# Patient Record
Sex: Female | Born: 1955 | Race: Black or African American | Hispanic: No | Smoking: Current every day smoker
Health system: Southern US, Community
[De-identification: ages and names within clinical notes are randomized; demographics above are authoritative.]

## PROBLEM LIST (undated history)

## (undated) DIAGNOSIS — D649 Anemia, unspecified: Secondary | ICD-10-CM

## (undated) DIAGNOSIS — R0600 Dyspnea, unspecified: Secondary | ICD-10-CM

## (undated) DIAGNOSIS — M1990 Unspecified osteoarthritis, unspecified site: Secondary | ICD-10-CM

## (undated) DIAGNOSIS — E119 Type 2 diabetes mellitus without complications: Secondary | ICD-10-CM

## (undated) DIAGNOSIS — I10 Essential (primary) hypertension: Secondary | ICD-10-CM

## (undated) DIAGNOSIS — E7800 Pure hypercholesterolemia, unspecified: Secondary | ICD-10-CM

## (undated) DIAGNOSIS — K759 Inflammatory liver disease, unspecified: Secondary | ICD-10-CM

## (undated) DIAGNOSIS — K219 Gastro-esophageal reflux disease without esophagitis: Secondary | ICD-10-CM

## (undated) DIAGNOSIS — T7840XA Allergy, unspecified, initial encounter: Secondary | ICD-10-CM

## (undated) DIAGNOSIS — Z0282 Encounter for adoption services: Secondary | ICD-10-CM

## (undated) DIAGNOSIS — J45909 Unspecified asthma, uncomplicated: Secondary | ICD-10-CM

## (undated) DIAGNOSIS — Z789 Other specified health status: Secondary | ICD-10-CM

## (undated) DIAGNOSIS — E213 Hyperparathyroidism, unspecified: Secondary | ICD-10-CM

## (undated) HISTORY — DX: Allergy, unspecified, initial encounter: T78.40XA

## (undated) HISTORY — PX: TUBAL LIGATION: SHX77

---

## 2017-05-27 DIAGNOSIS — K219 Gastro-esophageal reflux disease without esophagitis: Secondary | ICD-10-CM | POA: Insufficient documentation

## 2017-05-27 DIAGNOSIS — E66811 Obesity, class 1: Secondary | ICD-10-CM | POA: Insufficient documentation

## 2017-05-27 DIAGNOSIS — E1169 Type 2 diabetes mellitus with other specified complication: Secondary | ICD-10-CM | POA: Insufficient documentation

## 2017-05-27 DIAGNOSIS — Z6833 Body mass index (BMI) 33.0-33.9, adult: Secondary | ICD-10-CM | POA: Insufficient documentation

## 2017-05-27 DIAGNOSIS — E669 Obesity, unspecified: Secondary | ICD-10-CM | POA: Insufficient documentation

## 2017-05-27 DIAGNOSIS — I10 Essential (primary) hypertension: Secondary | ICD-10-CM | POA: Insufficient documentation

## 2017-05-27 DIAGNOSIS — J411 Mucopurulent chronic bronchitis: Secondary | ICD-10-CM | POA: Insufficient documentation

## 2017-05-27 DIAGNOSIS — E119 Type 2 diabetes mellitus without complications: Secondary | ICD-10-CM | POA: Insufficient documentation

## 2017-07-12 ENCOUNTER — Other Ambulatory Visit: Payer: Self-pay

## 2017-07-12 ENCOUNTER — Encounter (HOSPITAL_COMMUNITY): Payer: Self-pay | Admitting: *Deleted

## 2017-07-12 ENCOUNTER — Ambulatory Visit (INDEPENDENT_AMBULATORY_CARE_PROVIDER_SITE_OTHER): Payer: Self-pay

## 2017-07-12 ENCOUNTER — Ambulatory Visit (HOSPITAL_COMMUNITY)
Admission: EM | Admit: 2017-07-12 | Discharge: 2017-07-12 | Disposition: A | Discharge status: Home / Self Care | Payer: Self-pay | Attending: Internal Medicine | Admitting: Internal Medicine

## 2017-07-12 DIAGNOSIS — R05 Cough: Secondary | ICD-10-CM

## 2017-07-12 DIAGNOSIS — R0602 Shortness of breath: Secondary | ICD-10-CM

## 2017-07-12 DIAGNOSIS — R0789 Other chest pain: Secondary | ICD-10-CM

## 2017-07-12 HISTORY — DX: Essential (primary) hypertension: I10

## 2017-07-12 HISTORY — DX: Unspecified asthma, uncomplicated: J45.909

## 2017-07-12 HISTORY — DX: Type 2 diabetes mellitus without complications: E11.9

## 2017-07-12 HISTORY — DX: Pure hypercholesterolemia, unspecified: E78.00

## 2017-07-12 MED ORDER — PREDNISONE 50 MG PO TABS: 50.0000 mg | ORAL_TABLET | Freq: Every day | ORAL | 0 refills | Status: DC

## 2017-07-12 MED ORDER — PREDNISONE 50 MG PO TABS: 50.0000 mg | ORAL_TABLET | Freq: Every day | ORAL | 0 refills | Status: AC

## 2017-07-12 MED ORDER — IPRATROPIUM-ALBUTEROL 0.5-2.5 (3) MG/3ML IN SOLN
RESPIRATORY_TRACT | Status: AC
  Filled 2017-07-12: qty 3

## 2017-07-12 MED ORDER — IPRATROPIUM-ALBUTEROL 0.5-2.5 (3) MG/3ML IN SOLN
3.0000 mL | Freq: Once | RESPIRATORY_TRACT | Status: AC
  Administered 2017-07-12: 3 mL via RESPIRATORY_TRACT

## 2017-07-12 MED ORDER — LEVOFLOXACIN 750 MG PO TABS: 750.0000 mg | ORAL_TABLET | Freq: Every day | ORAL | 0 refills | Status: DC

## 2017-07-12 MED ORDER — DOXYCYCLINE HYCLATE 100 MG PO CAPS: 100.0000 mg | ORAL_CAPSULE | Freq: Two times a day (BID) | ORAL | 0 refills | Status: DC

## 2017-07-12 NOTE — Discharge Instructions (Signed)
X-ray shows changes in your lungs that could be starting pneumonia.  Start doxycycline and prednisone as directed.  Continue albuterol treatment as directed.  If experiencing worsening symptoms, chest pain, more short of breath, leaning forward to breathe, weakness, dizziness, feeling like passing out, go to the emergency department for further evaluation.

## 2017-07-12 NOTE — ED Provider Notes (Signed)
[place]    CSN: [id number] Arrival date & time: 07/12/17  1740     History   Chief Complaint Chief Complaint  Patient presents with  . Shortness of Breath  . Cough    HPI Megan [name] is a 62 y.o. female.   62 year old female with history of asthma, DM, HLD, HTN comes in for 3-day history of cough, chest pain with cough, shortness of breath, poor appetite.  Has had body aches.  Rhinorrhea, nasal congestion.  Denies fever, chills, night sweats.  She has been trying to use her albuterol inhaler, but states she has not been able to take a deep enough breath to sufficiently get the medicine.  Denies chest pain at rest.  Denies swelling of the leg, orthopnea.  Denies current chest pain.  She had grunting breathing at triage, DuoNeb was started.  States symptoms has significantly improved.  She is a current everyday smoker for the past 30 years, 0.3-0.5 pack/day.     Past Medical History:  Diagnosis Date  . Asthma   . Diabetes mellitus without complication ([place])   . Hypercholesteremia   . Hypertension     There are no active problems to display for this patient.   History reviewed. No pertinent surgical history.  OB History   None      Home Medications    Prior to Admission medications   Medication Sig Start Date End Date Taking? Authorizing Provider  albuterol (PROVENTIL HFA;VENTOLIN HFA) 108 (90 Base) MCG/ACT inhaler Inhale into the lungs every 6 (six) hours as needed for wheezing or shortness of breath.   Yes [provider]  aspirin 81 MG chewable tablet Chew by mouth daily.   Yes [provider]  GLIMEPIRIDE PO Take by mouth.   Yes [provider]  UNKNOWN TO PATIENT HTN med and a couple I cannot think of   Yes [provider]  doxycycline (VIBRAMYCIN) 100 MG capsule Take 1 capsule (100 mg total) by mouth 2 (two) times daily. 07/12/17   Tasia Catchings, Brynnleigh Mcelwee V, PA-C  predniSONE (DELTASONE) 50 MG tablet Take 1 tablet (50 mg  total) by mouth daily for 5 days. 07/12/17 07/17/17  Ok Edwards, PA-C    Family History Family History  Problem Relation Age of Onset  . Cancer Mother   . Cancer Father   . Heart attack Father     Social History Social History   Tobacco Use  . Smoking status: Current Every Day Smoker    Types: Cigarettes  . Smokeless tobacco: Never Used  Substance Use Topics  . Alcohol use: Yes    Alcohol/week: 8.4 oz    Types: 14 Shots of liquor per week  . Drug use: Never     Allergies   Peanut butter flavor   Review of Systems Review of Systems  Reason unable to perform ROS: See HPI as above.     Physical Exam Triage Vital Signs ED Triage Vitals [07/12/17 1746]  Enc Vitals Group     BP (!) 170/101     Pulse Rate (!) 119     Resp (!) 28     Temp 98.1 F (36.7 C)     Temp Source Oral     SpO2 95 %     Weight      Height      Head Circumference      Peak Flow      Pain Score 5     Pain Loc  Pain Edu?      Excl. in [place]?    No data found.  Updated Vital Signs BP 110/68   Pulse (!) 120   Temp 98.1 F (36.7 C) (Oral)   Resp (!) 24   SpO2 94%   Physical Exam  Constitutional: She is oriented to person, place, and time. She appears well-developed and well-nourished.  Non-toxic appearance. She does not appear ill. No distress.  HENT:  Head: Normocephalic and atraumatic.  Right Ear: Tympanic membrane, external ear and ear canal normal. Tympanic membrane is not erythematous and not bulging.  Left Ear: Tympanic membrane, external ear and ear canal normal. Tympanic membrane is not erythematous and not bulging.  Nose: Nose normal. Right sinus exhibits no maxillary sinus tenderness and no frontal sinus tenderness. Left sinus exhibits no maxillary sinus tenderness and no frontal sinus tenderness.  Mouth/Throat: Uvula is midline, oropharynx is clear and moist and mucous membranes are normal.  Eyes: Pupils are equal, round, and reactive to light. Conjunctivae are normal.    Neck: Normal range of motion. Neck supple.  Cardiovascular: Normal rate, regular rhythm and normal heart sounds. Exam reveals no gallop and no friction rub.  No murmur heard. Pulmonary/Chest: Effort normal. No accessory muscle usage. No respiratory distress.  Short shallow breaths without obvious wheezing, rales, rhonchi.  Musculoskeletal:       Right lower leg: She exhibits no edema.       Left lower leg: She exhibits no edema.  Lymphadenopathy:    She has no cervical adenopathy.  Neurological: She is alert and oriented to person, place, and time.  Skin: Skin is warm and dry.  Psychiatric: She has a normal mood and affect. Her behavior is normal. Judgment normal.     UC Treatments / Results  Labs (all labs ordered are listed, but only abnormal results are displayed) Labs Reviewed - No data to display  EKG None Radiology Dg Chest 2 View  Result Date: 07/12/2017 CLINICAL DATA:  Patient with productive crawl. Green sputum. Shortness of breath. EXAM: CHEST - 2 VIEW COMPARISON:  None. FINDINGS: Cardiac contours upper limits of normal. Tortuosity of the thoracic aorta. Bibasilar heterogeneous pulmonary opacities. No pleural effusion or pneumothorax. Thoracic spine degenerative changes. IMPRESSION: Basilar heterogeneous opacities favored to represent atelectasis. Infection not excluded. Electronically Signed   By: Lovey Newcomer M.D.   On: 07/12/2017 18:51    Procedures Procedures (including critical care time)  Medications Ordered in UC Medications  ipratropium-albuterol (DUONEB) 0.5-2.5 (3) MG/3ML nebulizer solution 3 mL (3 mLs Nebulization Given 07/12/17 1759)  ipratropium-albuterol (DUONEB) 0.5-2.5 (3) MG/3ML nebulizer solution 3 mL (3 mLs Nebulization Given 07/12/17 1846)     Initial Impression / Assessment and Plan / UC Course  I have reviewed the triage vital signs and the nursing notes.  Pertinent labs & imaging results that were available during my care of the patient were  reviewed by me and considered in my medical decision making (see chart for details).    Chest x-ray results discussed with patient.  Patient with much improved symptoms after 2 rounds of DuoNeb.  Deeper breathing, lungs remain clear to auscultation bilaterally without obvious adventitious lung sounds.  Patient speaking in full sentences without any accessory muscle use.  Patient continues to be tachycardic and slightly tachypneic, although with improvement of tachypnea after DuoNeb.  Discussed possible continued tachycardia with DuoNeb.  EKG shows sinus tachycardia without obvious ST changes.  No prior EKGs to compare to.  Patient currently without chest pain.  Given smoking history, will treat for COPD exacerbation with doxycycline and prednisone.  Continue albuterol treatments at home.  Strict return precautions given.  Patient expresses understanding and agrees to plan.  Final Clinical Impressions(s) / UC Diagnoses   Final diagnoses:  Shortness of breath    ED Discharge Orders        Ordered    levofloxacin (LEVAQUIN) 750 MG tablet  Daily,   Status:  Discontinued     07/12/17 1908    doxycycline (VIBRAMYCIN) 100 MG capsule  2 times daily,   Status:  Discontinued     07/12/17 1911    predniSONE (DELTASONE) 50 MG tablet  Daily,   Status:  Discontinued     07/12/17 1911    doxycycline (VIBRAMYCIN) 100 MG capsule  2 times daily     07/12/17 1916    predniSONE (DELTASONE) 50 MG tablet  Daily     07/12/17 1916        Ok Edwards, Hershal Coria 07/12/17 2038

## 2017-07-12 NOTE — ED Notes (Signed)
Pt continues with grunting breathing, but states her breathing feels much better.  Pt spitting up large amounts phlegm.

## 2017-07-12 NOTE — ED Triage Notes (Signed)
C/O cough, chest pain, SOB, poor appetite x 2.5 days ago.  C/o body aches.  Pt with grunting breathing.

## 2018-01-19 DIAGNOSIS — F1010 Alcohol abuse, uncomplicated: Secondary | ICD-10-CM | POA: Insufficient documentation

## 2018-01-19 DIAGNOSIS — E782 Mixed hyperlipidemia: Secondary | ICD-10-CM | POA: Insufficient documentation

## 2018-01-19 DIAGNOSIS — E213 Hyperparathyroidism, unspecified: Secondary | ICD-10-CM | POA: Insufficient documentation

## 2018-03-11 ENCOUNTER — Encounter (HOSPITAL_COMMUNITY): Payer: Self-pay

## 2018-03-11 ENCOUNTER — Ambulatory Visit (HOSPITAL_COMMUNITY)
Admission: EM | Admit: 2018-03-11 | Discharge: 2018-03-11 | Disposition: A | Discharge status: Home / Self Care | Payer: Self-pay | Attending: Family Medicine | Admitting: Family Medicine

## 2018-03-11 ENCOUNTER — Ambulatory Visit (INDEPENDENT_AMBULATORY_CARE_PROVIDER_SITE_OTHER): Payer: Self-pay

## 2018-03-11 DIAGNOSIS — R05 Cough: Secondary | ICD-10-CM

## 2018-03-11 DIAGNOSIS — F17210 Nicotine dependence, cigarettes, uncomplicated: Secondary | ICD-10-CM

## 2018-03-11 DIAGNOSIS — J441 Chronic obstructive pulmonary disease with (acute) exacerbation: Secondary | ICD-10-CM | POA: Insufficient documentation

## 2018-03-11 DIAGNOSIS — R062 Wheezing: Secondary | ICD-10-CM

## 2018-03-11 MED ORDER — HYDROCODONE-HOMATROPINE 5-1.5 MG/5ML PO SYRP: 5.0000 mL | ORAL_SOLUTION | Freq: Four times a day (QID) | ORAL | 0 refills | Status: DC | PRN

## 2018-03-11 MED ORDER — PREDNISONE 50 MG PO TABS: ORAL_TABLET | ORAL | 1 refills | Status: DC

## 2018-03-11 MED ORDER — IPRATROPIUM-ALBUTEROL 0.5-2.5 (3) MG/3ML IN SOLN
RESPIRATORY_TRACT | Status: AC
  Filled 2018-03-11: qty 3

## 2018-03-11 MED ORDER — IPRATROPIUM-ALBUTEROL 0.5-2.5 (3) MG/3ML IN SOLN
3.0000 mL | Freq: Once | RESPIRATORY_TRACT | Status: AC
  Administered 2018-03-11: 3 mL via RESPIRATORY_TRACT

## 2018-03-11 MED ORDER — LIDOCAINE HCL (PF) 1 % IJ SOLN
INTRAMUSCULAR | Status: AC
Start: 2018-03-11 — End: 2018-03-11
  Filled 2018-03-11: qty 2

## 2018-03-11 MED ORDER — IPRATROPIUM-ALBUTEROL 0.5-2.5 (3) MG/3ML IN SOLN: 3.0000 mL | RESPIRATORY_TRACT | 1 refills | Status: DC | PRN

## 2018-03-11 MED ORDER — CEFTRIAXONE SODIUM 1 G IJ SOLR
1.0000 g | Freq: Once | INTRAMUSCULAR | Status: AC
  Administered 2018-03-11: 1 g via INTRAMUSCULAR

## 2018-03-11 MED ORDER — CEFTRIAXONE SODIUM 1 G IJ SOLR
INTRAMUSCULAR | Status: AC
  Filled 2018-03-11: qty 10

## 2018-03-11 MED ORDER — AMOXICILLIN 875 MG PO TABS: 875.0000 mg | ORAL_TABLET | Freq: Two times a day (BID) | ORAL | 0 refills | Status: DC

## 2018-03-11 NOTE — ED Triage Notes (Signed)
Pt presents with persistent productive cough, with some back pain and rib pain.

## 2018-03-11 NOTE — ED Notes (Signed)
Patient able to ambulate independently at baseline  

## 2018-03-11 NOTE — Discharge Instructions (Signed)
You've smoked your last cigarette.  Give your lungs a chance to work.

## 2018-03-11 NOTE — ED Notes (Signed)
Returned from xray

## 2018-03-11 NOTE — ED Provider Notes (Signed)
[place]    CSN: [id number] Arrival date & time: 03/11/18  1553     History   Chief Complaint Chief Complaint  Patient presents with  . Cough  . Back Pain  . Fatigue    HPI Megan [name] is a 62 y.o. female.   This is a 62 year old woman who comes to the [facility] urgent care has an established patient with complaints of persistent productive cough, with some back pain and rib pain.  She is short of breath.  She is a smoker.  She is visiting her daughter from [place].  Patient has had neither flu shot no pneumonia shot.     Past Medical History:  Diagnosis Date  . Asthma   . Diabetes mellitus without complication ([place])   . Hypercholesteremia   . Hypertension     There are no active problems to display for this patient.   History reviewed. No pertinent surgical history.  OB History   No obstetric history on file.      Home Medications    Prior to Admission medications   Medication Sig Start Date End Date Taking? Authorizing Provider  albuterol (PROVENTIL HFA;VENTOLIN HFA) 108 (90 Base) MCG/ACT inhaler Inhale into the lungs every 6 (six) hours as needed for wheezing or shortness of breath.    [provider]  amoxicillin (AMOXIL) 875 MG tablet Take 1 tablet (875 mg total) by mouth 2 (two) times daily. 03/11/18   Robyn Haber, MD  aspirin 81 MG chewable tablet Chew by mouth daily.    [provider]  GLIMEPIRIDE PO Take by mouth.    [provider]  HYDROcodone-homatropine (HYDROMET) 5-1.5 MG/5ML syrup Take 5 mLs by mouth every 6 (six) hours as needed for cough. 03/11/18   Robyn Haber, MD  ipratropium-albuterol (DUONEB) 0.5-2.5 (3) MG/3ML SOLN Take 3 mLs by nebulization every 4 (four) hours as needed. 03/11/18   Robyn Haber, MD  predniSONE (DELTASONE) 50 MG tablet One daily with food 03/11/18   Robyn Haber, MD  UNKNOWN TO PATIENT HTN med and a couple I cannot think of    [provider]      Family History Family History  Problem Relation Age of Onset  . Cancer Mother   . Cancer Father   . Heart attack Father     Social History Social History   Tobacco Use  . Smoking status: Current Every Day Smoker    Types: Cigarettes  . Smokeless tobacco: Never Used  Substance Use Topics  . Alcohol use: Yes    Alcohol/week: 14.0 standard drinks    Types: 14 Shots of liquor per week  . Drug use: Never     Allergies   Peanut butter flavor   Review of Systems Review of Systems   Physical Exam Triage Vital Signs ED Triage Vitals  Enc Vitals Group     BP 03/11/18 1716 119/79     Pulse Rate 03/11/18 1716 (!) 105     Resp 03/11/18 1716 18     Temp 03/11/18 1716 98.4 F (36.9 C)     Temp Source 03/11/18 1716 Oral     SpO2 03/11/18 1716 92 %     Weight --      Height --      Head Circumference --      Peak Flow --      Pain Score 03/11/18 1715 8     Pain Loc --      Pain Edu? --  Excl. in [place]? --    No data found.  Updated Vital Signs BP 119/79 (BP Location: Right Arm)   Pulse (!) 105   Temp 98.4 F (36.9 C) (Oral)   Resp 18   SpO2 92%   Physical Exam Vitals signs and nursing note reviewed.  Constitutional:      General: She is in acute distress.     Appearance: Normal appearance. She is obese. She is ill-appearing.  HENT:     Head: Normocephalic.     Right Ear: Tympanic membrane and external ear normal.     Left Ear: Tympanic membrane and external ear normal.     Nose: Nose normal.     Mouth/Throat:     Mouth: Mucous membranes are moist.  Eyes:     Conjunctiva/sclera: Conjunctivae normal.     Pupils: Pupils are equal, round, and reactive to light.  Neck:     Musculoskeletal: Normal range of motion and neck supple.  Cardiovascular:     Rate and Rhythm: Tachycardia present.     Heart sounds: Normal heart sounds.  Pulmonary:     Effort: Respiratory distress present.     Breath sounds: Wheezing, rhonchi and rales present.   Musculoskeletal: Normal range of motion.  Skin:    General: Skin is warm and dry.  Neurological:     General: No focal deficit present.     Mental Status: She is alert and oriented to person, place, and time.  Psychiatric:        Mood and Affect: Mood normal.      UC Treatments / Results  Labs (all labs ordered are listed, but only abnormal results are displayed) Labs Reviewed - No data to display  EKG None  Radiology No results found.  Procedures Procedures (including critical care time)  Medications Ordered in UC Medications  ipratropium-albuterol (DUONEB) 0.5-2.5 (3) MG/3ML nebulizer solution 3 mL (has no administration in time range)  cefTRIAXone (ROCEPHIN) injection 1 g (has no administration in time range)    Initial Impression / Assessment and Plan / UC Course  I have reviewed the triage vital signs and the nursing notes.  Pertinent labs & imaging results that were available during my care of the patient were reviewed by me and considered in my medical decision making (see chart for details).    Final Clinical Impressions(s) / UC Diagnoses   Final diagnoses:  COPD exacerbation [facility])   Discharge Instructions   None    ED Prescriptions    Medication Sig Dispense Auth. Provider   predniSONE (DELTASONE) 50 MG tablet One daily with food 5 tablet Robyn Haber, MD   HYDROcodone-homatropine (HYDROMET) 5-1.5 MG/5ML syrup Take 5 mLs by mouth every 6 (six) hours as needed for cough. 60 mL Robyn Haber, MD   ipratropium-albuterol (DUONEB) 0.5-2.5 (3) MG/3ML SOLN Take 3 mLs by nebulization every 4 (four) hours as needed. 360 mL Robyn Haber, MD   amoxicillin (AMOXIL) 875 MG tablet Take 1 tablet (875 mg total) by mouth 2 (two) times daily. 20 tablet Robyn Haber, MD     Controlled Substance Prescriptions [place] Controlled Substance Registry consulted? Not Applicable   Robyn Haber, MD 03/11/18 1750

## 2019-03-18 DIAGNOSIS — M545 Low back pain, unspecified: Secondary | ICD-10-CM | POA: Insufficient documentation

## 2019-11-14 ENCOUNTER — Telehealth: Payer: Self-pay

## 2019-11-14 NOTE — Telephone Encounter (Signed)
Pt left v/m that she has appt on 12/26/19 to establish care with Glenda Chroman FNP. Pt request cb with address for [facility]. Gave pt [place] [phone]. Nothing further needed at this time.

## 2019-12-12 LAB — HEMOGLOBIN A1C: Hemoglobin A1C: 5.8

## 2019-12-12 LAB — MICROALBUMIN, URINE: Microalb, Ur: NEGATIVE

## 2019-12-26 ENCOUNTER — Ambulatory Visit: Payer: Self-pay | Admitting: Family Medicine

## 2020-02-15 ENCOUNTER — Ambulatory Visit (INDEPENDENT_AMBULATORY_CARE_PROVIDER_SITE_OTHER): Payer: Medicare Other | Admitting: Family Medicine

## 2020-02-15 ENCOUNTER — Other Ambulatory Visit: Payer: Self-pay

## 2020-02-15 ENCOUNTER — Encounter: Payer: Self-pay | Admitting: Family Medicine

## 2020-02-15 VITALS — BP 138/74 | HR 102 | Temp 98.5°F | Ht 66.0 in | Wt 184.4 lb

## 2020-02-15 DIAGNOSIS — R7989 Other specified abnormal findings of blood chemistry: Secondary | ICD-10-CM

## 2020-02-15 DIAGNOSIS — F419 Anxiety disorder, unspecified: Secondary | ICD-10-CM

## 2020-02-15 DIAGNOSIS — Z114 Encounter for screening for human immunodeficiency virus [HIV]: Secondary | ICD-10-CM

## 2020-02-15 DIAGNOSIS — Z1211 Encounter for screening for malignant neoplasm of colon: Secondary | ICD-10-CM

## 2020-02-15 DIAGNOSIS — Z7689 Persons encountering health services in other specified circumstances: Secondary | ICD-10-CM

## 2020-02-15 DIAGNOSIS — F1010 Alcohol abuse, uncomplicated: Secondary | ICD-10-CM

## 2020-02-15 DIAGNOSIS — E7800 Pure hypercholesterolemia, unspecified: Secondary | ICD-10-CM

## 2020-02-15 DIAGNOSIS — F32A Depression, unspecified: Secondary | ICD-10-CM

## 2020-02-15 DIAGNOSIS — E2839 Other primary ovarian failure: Secondary | ICD-10-CM

## 2020-02-15 DIAGNOSIS — E119 Type 2 diabetes mellitus without complications: Secondary | ICD-10-CM

## 2020-02-15 LAB — COMPREHENSIVE METABOLIC PANEL
ALT: 40 U/L — ABNORMAL HIGH (ref 0–35)
AST: 104 U/L — ABNORMAL HIGH (ref 0–37)
Albumin: 4.2 g/dL (ref 3.5–5.2)
Alkaline Phosphatase: 367 U/L — ABNORMAL HIGH (ref 39–117)
BUN: 10 mg/dL (ref 6–23)
CO2: 29 mEq/L (ref 19–32)
Calcium: 12 mg/dL — ABNORMAL HIGH (ref 8.4–10.5)
Chloride: 96 mEq/L (ref 96–112)
Creatinine, Ser: 0.75 mg/dL (ref 0.40–1.20)
GFR: 83.55 mL/min (ref >60.00)
Glucose, Bld: 218 mg/dL — ABNORMAL HIGH (ref 70–99)
Potassium: 4 mEq/L (ref 3.5–5.1)
Sodium: 134 mEq/L — ABNORMAL LOW (ref 135–145)
Total Bilirubin: 0.7 mg/dL (ref 0.2–1.2)
Total Protein: 8.6 g/dL — ABNORMAL HIGH (ref 6.0–8.3)

## 2020-02-15 LAB — CBC WITH DIFFERENTIAL/PLATELET
Basophils Absolute: 0.1 10*3/uL (ref 0.0–0.1)
Basophils Relative: 0.7 % (ref 0.0–3.0)
Eosinophils Absolute: 0.1 10*3/uL (ref 0.0–0.7)
Eosinophils Relative: 0.5 % (ref 0.0–5.0)
HCT: 38.4 % (ref 36.0–46.0)
Hemoglobin: 12.8 g/dL (ref 12.0–15.0)
Lymphocytes Relative: 17.4 % (ref 12.0–46.0)
Lymphs Abs: 2 10*3/uL (ref 0.7–4.0)
MCHC: 33.4 g/dL (ref 30.0–36.0)
MCV: 108.8 fl — ABNORMAL HIGH (ref 78.0–100.0)
Monocytes Absolute: 0.7 10*3/uL (ref 0.1–1.0)
Monocytes Relative: 6.2 % (ref 3.0–12.0)
Neutro Abs: 8.8 10*3/uL — ABNORMAL HIGH (ref 1.4–7.7)
Neutrophils Relative %: 75.2 % (ref 43.0–77.0)
Platelets: 272 10*3/uL (ref 150.0–400.0)
RBC: 3.53 Mil/uL — ABNORMAL LOW (ref 3.87–5.11)
RDW: 15.5 % (ref 11.5–15.5)
WBC: 11.7 10*3/uL — ABNORMAL HIGH (ref 4.0–10.5)

## 2020-02-15 LAB — LIPID PANEL
Cholesterol: 202 mg/dL — ABNORMAL HIGH (ref 0–200)
HDL: 72.9 mg/dL (ref >39.00)
LDL Cholesterol: 91 mg/dL (ref 0–99)
NonHDL: 129.5
Total CHOL/HDL Ratio: 3
Triglycerides: 191 mg/dL — ABNORMAL HIGH (ref 0.0–149.0)
VLDL: 38.2 mg/dL (ref 0.0–40.0)

## 2020-02-15 MED ORDER — DAPAGLIFLOZIN PROPANEDIOL 5 MG PO TABS: 5.0000 mg | ORAL_TABLET | Freq: Every day | ORAL | 1 refills | Status: DC

## 2020-02-15 NOTE — Progress Notes (Signed)
Subjective:    Patient ID: Megan [name], female    DOB: [birth year], 64 y.o.   MRN: [id number]  HPI Chief Complaint  Patient presents with  . New Patient (Initial Visit)    \   This is a 64 yo female who presents today to establish care. She is not working, lives with her husband, daughter, SIL, grandson. Enjoys scratch offs, watching comedies, reading.   Last CPE- more than 1 year ago Mammo- 01/25/2019 Pap- 07/28/2017 Colonoscopy- never, has done stool studies Tdap- unknown, declines in office due to non coverage Flu- Covid 19 vaccine- vacinated Eye- overdue Dental- overdue Exercise- none ETOH- drinking daily, over a pint of vodka daily. History of substance abuse, stopped 12 years ago. Husband is an alcoholic. Unhappy in her marriage. Went to rehab for crack cocaine. Lost her jobs. Depressed more than anxious. Denies physical or emotional abuse.  Sleep- poor sleep, goes to bed early, wakes up early, mind racing. Naps throughout the day.  Tobacco- 1/3 ppd    Review of Systems Denies chest pain, SOB, abdominal pain, blood in BM or urine, dysuria, frequency, leg swelling    Objective:   Physical Exam Physical Exam  Constitutional: Oriented to person, place, and time. Appears well-developed and well-nourished.  HENT:  Head: Normocephalic and atraumatic.  Eyes: Conjunctivae are normal.  Neck: Normal range of motion. Neck supple.  Cardiovascular: Normal rate, regular rhythm and normal heart sounds.   Pulmonary/Chest: Effort normal and breath sounds normal.  Musculoskeletal: No lower extremity edema.   Neurological: Alert and oriented to person, place, and time.  Skin: Skin is warm and dry.  Psychiatric: Normal mood and affect. Behavior is normal. Judgment and thought content normal.  Vitals reviewed.   BP 138/74 (BP Location: Right Arm, Patient Position: Sitting, Cuff Size: Normal)   Pulse (!) 102   Temp 98.5 F (36.9 C) (Oral)   Ht 5\' 6"  (1.676 m)   Wt 184 lb 6.4  oz (83.6 kg)   SpO2 96%   BMI 29.76 kg/m  Depression screen [facility] 2/9 02/15/2020 02/15/2020  Decreased Interest 3 3  Down, Depressed, Hopeless 3 -  PHQ - 2 Score 6 3  Altered sleeping 3 -  Tired, decreased energy 2 2  Change in appetite 2 2  Feeling bad or failure about yourself  3 -  Trouble concentrating 0 -  Moving slowly or fidgety/restless 0 -  Suicidal thoughts 0 -  PHQ-9 Score 16 -       Assessment & Plan:  1. Encounter to establish care -Reviewed available records in EMR -Discussed overdue health maintenance.  She declines Tdap, pneumonia, flu vaccines today. -Provided her information to schedule bone density and mammogram  2. Anxiety and depression -Discussed patient's current situation with her and offered resources, referral to therapy, medication.  She was not interested in any of these at this time.  3. Alcohol abuse -Discussed her current alcohol usage and excessive nature.  Offered referral and resources.  She is interested in stopping alcohol abuse on her own.  Advised her to do this with medical supervision as stopping abruptly could be dangerous.  She is still in contemplative stage and has not come up with a plan to reduce usage or stop at this point.  Provided her with address of [facility] mental health urgent care in [place]  4. Type 2 diabetes mellitus without complication, without long-term current use of insulin (HCC) -Unfortunately, I did not get hemoglobin A1c with her labs today.  I will ask her to return for this.  In the meantime, we will have her continue her current medication. - dapagliflozin propanediol (FARXIGA) 5 MG TABS tablet; Take 1 tablet (5 mg total) by mouth daily before breakfast.  Dispense: 90 tablet; Refill: 1 - Comprehensive metabolic panel - CBC with Differential  5. Estrogen deficiency - DG Bone Density; Future  6. Screening for colon cancer -Discussed screening options and patient would like to pursue Cologuard screening -  Cologuard  7. Elevated LFTs -Noted on prior lab reports, discussed with patient, likely related to her excessive alcohol use.  Discussed effects on body in particular the liver. -Will start laboratory work-up - Comprehensive metabolic panel - Hepatitis C antibody - Hepatitis B surface antigen - Hepatitis B surface antibody,quantitative - Hepatitis B core antibody, IgM - Iron and TIBC - Lipid Panel  8. Screening for HIV without presence of risk factors - HIV Antibody (routine testing w rflx)  -Follow-up in 3 months  This visit occurred during the SARS-CoV-2 public health emergency.  Safety protocols were in place, including screening questions prior to the visit, additional usage of staff PPE, and extensive cleaning of exam room while observing appropriate contact time as indicated for disinfecting solutions.    Clarene Reamer, FNP-BC  [facility]  02/18/2020 11:38 AM

## 2020-02-15 NOTE — Patient Instructions (Addendum)
Good to see you today, please follow up in 3 months  You will get a colon cancer screening kit in the mail  Please call and schedule an appointment for screening mammogram and bone density study. A referral is not needed.  [facility]- [address] [employer] your Covid booster, wait at least 4 weeks, then can get Shingles vaccine at your pharmacy, I also advise pneumococcal vaccine- Prevnar 13   Alcohol Abuse and Dependence Information, Adult Alcohol is a widely available drug. People drink alcohol in different amounts. People who drink alcohol very often and in large amounts often have problems during and after drinking. They may develop what is called an alcohol use disorder. There are two main types of alcohol use disorders:  Alcohol abuse. This is when you use alcohol too much or too often. You may use alcohol to make yourself feel happy or to reduce stress. You may have a hard time setting a limit on the amount you drink.  Alcohol dependence. This is when you use alcohol consistently for a period of time, and your body changes as a result. This can make it hard to stop drinking because you may start to feel sick or feel different when you do not use alcohol. These symptoms are known as withdrawal. How can alcohol abuse and dependence affect me? Alcohol abuse and dependence can have a negative effect on your life. Drinking too much can lead to addiction. You may feel like you need alcohol to function normally. You may drink alcohol before work in the morning, during the day, or as soon as you get home from work in the evening. These actions can result in:  Poor work performance.  Job loss.  Financial problems.  Car crashes or criminal charges from driving after drinking alcohol.  Problems in your relationships with friends and family.  Losing the trust and respect of coworkers,  friends, and family. Drinking heavily over a long period of time can permanently damage your body and brain, and can cause lifelong health issues, such as:  Damage to your liver or pancreas.  Heart problems, high blood pressure, or stroke.  Certain cancers.  Decreased ability to fight infections.  Brain or nerve damage.  Depression.  Early (premature) death. If you are careless or you crave alcohol, it is easy to drink more than your body can handle (overdose). Alcohol overdose is a serious situation that requires hospitalization. It may lead to permanent injuries or death. What can increase my risk?  Having a family history of alcohol abuse.  Having depression or other mental health conditions.  Beginning to drink at an early age.  Binge drinking often.  Experiencing trauma, stress, and an unstable home life during childhood.  Spending time with people who drink often. What actions can I take to prevent or manage alcohol abuse and dependence?  Do not drink alcohol if: ? Your health care provider tells you not to drink. ? You are pregnant, may be pregnant, or are planning to become pregnant.  If you drink alcohol: ? Limit how much you use to:  0-1 drink a day for women.  0-2 drinks a day for men. ? Be aware of how much alcohol is in your drink. In the [place] one drink equals one 12 oz bottle of beer (355 mL), one 5 oz glass of wine (148 mL), or one 1 oz glass of hard liquor (  44 mL).  Stop drinking if you have been drinking too much. This can be very hard to do if you are used to abusing alcohol. If you begin to have withdrawal symptoms, talk with your health care provider or a person that you trust. These symptoms may include anxiety, shaky hands, headache, nausea, sweating, or not being able to sleep.  Choose to drink nonalcoholic beverages in social gatherings and places where there may be alcohol. Activity  Spend more time on activities that you enjoy that do not  involve alcohol, like hobbies or exercise.  Find healthy ways to cope with stress, such as exercise, meditation, or spending time with people you care about. General information  Talk to your family, coworkers, and friends about supporting you in your efforts to stop drinking. If they drink, ask them not to drink around you. Spend more time with people who do not drink alcohol.  If you think that you have an alcohol dependency problem: ? Tell friends or family about your concerns. ? Talk with your health care provider or another health professional about where to get help. ? Work with a Transport planner and a Regulatory affairs officer. ? Consider joining a support group for people who struggle with alcohol abuse and dependence. Where to find support   Your health care provider.  SMART Recovery: www.smartrecovery.org Therapy and support groups  Local treatment centers or chemical dependency counselors.  Local AA groups in your community: NicTax.com.pt Where to find more information  Centers for Disease Control and Prevention: http://www.wolf.info/  National Institute on Alcohol Abuse and Alcoholism: http://www.bradshaw.com/  Alcoholics Anonymous (AA): NicTax.com.pt Contact a health care provider if:  You drank more or for longer than you intended on more than one occasion.  You tried to stop drinking or to cut back on how much you drink, but you were not able to.  You often drink to the point of vomiting or passing out.  You want to drink so badly that you cannot think about anything else.  You have problems in your life due to drinking, but you continue to drink.  You keep drinking even though you feel anxious, depressed, or have experienced memory loss.  You have stopped doing the things you used to enjoy in order to drink.  You have to drink more than you used to in order to get the effect you want.  You experience anxiety, sweating, nausea, shakiness, and trouble sleeping when you try to  stop drinking. Get help right away if:  You have thoughts about hurting yourself or others.  You have serious withdrawal symptoms, including: ? Confusion. ? Racing heart. ? High blood pressure. ? Fever. If you ever feel like you may hurt yourself or others, or have thoughts about taking your own life, get help right away. You can go to your nearest emergency department or call:  Your local emergency services (911 in the [place]  A suicide crisis helpline, such as the [place] at 450 157 4984. This is open 24 hours a day. Summary  Alcohol abuse and dependence can have a negative effect on your life. Drinking too much or too often can lead to addiction.  If you drink alcohol, limit how much you use.  If you are having trouble keeping your drinking under control, find ways to change your behavior. Hobbies, calming activities, exercise, or support groups can help.  If you feel you need help with changing your drinking habits, talk with your health care provider, a good  friend, or a therapist, or go to an [facility] group. This information is not intended to replace advice given to you by your health care provider. Make sure you discuss any questions you have with your health care provider. Document Revised: 06/22/2018 Document Reviewed: 05/11/2018 Elsevier Patient Education  [place].

## 2020-02-16 LAB — IRON, TOTAL/TOTAL IRON BINDING CAP
%SAT: 12 % (calc) — ABNORMAL LOW (ref 16–45)
Iron: 61 ug/dL (ref 45–160)
TIBC: 494 mcg/dL (calc) — ABNORMAL HIGH (ref 250–450)

## 2020-02-16 LAB — HEPATITIS B CORE ANTIBODY, IGM: Hep B C IgM: NONREACTIVE

## 2020-02-16 LAB — HEPATITIS C ANTIBODY
Hepatitis C Ab: NONREACTIVE
SIGNAL TO CUT-OFF: 0.56 (ref <1.00)

## 2020-02-16 LAB — HIV ANTIBODY (ROUTINE TESTING W REFLEX): HIV 1&2 Ab, 4th Generation: NONREACTIVE

## 2020-02-16 LAB — HEPATITIS B SURFACE ANTIGEN: Hepatitis B Surface Ag: NONREACTIVE

## 2020-02-16 LAB — HEPATITIS B SURFACE ANTIBODY, QUANTITATIVE: Hepatitis B-Post: <5 m[IU]/mL — ABNORMAL LOW (ref >=10)

## 2020-02-18 ENCOUNTER — Telehealth: Payer: Self-pay | Admitting: Family Medicine

## 2020-02-18 NOTE — Telephone Encounter (Signed)
Please call patient and tell her that I forgot to order her diabetes test, hemoglobin A1c with her blood work last week.  I have put in the order, please ask her to schedule a lab only visit to check this.  There is also a result note that can be completed at the same time as this call.

## 2020-02-20 NOTE — Telephone Encounter (Signed)
Lab appointment scheduled for 02/24/2020 at 0915.

## 2020-02-21 ENCOUNTER — Telehealth: Payer: Self-pay | Admitting: Family Medicine

## 2020-02-21 NOTE — Telephone Encounter (Signed)
Patient called stating she missed a call no record of who called EM

## 2020-02-24 ENCOUNTER — Other Ambulatory Visit: Payer: Self-pay

## 2020-02-24 ENCOUNTER — Other Ambulatory Visit (INDEPENDENT_AMBULATORY_CARE_PROVIDER_SITE_OTHER): Payer: Medicare Other

## 2020-02-24 DIAGNOSIS — E119 Type 2 diabetes mellitus without complications: Secondary | ICD-10-CM

## 2020-02-24 LAB — POCT GLYCOSYLATED HEMOGLOBIN (HGB A1C): Hemoglobin A1C: 5.4 % (ref 4.0–5.6)

## 2020-02-27 MED ORDER — ATORVASTATIN CALCIUM 40 MG PO TABS: 40.0000 mg | ORAL_TABLET | Freq: Every day | ORAL | 1 refills | Status: DC

## 2020-02-27 NOTE — Addendum Note (Signed)
Addended by: Clarene Reamer B on: 02/27/2020 06:31 PM   Modules accepted: Orders

## 2020-03-17 DIAGNOSIS — K7460 Unspecified cirrhosis of liver: Secondary | ICD-10-CM

## 2020-03-17 HISTORY — DX: Unspecified cirrhosis of liver: K74.60

## 2020-05-17 ENCOUNTER — Ambulatory Visit (INDEPENDENT_AMBULATORY_CARE_PROVIDER_SITE_OTHER): Payer: Medicare Other | Admitting: Family Medicine

## 2020-05-17 ENCOUNTER — Other Ambulatory Visit: Payer: Self-pay

## 2020-05-17 ENCOUNTER — Telehealth: Payer: Self-pay | Admitting: Family Medicine

## 2020-05-17 ENCOUNTER — Encounter: Payer: Self-pay | Admitting: Family Medicine

## 2020-05-17 VITALS — BP 100/60 | HR 88 | Temp 97.5°F | Ht 66.0 in | Wt 182.8 lb

## 2020-05-17 DIAGNOSIS — I10 Essential (primary) hypertension: Secondary | ICD-10-CM | POA: Diagnosis not present

## 2020-05-17 DIAGNOSIS — K219 Gastro-esophageal reflux disease without esophagitis: Secondary | ICD-10-CM

## 2020-05-17 DIAGNOSIS — E782 Mixed hyperlipidemia: Secondary | ICD-10-CM | POA: Diagnosis not present

## 2020-05-17 DIAGNOSIS — R160 Hepatomegaly, not elsewhere classified: Secondary | ICD-10-CM

## 2020-05-17 DIAGNOSIS — E119 Type 2 diabetes mellitus without complications: Secondary | ICD-10-CM

## 2020-05-17 DIAGNOSIS — F1010 Alcohol abuse, uncomplicated: Secondary | ICD-10-CM

## 2020-05-17 DIAGNOSIS — J411 Mucopurulent chronic bronchitis: Secondary | ICD-10-CM | POA: Diagnosis not present

## 2020-05-17 DIAGNOSIS — Z1211 Encounter for screening for malignant neoplasm of colon: Secondary | ICD-10-CM

## 2020-05-17 MED ORDER — OMEPRAZOLE 20 MG PO CPDR: 20.0000 mg | DELAYED_RELEASE_CAPSULE | Freq: Every day | ORAL | 0 refills | Status: DC

## 2020-05-17 NOTE — Patient Instructions (Signed)
Would recommend reducing alcohol  If you experience withdrawal symptoms -- call immediately  Start Omeprazole 20 mg to help with low appetite and pain with eating  Get liver ultrasound and referral to GI  #Referral I have placed a referral to a specialist for you. You should receive a phone call from the specialty office. Make sure your voicemail is not full and that if you are able to answer your phone to unknown or new numbers.   It may take up to 2 weeks to hear about the referral. If you do not hear anything in 2 weeks, please call our office and ask to speak with the referral coordinator.

## 2020-05-17 NOTE — Assessment & Plan Note (Addendum)
Diet controlled. Recheck at next visit.

## 2020-05-17 NOTE — Assessment & Plan Note (Signed)
Controlled. Cont lisinopril 20 mg.  

## 2020-05-17 NOTE — Assessment & Plan Note (Signed)
Suspect this may be contributing to her decreased appetite. Trial of omeprazole. Encouraged reducing alcohol consumption as likely worsening.

## 2020-05-17 NOTE — Telephone Encounter (Signed)
Pt called in she was in earlier today and Dr. Einar Pheasant stated that she had to wait until 3 month to the date to get her A1C checked.

## 2020-05-17 NOTE — Assessment & Plan Note (Signed)
Hepatomegaly and elevated LFTs in setting of alcohol use. This has gotten worse per patient and drinking 20 oz per day. [place] to evaluated. Encouraged cessation and discussed risk for withdrawal.

## 2020-05-17 NOTE — Progress Notes (Signed)
Subjective:     Megan [name] is a 65 y.o. female presenting for Transitions Of Care and Follow-up (3 month- loss of appetite. Drinks alcohol instead of eating. /Was told to get Hep vaccines)     HPI   #alcohol abuse - 1/2 gal of vodka over 3 days -- 64 oz of vodka total - roughly 20 ounces of alcohol daily - has increased since her appointment  - no need for eye open - no hx of withdrawal symptoms - wants to stop alcohol and knows she needs to but is torn  - has been worse over the last 5-6 years due to husband's influence  Husband is also an alcoholic Significant marital stress  #Low appetite - spaghetti - oatmeal - not healthy at all - 3 freezers - but nothing she wants to eat - anything she eats with small amount she will feel nauseous  - is noticing that she will forget about food she puts in the microwave - keeps snacks by her bed   Review of Systems   Social History   Tobacco Use  Smoking Status Current Every Day Smoker   Packs/day: 0.25   Years: 50.00   Pack years: 12.50   Types: Cigarettes  Smokeless Tobacco Never Used        Objective:    BP Readings from Last 3 Encounters:  05/17/20 100/60  02/15/20 138/74  03/11/18 119/79   Wt Readings from Last 3 Encounters:  05/17/20 182 lb 12 oz (82.9 kg)  02/15/20 184 lb 6.4 oz (83.6 kg)    BP 100/60    Pulse 88    Temp (!) 97.5 F (36.4 C) (Temporal)    Ht 5\' 6"  (1.676 m)    Wt 182 lb 12 oz (82.9 kg)    SpO2 95%    BMI 29.50 kg/m    Physical Exam Constitutional:      General: She is not in acute distress.    Appearance: She is well-developed. She is not diaphoretic.  HENT:     Right Ear: External ear normal.     Left Ear: External ear normal.  Eyes:     Conjunctiva/sclera: Conjunctivae normal.  Cardiovascular:     Rate and Rhythm: Normal rate and regular rhythm.     Heart sounds: No murmur heard.   Pulmonary:     Effort: Pulmonary effort is normal. No respiratory distress.      Breath sounds: Normal breath sounds. No wheezing.  Abdominal:     General: Abdomen is flat. Bowel sounds are normal. There is no distension.     Palpations: Abdomen is soft. There is hepatomegaly.     Tenderness: There is no abdominal tenderness. There is no guarding or rebound.  Musculoskeletal:     Cervical back: Neck supple.  Skin:    General: Skin is warm and dry.     Capillary Refill: Capillary refill takes less than 2 seconds.  Neurological:     Mental Status: She is alert. Mental status is at baseline.  Psychiatric:        Mood and Affect: Mood normal.        Behavior: Behavior normal.           Assessment & Plan:   Problem List Items Addressed This Visit      Cardiovascular and Mediastinum   Essential hypertension - Primary    Controlled. Cont lisinopril 20 mg.         Respiratory   Mucopurulent chronic bronchitis (  [place])    Rare use of albuterol        Digestive   Gastroesophageal reflux disease without esophagitis    Suspect this may be contributing to her decreased appetite. Trial of omeprazole. Encouraged reducing alcohol consumption as likely worsening.       Relevant Medications   omeprazole (PRILOSEC) 20 MG capsule   Hepatomegaly    Will get [place] to evaluated. Likely 2/2 to alcohol abuse      Relevant Orders   US ABDOMEN LIMITED RUQ (LIVER/GB)     Endocrine   Type 2 diabetes mellitus without complication, without long-term current use of insulin (HCC)    Diet controlled. Recheck at next visit.         Other   Alcohol abuse    Hepatomegaly and elevated LFTs in setting of alcohol use. This has gotten worse per patient and drinking 20 oz per day. [place] to evaluated. Encouraged cessation and discussed risk for withdrawal.       Mixed hyperlipidemia    Lab Results  Component Value Date   CHOL 202 (H) 02/15/2020   HDL 72.90 02/15/2020   LDLCALC 91 02/15/2020   TRIG 191.0 (H) 02/15/2020   CHOLHDL 3 02/15/2020   Pt was increase from 20>40 mg,  but reports she mixed the prescriptions so is only intermittently taking 40 mg. Will plan to recheck in 6 months        Other Visit Diagnoses    Screening for colon cancer       Relevant Orders   Ambulatory referral to Gastroenterology       Return in about 4 weeks (around 06/14/2020).  Lesleigh Noe, MD  This visit occurred during the SARS-CoV-2 public health emergency.  Safety protocols were in place, including screening questions prior to the visit, additional usage of staff PPE, and extensive cleaning of exam room while observing appropriate contact time as indicated for disinfecting solutions.

## 2020-05-17 NOTE — Assessment & Plan Note (Signed)
Will get [place] to evaluated. Likely 2/2 to alcohol abuse

## 2020-05-17 NOTE — Assessment & Plan Note (Addendum)
Lab Results  Component Value Date   CHOL 202 (H) 02/15/2020   HDL 72.90 02/15/2020   LDLCALC 91 02/15/2020   TRIG 191.0 (H) 02/15/2020   CHOLHDL 3 02/15/2020   Pt was increase from 20>40 mg, but reports she mixed the prescriptions so is only intermittently taking 40 mg. Will plan to recheck in 6 months

## 2020-05-17 NOTE — Assessment & Plan Note (Signed)
- 

## 2020-05-18 ENCOUNTER — Telehealth: Payer: Self-pay

## 2020-05-18 NOTE — Telephone Encounter (Signed)
Faxed completed and signed medical clearance form to pt's dentist, along with a copy of pt's problem list, meds and allergies. Fax went through. Form sent to scan.

## 2020-05-24 ENCOUNTER — Other Ambulatory Visit: Payer: Medicare Other

## 2020-05-29 ENCOUNTER — Telehealth: Payer: Self-pay

## 2020-05-29 NOTE — Telephone Encounter (Signed)
Returned patients call. Pt had requested to cancel appt but there is no appt scheduled. Pt has a referral to our office. Transferred call to front desk to have patient put on triage appt.

## 2020-06-04 ENCOUNTER — Telehealth (INDEPENDENT_AMBULATORY_CARE_PROVIDER_SITE_OTHER): Payer: Self-pay | Admitting: Gastroenterology

## 2020-06-04 ENCOUNTER — Other Ambulatory Visit: Payer: Self-pay

## 2020-06-04 DIAGNOSIS — Z1211 Encounter for screening for malignant neoplasm of colon: Secondary | ICD-10-CM

## 2020-06-04 MED ORDER — NA SULFATE-K SULFATE-MG SULF 17.5-3.13-1.6 GM/177ML PO SOLN: 1.0000 | Freq: Once | ORAL | 0 refills | Status: AC

## 2020-06-04 NOTE — Progress Notes (Signed)
Gastroenterology Pre-Procedure Review  Request Date: Monday 06/18/20 Requesting Physician: Dr. Bonna Gains  PATIENT REVIEW QUESTIONS: The patient responded to the following health history questions as indicated:    1. Are you having any GI issues? no 2. Do you have a personal history of Polyps? no 3. Do you have a family history of Colon Cancer or Polyps? no 4. Diabetes Mellitus? yes (type 2 controlled diet) 5. Joint replacements in the past 12 months?no 6. Major health problems in the past 3 months?no 7. Any artificial heart valves, MVP, or defibrillator?no    MEDICATIONS & ALLERGIES:    Patient reports the following regarding taking any anticoagulation/antiplatelet therapy:   Plavix, Coumadin, Eliquis, Xarelto, Lovenox, Pradaxa, Brilinta, or Effient? no Aspirin? yes (81 mg daily)  Patient confirms/reports the following medications:  Current Outpatient Medications  Medication Sig Dispense Refill  . ACETAMINOPHEN PO Take by mouth.    Marland Kitchen albuterol (PROVENTIL HFA;VENTOLIN HFA) 108 (90 Base) MCG/ACT inhaler Inhale into the lungs every 6 (six) hours as needed for wheezing or shortness of breath.    Marland Kitchen aspirin 81 MG chewable tablet Chew by mouth daily.    Marland Kitchen atorvastatin (LIPITOR) 40 MG tablet Take 1 tablet (40 mg total) by mouth daily. 90 tablet 1  . B Complex Vitamins (VITAMIN B COMPLEX 100 IJ) Take by mouth.    Marland Kitchen BIOTIN PO Take by mouth.    Marland Kitchen CALCIUM PO Take by mouth.    . COLLAGEN PO Take by mouth.    . cyanocobalamin 1000 MCG tablet Take by mouth.    . cyclobenzaprine (FLEXERIL) 5 MG tablet TAKE ONE TABLET BY MOUTH NIGHTLY AS NEEDED FOR MUSCLE SPASMS    . Docusate Sodium (COLACE PO) Take by mouth.    Marland Kitchen lisinopril (ZESTRIL) 20 MG tablet Take 1 tablet by mouth daily.    . meloxicam (MOBIC) 15 MG tablet Take 15 mg by mouth daily.    . Na Sulfate-K Sulfate-Mg Sulf 17.5-3.13-1.6 GM/177ML SOLN Take 1 kit by mouth once for 1 dose. 354 mL 0  . omeprazole (PRILOSEC) 20 MG capsule Take 1  capsule (20 mg total) by mouth daily. 90 capsule 0  . Cholecalciferol 25 MCG (1000 UT) capsule Take by mouth. (Patient not taking: Reported on 06/04/2020)    . UNKNOWN TO PATIENT HTN med and a couple I cannot think of     No current facility-administered medications for this visit.    Patient confirms/reports the following allergies:  Allergies  Allergen Reactions  . Peanut Butter Flavor     No orders of the defined types were placed in this encounter.   AUTHORIZATION INFORMATION Primary Insurance: 1D#: Group #:  Secondary Insurance: 1D#: Group #:  SCHEDULE INFORMATION: Date: Monday 06/18/20 Time: Location:[facility]

## 2020-06-05 ENCOUNTER — Ambulatory Visit: Payer: Medicare Other

## 2020-06-08 ENCOUNTER — Other Ambulatory Visit: Payer: Medicare Other

## 2020-06-18 ENCOUNTER — Ambulatory Visit (INDEPENDENT_AMBULATORY_CARE_PROVIDER_SITE_OTHER): Payer: Medicare Other | Admitting: Family Medicine

## 2020-06-18 ENCOUNTER — Other Ambulatory Visit: Payer: Self-pay

## 2020-06-18 VITALS — BP 140/72 | HR 108 | Temp 97.3°F | Ht 66.0 in | Wt 179.0 lb

## 2020-06-18 DIAGNOSIS — F1010 Alcohol abuse, uncomplicated: Secondary | ICD-10-CM

## 2020-06-18 DIAGNOSIS — K219 Gastro-esophageal reflux disease without esophagitis: Secondary | ICD-10-CM | POA: Diagnosis not present

## 2020-06-18 DIAGNOSIS — J411 Mucopurulent chronic bronchitis: Secondary | ICD-10-CM | POA: Diagnosis not present

## 2020-06-18 DIAGNOSIS — E119 Type 2 diabetes mellitus without complications: Secondary | ICD-10-CM

## 2020-06-18 LAB — POCT GLYCOSYLATED HEMOGLOBIN (HGB A1C): Hemoglobin A1C: 5.8 % — AB (ref 4.0–5.6)

## 2020-06-18 NOTE — Assessment & Plan Note (Signed)
Lab Results  Component Value Date   HGBA1C 5.8 (A) 06/18/2020   Slightly worse, but still in prediabetes range. Encouraged healthy diet, alcohol cessation, exercise.

## 2020-06-18 NOTE — Patient Instructions (Addendum)
#  Breathing - continue using the inhaler as needed - try a daily allergy pill - like claritin, zyrtec, allegra  Great job cutting back on alcohol. Continue to work on this - it will help with your stomach issues

## 2020-06-18 NOTE — Progress Notes (Signed)
Subjective:     Megan [name] is a 65 y.o. female presenting for Diabetes (A1c and labs. Has not been checking blood sugars at home)     HPI   #Diabetes Currently taking diet controlled   Last HgbA1c:  Lab Results  Component Value Date   HGBA1C 5.8 (A) 06/18/2020    Diabetes Health Maintenance Due:    Diabetes Health Maintenance Due  Topic Date Due  . FOOT EXAM  Never done  . OPHTHALMOLOGY EXAM  Never done  . HEMOGLOBIN A1C  12/18/2020    #Hepatomegaly - was having issues with medical transportation - has her ultrasound tomorrow  #Alcohol use - has cut back - no longer drinking daily - sometimes will go 3-5 days without alcohol - will occasionally drink 2 shots - mood is overall good - has a lot of personal issues going on - needs to deal with with  - stomach issues are worse with heavy alcohol use   Review of Systems   Social History   Tobacco Use  Smoking Status Current Every Day Smoker  . Packs/day: 0.25  . Years: 50.00  . Pack years: 12.50  . Types: Cigarettes  Smokeless Tobacco Never Used        Objective:    BP Readings from Last 3 Encounters:  06/18/20 140/72  05/17/20 100/60  02/15/20 138/74   Wt Readings from Last 3 Encounters:  06/18/20 179 lb (81.2 kg)  05/17/20 182 lb 12 oz (82.9 kg)  02/15/20 184 lb 6.4 oz (83.6 kg)    BP 140/72   Pulse (!) 108   Temp (!) 97.3 F (36.3 C) (Temporal)   Ht 5\' 6"  (1.676 m)   Wt 179 lb (81.2 kg)   SpO2 96%   BMI 28.89 kg/m    Physical Exam Constitutional:      General: She is not in acute distress.    Appearance: She is well-developed. She is not diaphoretic.  HENT:     Right Ear: External ear normal.     Left Ear: External ear normal.     Nose: Nose normal.  Eyes:     Conjunctiva/sclera: Conjunctivae normal.  Cardiovascular:     Rate and Rhythm: Normal rate and regular rhythm.     Heart sounds: No murmur heard.   Pulmonary:     Effort: Pulmonary effort is normal. No  respiratory distress.     Breath sounds: Normal breath sounds. No wheezing.  Musculoskeletal:     Cervical back: Neck supple.  Skin:    General: Skin is warm and dry.     Capillary Refill: Capillary refill takes less than 2 seconds.  Neurological:     Mental Status: She is alert. Mental status is at baseline.  Psychiatric:        Mood and Affect: Mood normal.        Behavior: Behavior normal.           Assessment & Plan:   Problem List Items Addressed This Visit      Respiratory   Mucopurulent chronic bronchitis ([place])    Stable. Cont albuterol. Can try allergy medication - claritin/zrytec to help with allergy symptoms        Digestive   Gastroesophageal reflux disease without esophagitis    Improved with decreased alcohol consumption. Cont omperazole 20 mg        Endocrine   Type 2 diabetes mellitus without complication, without long-term current use of insulin ([place]) - Primary  Lab Results  Component Value Date   HGBA1C 5.8 (A) 06/18/2020   Slightly worse, but still in prediabetes range. Encouraged healthy diet, alcohol cessation, exercise.       Relevant Orders   POCT glycosylated hemoglobin (Hb A1C) (Completed)     Other   Alcohol abuse    Notes she has reduced use to drinking a few shots per day and has gone 3-5 day stretches w/o alcohol or withdrawal symptoms. Previous 20 oz liquor daily. Encouraged continued cessation efforts. Offered support if needed.           Return in about 3 months (around 09/19/2020).  Lesleigh Noe, MD  This visit occurred during the SARS-CoV-2 public health emergency.  Safety protocols were in place, including screening questions prior to the visit, additional usage of staff PPE, and extensive cleaning of exam room while observing appropriate contact time as indicated for disinfecting solutions.

## 2020-06-18 NOTE — Assessment & Plan Note (Signed)
Stable. Cont albuterol. Can try allergy medication - claritin/zrytec to help with allergy symptoms

## 2020-06-18 NOTE — Assessment & Plan Note (Signed)
Notes she has reduced use to drinking a few shots per day and has gone 3-5 day stretches w/o alcohol or withdrawal symptoms. Previous 20 oz liquor daily. Encouraged continued cessation efforts. Offered support if needed.

## 2020-06-18 NOTE — Assessment & Plan Note (Signed)
Improved with decreased alcohol consumption. Cont omperazole 20 mg

## 2020-06-19 ENCOUNTER — Ambulatory Visit
Admission: RE | Admit: 2020-06-19 | Discharge: 2020-06-19 | Disposition: A | Discharge status: Home / Self Care | Payer: Medicare Other | Source: Ambulatory Visit | Attending: Family Medicine | Admitting: Family Medicine

## 2020-06-19 DIAGNOSIS — R160 Hepatomegaly, not elsewhere classified: Secondary | ICD-10-CM

## 2020-06-20 ENCOUNTER — Telehealth: Payer: Self-pay | Admitting: Family Medicine

## 2020-06-20 DIAGNOSIS — D135 Benign neoplasm of extrahepatic bile ducts: Secondary | ICD-10-CM

## 2020-06-20 DIAGNOSIS — K769 Liver disease, unspecified: Secondary | ICD-10-CM

## 2020-06-20 NOTE — Telephone Encounter (Signed)
Discussed the results of the ultrasound and need for 3 month f/u  Pt notes some abdominal pain recently. Discussed watch and wait and will update if worsening.   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   Offered hepatology referral now vs later. Will wait for f/u imaging  Encouraged continued alcohol cessation.

## 2020-06-21 ENCOUNTER — Other Ambulatory Visit: Payer: Self-pay

## 2020-06-21 ENCOUNTER — Other Ambulatory Visit
Admission: RE | Admit: 2020-06-21 | Discharge: 2020-06-21 | Disposition: A | Discharge status: Home / Self Care | Payer: Medicare Other | Source: Ambulatory Visit | Attending: Gastroenterology | Admitting: Gastroenterology

## 2020-06-21 DIAGNOSIS — Z01812 Encounter for preprocedural laboratory examination: Secondary | ICD-10-CM | POA: Diagnosis present

## 2020-06-21 DIAGNOSIS — Z20822 Contact with and (suspected) exposure to covid-19: Secondary | ICD-10-CM | POA: Diagnosis not present

## 2020-06-21 LAB — SARS CORONAVIRUS 2 (TAT 6-24 HRS): SARS Coronavirus 2: NEGATIVE

## 2020-06-22 ENCOUNTER — Telehealth: Payer: Self-pay

## 2020-06-22 NOTE — Telephone Encounter (Signed)
Left message for patient to call me back to discuss her [place] appointment for 3 months follow up.

## 2020-06-25 ENCOUNTER — Encounter
Admission: RE | Disposition: A | Discharge status: Home / Self Care | Payer: Self-pay | Source: Home / Self Care | Attending: Gastroenterology

## 2020-06-25 ENCOUNTER — Encounter: Payer: Self-pay | Admitting: Gastroenterology

## 2020-06-25 ENCOUNTER — Ambulatory Visit
Admission: RE | Admit: 2020-06-25 | Discharge: 2020-06-25 | Disposition: A | Discharge status: Home / Self Care | Payer: Medicare Other | Attending: Gastroenterology | Admitting: Gastroenterology

## 2020-06-25 ENCOUNTER — Ambulatory Visit: Payer: Medicare Other | Admitting: Certified Registered Nurse Anesthetist

## 2020-06-25 ENCOUNTER — Other Ambulatory Visit: Payer: Self-pay

## 2020-06-25 DIAGNOSIS — Z1211 Encounter for screening for malignant neoplasm of colon: Secondary | ICD-10-CM | POA: Diagnosis not present

## 2020-06-25 DIAGNOSIS — K635 Polyp of colon: Secondary | ICD-10-CM | POA: Diagnosis not present

## 2020-06-25 DIAGNOSIS — Z79899 Other long term (current) drug therapy: Secondary | ICD-10-CM | POA: Insufficient documentation

## 2020-06-25 DIAGNOSIS — F17210 Nicotine dependence, cigarettes, uncomplicated: Secondary | ICD-10-CM | POA: Diagnosis not present

## 2020-06-25 DIAGNOSIS — D124 Benign neoplasm of descending colon: Secondary | ICD-10-CM | POA: Diagnosis not present

## 2020-06-25 DIAGNOSIS — Z791 Long term (current) use of non-steroidal anti-inflammatories (NSAID): Secondary | ICD-10-CM | POA: Insufficient documentation

## 2020-06-25 DIAGNOSIS — Z7982 Long term (current) use of aspirin: Secondary | ICD-10-CM | POA: Insufficient documentation

## 2020-06-25 DIAGNOSIS — K5730 Diverticulosis of large intestine without perforation or abscess without bleeding: Secondary | ICD-10-CM | POA: Diagnosis not present

## 2020-06-25 HISTORY — PX: COLONOSCOPY WITH PROPOFOL: SHX5780

## 2020-06-25 SURGERY — COLONOSCOPY WITH PROPOFOL
Anesthesia: General

## 2020-06-25 MED ORDER — PROPOFOL 10 MG/ML IV BOLUS
INTRAVENOUS | Status: DC | PRN
  Administered 2020-06-25 (×2): 20 mg via INTRAVENOUS
  Administered 2020-06-25: 50 mg via INTRAVENOUS

## 2020-06-25 MED ORDER — LIDOCAINE HCL (CARDIAC) PF 100 MG/5ML IV SOSY
PREFILLED_SYRINGE | INTRAVENOUS | Status: DC | PRN
  Administered 2020-06-25: 100 mg via INTRAVENOUS

## 2020-06-25 MED ORDER — SODIUM CHLORIDE 0.9 % IV SOLN: INTRAVENOUS | Status: DC

## 2020-06-25 MED ORDER — DEXMEDETOMIDINE (PRECEDEX) IN NS 20 MCG/5ML (4 MCG/ML) IV SYRINGE
PREFILLED_SYRINGE | INTRAVENOUS | Status: AC
  Filled 2020-06-25: qty 5

## 2020-06-25 MED ORDER — PROPOFOL 500 MG/50ML IV EMUL
INTRAVENOUS | Status: DC | PRN
  Administered 2020-06-25: 140 ug/kg/min via INTRAVENOUS

## 2020-06-25 MED ORDER — DEXMEDETOMIDINE (PRECEDEX) IN NS 20 MCG/5ML (4 MCG/ML) IV SYRINGE
PREFILLED_SYRINGE | INTRAVENOUS | Status: DC | PRN
  Administered 2020-06-25: 4 ug via INTRAVENOUS

## 2020-06-25 NOTE — H&P (Signed)
Jonathon Bellows, MD [address], [place], [place], [place], [address] [address], [place], [place], [place], [address] Phone: [phone]  Fax: [phone]  Primary Care Physician:  Lesleigh Noe, MD   Pre-Procedure History & Physical: HPI:  Megan [name] is a 65 y.o. female is here for an colonoscopy.   Past Medical History:  Diagnosis Date  . Asthma   . Diabetes mellitus without complication (HCC)    diet controlled  . Hypercholesteremia   . Hypertension     Past Surgical History:  Procedure Laterality Date  . TUBAL LIGATION      Prior to Admission medications   Medication Sig Start Date End Date Taking? Authorizing Provider  aspirin 81 MG chewable tablet Chew by mouth daily.   Yes [provider]  atorvastatin (LIPITOR) 40 MG tablet Take 1 tablet (40 mg total) by mouth daily. 02/27/20  Yes Elby Beck, FNP  B Complex Vitamins (VITAMIN B COMPLEX 100 IJ) Take by mouth.   Yes [provider]  BIOTIN PO Take by mouth.   Yes [provider]  CALCIUM PO Take by mouth.   Yes [provider]  Cholecalciferol 25 MCG (1000 UT) capsule Take by mouth.   Yes [provider]  COLLAGEN PO Take by mouth.   Yes [provider]  cyanocobalamin 1000 MCG tablet Take by mouth.   Yes [provider]  Docusate Sodium (COLACE PO) Take by mouth.   Yes [provider]  lisinopril (ZESTRIL) 20 MG tablet Take 1 tablet by mouth daily. 11/02/19  Yes [provider]  omeprazole (PRILOSEC) 20 MG capsule Take 1 capsule (20 mg total) by mouth daily. 05/17/20  Yes Lesleigh Noe, MD  ACETAMINOPHEN PO Take by mouth.    [provider]  albuterol (PROVENTIL HFA;VENTOLIN HFA) 108 (90 Base) MCG/ACT inhaler Inhale into the lungs every 6 (six) hours as needed for wheezing or shortness of breath.    [provider]  cyclobenzaprine (FLEXERIL) 5 MG tablet  08/03/19   [provider]   meloxicam (MOBIC) 15 MG tablet Take 15 mg by mouth daily. 10/11/19   [provider]  UNKNOWN TO PATIENT HTN med and a couple I cannot think of    [provider]    Allergies as of 06/04/2020 - Review Complete 06/04/2020  Allergen Reaction Noted  . Peanut butter flavor  07/12/2017    Family History  Adopted: Yes  Family history unknown: Yes    Social History   Socioeconomic History  . Marital status: Married    Spouse name: Not on file  . Number of children: Not on file  . Years of education: Not on file  . Highest education level: Not on file  Occupational History  . Not on file  Tobacco Use  . Smoking status: Current Every Day Smoker    Packs/day: 0.25    Years: 50.00    Pack years: 12.50    Types: Cigarettes  . Smokeless tobacco: Never Used  Vaping Use  . Vaping Use: Never used  Substance and Sexual Activity  . Alcohol use: Never    Comment: occaional  . Drug use: Never  . Sexual activity: Not Currently  Other Topics Concern  . Not on file  Social History Narrative   05/17/20   From: [place] originally, moved in 2006   Living: with husband (marital stress) and adult daughter   Work: retired - Wellsite geologist  Family: 2 children - Amie [place] (lives with her) and Gwyndolyn Saxon ([place]) - 2 grandchildren - Red Christians (lives with him) and Vladimir Creeks       Enjoys: playing games on phone      Exercise: not currently   Diet: low appetite, alcohol       Safety   Seat belts: Yes    Guns: No   Safe in relationships: does not feel safe with husband - has asked him to leave   Social Determinants of Radio broadcast assistant Strain: Not on file  Food Insecurity: Not on file  Transportation Needs: Not on file  Physical Activity: Not on file  Stress: Not on file  Social Connections: Not on file  Intimate Partner Violence: Not on file    Review of Systems: See HPI, otherwise negative ROS  Physical Exam: BP 129/87   Pulse (!) 102   Temp (!) 96.9  F (36.1 C) (Temporal)   Resp 18   Ht 5\' 6"  (1.676 m)   Wt 81.2 kg   SpO2 99%   BMI 28.89 kg/m  General:   Alert,  pleasant and cooperative in NAD Head:  Normocephalic and atraumatic. Neck:  Supple; no masses or thyromegaly. Lungs:  Clear throughout to auscultation, normal respiratory effort.    Heart:  +S1, +S2, Regular rate and rhythm, No edema. Abdomen:  Soft, nontender and nondistended. Normal bowel sounds, without guarding, and without rebound.   Neurologic:  Alert and  oriented x4;  grossly normal neurologically.  Impression/Plan: Megan [name] is here for an colonoscopy to be performed for Screening colonoscopy average risk   Risks, benefits, limitations, and alternatives regarding  colonoscopy have been reviewed with the patient.  Questions have been answered.  All parties agreeable.   Jonathon Bellows, MD  06/25/2020, 11:05 AM

## 2020-06-25 NOTE — Anesthesia Procedure Notes (Signed)
Date/Time: 06/25/2020 11:17 AM Performed by: Lily Peer, Lorren Rossetti, CRNA Pre-anesthesia Checklist: Patient identified, Emergency Drugs available, Suction available, Patient being monitored and Timeout performed Oxygen Delivery Method: Nasal cannula Induction Type: IV induction

## 2020-06-25 NOTE — Op Note (Signed)
[facility] Gastroenterology Patient Name: Megan [name] Procedure Date: 06/25/2020 11:08 AM MRN: [id number] Account #: [id number] Date of Birth: [birth year] Admit Type: Outpatient Age: 65 Room: [facility] ENDO ROOM 4 Gender: Female Note Status: Finalized Procedure:             Colonoscopy Indications:           Screening for colorectal malignant neoplasm Providers:             Jonathon Bellows MD, MD Medicines:             Monitored Anesthesia Care Complications:         No immediate complications. Procedure:             Pre-Anesthesia Assessment:                        - Prior to the procedure, a History and Physical was                         performed, and patient medications, allergies and                         sensitivities were reviewed. The patient's tolerance                         of previous anesthesia was reviewed.                        - The risks and benefits of the procedure and the                         sedation options and risks were discussed with the                         patient. All questions were answered and informed                         consent was obtained.                        - ASA Grade Assessment: II - A patient with mild                         systemic disease.                        After obtaining informed consent, the colonoscope was                         passed under direct vision. Throughout the procedure,                         the patient's blood pressure, pulse, and oxygen                         saturations were monitored continuously. The                         Colonoscope was introduced through the anus and  advanced to the the cecum, identified by the                         appendiceal orifice. The colonoscopy was performed                         with ease. The patient tolerated the procedure well.                         The quality of the bowel preparation was good. Findings:      The  perianal and digital rectal examinations were normal.      A 5 mm polyp was found in the descending colon. The polyp was sessile.       The polyp was removed with a cold snare. Resection and retrieval were       complete.      Multiple small-mouthed diverticula were found in the sigmoid colon.      The exam was otherwise without abnormality on direct and retroflexion       views. Impression:            - One 5 mm polyp in the descending colon, removed with                         a cold snare. Resected and retrieved.                        - Diverticulosis in the sigmoid colon.                        - The examination was otherwise normal on direct and                         retroflexion views. Recommendation:        - Discharge patient to home (with escort).                        - Resume previous diet.                        - Continue present medications.                        - Await pathology results.                        - Repeat colonoscopy for surveillance based on                         pathology results. Procedure Code(s):     --- Professional ---                        [id number], Colonoscopy, flexible; with removal of                         tumor(s), polyp(s), or other lesion(s) by snare                         technique Diagnosis Code(s):     --- Professional ---  Z12.11, Encounter for screening for malignant neoplasm                         of colon                        K63.5, Polyp of colon                        K57.30, Diverticulosis of large intestine without                         perforation or abscess without bleeding CPT copyright 2019 American Medical Association. All rights reserved. The codes documented in this report are preliminary and upon coder review may  be revised to meet current compliance requirements. Jonathon Bellows, MD Jonathon Bellows MD, MD 06/25/2020 11:28:12 AM This report has been signed electronically. Number of Addenda:  0 Note Initiated On: 06/25/2020 11:08 AM Scope Withdrawal Time: 0 hours 4 minutes 46 seconds  Total Procedure Duration: 0 hours 10 minutes 15 seconds  Estimated Blood Loss:  Estimated blood loss: none.      [facility]

## 2020-06-25 NOTE — Anesthesia Preprocedure Evaluation (Signed)
Anesthesia Evaluation  Patient identified by MRN, date of birth, ID band Patient awake    Reviewed: Allergy & Precautions, NPO status , Patient's Chart, lab work & pertinent test results  History of Anesthesia Complications Negative for: history of anesthetic complications  Airway Mallampati: II  TM Distance: >3 FB Neck ROM: Full    Dental no notable dental hx. (+) Poor Dentition, Missing   Pulmonary asthma , neg sleep apnea, neg COPD, Current SmokerPatient did not abstain from smoking.,  Chronic bronchitis   Pulmonary exam normal breath sounds clear to auscultation       Cardiovascular Exercise Tolerance: Good METShypertension, (-) CAD and (-) Past MI (-) dysrhythmias  Rhythm:Regular Rate:Normal - Systolic murmurs    Neuro/Psych negative neurological ROS  negative psych ROS   GI/Hepatic GERD  ,(+)     substance abuse  alcohol use, Early imaging signs of cirrhosis   Endo/Other  diabetes  Renal/GU negative Renal ROS     Musculoskeletal   Abdominal   Peds  Hematology   Anesthesia Other Findings Past Medical History: No date: Asthma No date: Diabetes mellitus without complication (HCC)     Comment:  diet controlled No date: Hypercholesteremia No date: Hypertension  Reproductive/Obstetrics                            Anesthesia Physical Anesthesia Plan  ASA: III  Anesthesia Plan: General   Post-op Pain Management:    Induction: Intravenous  PONV Risk Score and Plan: 2 and Ondansetron, Propofol infusion and TIVA  Airway Management Planned: Nasal Cannula  Additional Equipment: None  Intra-op Plan:   Post-operative Plan:   Informed Consent: I have reviewed the patients History and Physical, chart, labs and discussed the procedure including the risks, benefits and alternatives for the proposed anesthesia with the patient or authorized representative who has indicated his/her  understanding and acceptance.     Dental advisory given  Plan Discussed with: CRNA and Surgeon  Anesthesia Plan Comments: (Discussed risks of anesthesia with patient, including possibility of difficulty with spontaneous ventilation under anesthesia necessitating airway intervention, PONV, and rare risks such as cardiac or respiratory or neurological events. Patient understands. Patient counseled on benefits of smoking cessation, and increased perioperative risks associated with continued smoking. )        Anesthesia Quick Evaluation

## 2020-06-25 NOTE — Transfer of Care (Signed)
Immediate Anesthesia Transfer of Care Note  Patient: Megan [name]  Procedure(s) Performed: COLONOSCOPY WITH PROPOFOL (N/A )  Patient Location: Endoscopy Unit  Anesthesia Type:General  Level of Consciousness: awake  Airway & Oxygen Therapy: Patient Spontanous Breathing  Post-op Assessment: Report given to RN and Post -op Vital signs reviewed and stable  Post vital signs: Reviewed and stable  Last Vitals:  Vitals Value Taken Time  BP 105/67   Temp    Pulse 90   Resp 21   SpO2 100     Last Pain:  Vitals:   06/25/20 1050  TempSrc: Temporal         Complications: No complications documented.

## 2020-06-25 NOTE — Anesthesia Postprocedure Evaluation (Signed)
Anesthesia Post Note  Patient: Megan [name]  Procedure(s) Performed: COLONOSCOPY WITH PROPOFOL (N/A )  Patient location during evaluation: Endoscopy Anesthesia Type: General Level of consciousness: awake and alert Pain management: pain level controlled Vital Signs Assessment: post-procedure vital signs reviewed and stable Respiratory status: spontaneous breathing, nonlabored ventilation, respiratory function stable and patient connected to nasal cannula oxygen Cardiovascular status: blood pressure returned to baseline and stable Postop Assessment: no apparent nausea or vomiting Anesthetic complications: no   No complications documented.   Last Vitals:  Vitals:   06/25/20 1140 06/25/20 1150  BP: 137/70 123/79  Pulse: 90 78  Resp: (!) 23 (!) 21  Temp:    SpO2: 100% 100%    Last Pain:  Vitals:   06/25/20 1150  TempSrc:   PainSc: 0-No pain                 Arita Miss

## 2020-06-25 NOTE — Progress Notes (Signed)
No risk

## 2020-06-26 ENCOUNTER — Encounter: Payer: Self-pay | Admitting: Gastroenterology

## 2020-06-26 LAB — SURGICAL PATHOLOGY

## 2020-07-05 ENCOUNTER — Telehealth: Payer: Self-pay

## 2020-07-05 NOTE — Telephone Encounter (Signed)
Pt called triage asking about getting seen for hip pain and get a cortidsone injection. I advised her we did not have a provider available that does those right now. I gave her information on [facility] in [place]. She will probably go there.

## 2020-08-07 ENCOUNTER — Other Ambulatory Visit: Payer: Self-pay

## 2020-08-07 DIAGNOSIS — K219 Gastro-esophageal reflux disease without esophagitis: Secondary | ICD-10-CM

## 2020-08-07 MED ORDER — OMEPRAZOLE 20 MG PO CPDR: 20.0000 mg | DELAYED_RELEASE_CAPSULE | Freq: Every day | ORAL | 1 refills | Status: DC

## 2020-08-08 ENCOUNTER — Telehealth: Payer: Self-pay

## 2020-08-08 NOTE — Telephone Encounter (Signed)
Received a PA from [facility] for the following Rx: Omeprazole 20 mg DR Tablets  PA submitted through cover my meds. Waiting on response.

## 2020-09-20 ENCOUNTER — Ambulatory Visit (INDEPENDENT_AMBULATORY_CARE_PROVIDER_SITE_OTHER): Payer: Medicare Other | Admitting: Family Medicine

## 2020-09-20 ENCOUNTER — Other Ambulatory Visit: Payer: Self-pay

## 2020-09-20 ENCOUNTER — Telehealth: Payer: Self-pay

## 2020-09-20 ENCOUNTER — Telehealth: Payer: Self-pay | Admitting: *Deleted

## 2020-09-20 VITALS — BP 118/64 | HR 114 | Temp 97.7°F | Ht 66.0 in | Wt 174.0 lb

## 2020-09-20 DIAGNOSIS — I10 Essential (primary) hypertension: Secondary | ICD-10-CM | POA: Diagnosis not present

## 2020-09-20 DIAGNOSIS — R160 Hepatomegaly, not elsewhere classified: Secondary | ICD-10-CM

## 2020-09-20 DIAGNOSIS — E782 Mixed hyperlipidemia: Secondary | ICD-10-CM | POA: Diagnosis not present

## 2020-09-20 DIAGNOSIS — F1010 Alcohol abuse, uncomplicated: Secondary | ICD-10-CM

## 2020-09-20 DIAGNOSIS — M1612 Unilateral primary osteoarthritis, left hip: Secondary | ICD-10-CM | POA: Diagnosis not present

## 2020-09-20 DIAGNOSIS — Z748 Other problems related to care provider dependency: Secondary | ICD-10-CM | POA: Insufficient documentation

## 2020-09-20 LAB — LIPID PANEL
Cholesterol: 180 mg/dL (ref 0–200)
HDL: 68.2 mg/dL (ref >39.00)
LDL Cholesterol: 84 mg/dL (ref 0–99)
NonHDL: 111.89
Total CHOL/HDL Ratio: 3
Triglycerides: 138 mg/dL (ref 0.0–149.0)
VLDL: 27.6 mg/dL (ref 0.0–40.0)

## 2020-09-20 LAB — CBC
HCT: 36.3 % (ref 36.0–46.0)
Hemoglobin: 11.7 g/dL — ABNORMAL LOW (ref 12.0–15.0)
MCHC: 32.2 g/dL (ref 30.0–36.0)
MCV: 94.4 fl (ref 78.0–100.0)
Platelets: 297 10*3/uL (ref 150.0–400.0)
RBC: 3.85 Mil/uL — ABNORMAL LOW (ref 3.87–5.11)
RDW: 17.1 % — ABNORMAL HIGH (ref 11.5–15.5)
WBC: 7.3 10*3/uL (ref 4.0–10.5)

## 2020-09-20 LAB — COMPREHENSIVE METABOLIC PANEL
ALT: 40 U/L — ABNORMAL HIGH (ref 0–35)
AST: 73 U/L — ABNORMAL HIGH (ref 0–37)
Albumin: 4.1 g/dL (ref 3.5–5.2)
Alkaline Phosphatase: 330 U/L — ABNORMAL HIGH (ref 39–117)
BUN: 15 mg/dL (ref 6–23)
CO2: 28 mEq/L (ref 19–32)
Calcium: 11.9 mg/dL — ABNORMAL HIGH (ref 8.4–10.5)
Chloride: 99 mEq/L (ref 96–112)
Creatinine, Ser: 0.87 mg/dL (ref 0.40–1.20)
GFR: 69.63 mL/min (ref >60.00)
Glucose, Bld: 310 mg/dL — ABNORMAL HIGH (ref 70–99)
Potassium: 3.9 mEq/L (ref 3.5–5.1)
Sodium: 135 mEq/L (ref 135–145)
Total Bilirubin: 1 mg/dL (ref 0.2–1.2)
Total Protein: 8.2 g/dL (ref 6.0–8.3)

## 2020-09-20 NOTE — Assessment & Plan Note (Signed)
BP at goal. Cont lisinopril 20 mg

## 2020-09-20 NOTE — Telephone Encounter (Signed)
Left VM for pt confirming appt date, time, location and fasting instructions.

## 2020-09-20 NOTE — Telephone Encounter (Signed)
Pt left v/m wanting to confirm pt has appt on 09/24/20 at 9:15 for radiology; pt wants to know location of [place] and is there any preparation prior to appt. Pt had appt earlier today with Dr Einar Pheasant. Sending note to [facility] CMA.

## 2020-09-20 NOTE — Progress Notes (Signed)
Subjective:     Megan [name] is a 65 y.o. female presenting for Follow-up (3 mo)     HPI  #Alcohol use - recently separated from her husband - has not had a drink since 09/15/2020 - no withdrawal symptoms - is hoping to remain sober - up until this week had been drinking more heavily  - husband moved to [place]  - she continues to live with her daughter  #Hip pain - s/p cortisone injection and steroids - following with [facility] - waiting on insurance to get the gel as an attempt - has a cruise this month - not doing physical therapy - would be interested in PT but if able to do home health  Review of Systems  06/18/2020: Clinic - alcohol abuse - decreased to "few shots" from 20 oz daily. Prediabetes. GERd improved with less alcohol  Social History   Tobacco Use  Smoking Status Every Day   Packs/day: 0.25   Years: 50.00   Pack years: 12.50   Types: Cigarettes  Smokeless Tobacco Never        Objective:    BP Readings from Last 3 Encounters:  09/20/20 118/64  06/25/20 136/71  06/18/20 140/72   Wt Readings from Last 3 Encounters:  09/20/20 174 lb (78.9 kg)  06/25/20 179 lb (81.2 kg)  06/18/20 179 lb (81.2 kg)    BP 118/64   Pulse (!) 114   Temp 97.7 F (36.5 C) (Temporal)   Ht 5\' 6"  (1.676 m)   Wt 174 lb (78.9 kg)   SpO2 98%   BMI 28.08 kg/m    Physical Exam Constitutional:      General: She is not in acute distress.    Appearance: She is well-developed. She is not diaphoretic.  HENT:     Right Ear: External ear normal.     Left Ear: External ear normal.     Nose: Nose normal.  Eyes:     Conjunctiva/sclera: Conjunctivae normal.  Cardiovascular:     Rate and Rhythm: Normal rate and regular rhythm.     Heart sounds: No murmur heard. Pulmonary:     Effort: Pulmonary effort is normal. No respiratory distress.     Breath sounds: Normal breath sounds. No wheezing.  Musculoskeletal:     Cervical back: Neck supple.     Comments:  Difficulty walking with gait favoring hip  Skin:    General: Skin is warm and dry.     Capillary Refill: Capillary refill takes less than 2 seconds.  Neurological:     Mental Status: She is alert. Mental status is at baseline.  Psychiatric:        Mood and Affect: Mood normal.        Behavior: Behavior normal.          Assessment & Plan:   Problem List Items Addressed This Visit       Cardiovascular and Mediastinum   Essential hypertension    BP at goal. Cont lisinopril 20 mg       Relevant Orders   CBC     Digestive   Hepatomegaly    Repeat US next week to follow-up lesion. Discussed with poor appetite and weight loss if [place] no change may plan for GI referral.        Relevant Orders   Comprehensive metabolic panel     Musculoskeletal and Integument   Arthritis of left hip    Following [facility]. Has not tried PT  but has had injection. Advised PT and pt requested [place] will see if able to cover - due to issues with transportation and mobility 2/2 to hip.        Relevant Orders   Ambulatory referral to [place] [facility]     Other   Alcohol abuse - Primary    Working towards remission - sober 4 days. Congrats. Continue efforts       Mixed hyperlipidemia    Cont atorvastatin 40 mg. Will recheck       Relevant Orders   Lipid panel   Assistance needed with transportation    Pt has attempted to use medical transport several times and it is not reliable. Will place to SW referral to see if they can provide additional assistance.        Relevant Orders   AMB Referral to [facility]     Return in about 7 months (around 04/23/2021) for annual.  Lesleigh Noe, MD  This visit occurred during the SARS-CoV-2 public health emergency.  Safety protocols were in place, including screening questions prior to the visit, additional usage of staff PPE, and extensive cleaning of exam room while observing appropriate contact time as indicated for  disinfecting solutions.

## 2020-09-20 NOTE — Assessment & Plan Note (Signed)
Following [facility]. Has not tried PT but has had injection. Advised PT and pt requested [place] will see if able to cover - due to issues with transportation and mobility 2/2 to hip.

## 2020-09-20 NOTE — Assessment & Plan Note (Signed)
Pt has attempted to use medical transport several times and it is not reliable. Will place to SW referral to see if they can provide additional assistance.

## 2020-09-20 NOTE — Assessment & Plan Note (Signed)
Cont atorvastatin 40 mg. Will recheck

## 2020-09-20 NOTE — Assessment & Plan Note (Signed)
Working towards remission - sober 4 days. Congrats. Continue efforts

## 2020-09-20 NOTE — Telephone Encounter (Signed)
   Telephone encounter was:  Successful.  09/20/2020 Name: Megan [name] MRN: [id number] DOB: 12-19-1955  Megan [name] is a 65 y.o. year old female who is a primary care patient of Einar Pheasant, Jobe Marker, MD . The community resource team was consulted for assistance with transportation   Care guide performed the following interventions: Patient provided with information about care guide support team and interviewed to confirm resource needs.  Follow Up Plan:  Care guide will follow up with patient by phone over the next hour  [place], Care Management  [phone] [address] , [place] [address] Email : [email or web address]

## 2020-09-20 NOTE — Patient Instructions (Signed)
Blood work today  Will place referral to Education officer, museum to help to medical transport

## 2020-09-20 NOTE — Assessment & Plan Note (Signed)
Repeat US next week to follow-up lesion. Discussed with poor appetite and weight loss if [place] no change may plan for GI referral.

## 2020-09-24 ENCOUNTER — Other Ambulatory Visit: Payer: Self-pay

## 2020-09-24 ENCOUNTER — Ambulatory Visit
Admission: RE | Admit: 2020-09-24 | Discharge: 2020-09-24 | Disposition: A | Discharge status: Home / Self Care | Payer: Medicare Other | Source: Ambulatory Visit | Attending: Family Medicine | Admitting: Family Medicine

## 2020-09-24 ENCOUNTER — Telehealth: Payer: Self-pay | Admitting: Family Medicine

## 2020-09-24 DIAGNOSIS — D135 Benign neoplasm of extrahepatic bile ducts: Secondary | ICD-10-CM

## 2020-09-24 DIAGNOSIS — K769 Liver disease, unspecified: Secondary | ICD-10-CM

## 2020-09-24 NOTE — Telephone Encounter (Signed)
   Megan [name] DOB: [birth year] MRN: [id number]   RIDER WAIVER AND RELEASE OF LIABILITY  For purposes of improving physical access to our facilities, [facility] is pleased to partner with third parties to provide [facility] patients or other authorized individuals the option of convenient, on-demand ground transportation services (the [place]") through use of the technology service that enables users to request on-demand ground transportation from independent third-party providers.  By opting to use and accept these [employer], I, the undersigned, hereby agree on behalf of myself, and on behalf of any minor child using the Government social research officer for whom I am the parent or legal guardian, as follows:  Government social research officer provided to me are provided by independent third-party transportation providers who are not [employer] or employees and who are unaffiliated with [employer]. [facility] is neither a transportation carrier nor a common or public carrier. [facility] has no control over the quality or safety of the transportation that occurs as a result of the [employer]. [facility] cannot guarantee that any third-party transportation provider will complete any arranged transportation service. [facility] makes no representation, warranty, or guarantee regarding the reliability, timeliness, quality, safety, suitability, or availability of any of the Transport Services or that they will be error free. I fully understand that traveling by vehicle involves risks and dangers of serious bodily injury, including permanent disability, paralysis, and death. I agree, on behalf of myself and on behalf of any minor child using the Transport Services for whom I am the parent or legal guardian, that the entire risk arising out of my use of the [employer] remains solely with me, to the maximum extent permitted under applicable law. The [employer] are provided "as  is" and "as available." [facility] disclaims all representations and warranties, express, implied or statutory, not expressly set out in these terms, including the implied warranties of merchantability and fitness for a particular purpose. I hereby waive and release [facility], its agents, employees, officers, directors, representatives, insurers, attorneys, assigns, successors, subsidiaries, and affiliates from any and all past, present, or future claims, demands, liabilities, actions, causes of action, or suits of any kind directly or indirectly arising from acceptance and use of the [employer]. I further waive and release [facility] and its affiliates from all present and future liability and responsibility for any injury or death to persons or damages to property caused by or related to the use of the [employer]. I have read this Waiver and Release of Liability, and I understand the terms used in it and their legal significance. This Waiver is freely and voluntarily given with the understanding that my right (as well as the right of any minor child for whom I am the parent or legal guardian using the [employer]) to legal recourse against [place] in connection with the [employer] is knowingly surrendered in return for use of these services.   I attest that I read the consent document to Thedore [name], gave Ms. Santarelli the opportunity to ask questions and answered the questions asked (if any). I affirm that Megan [name] then provided consent for she's participation in this program.     Megan [name]

## 2020-09-25 ENCOUNTER — Other Ambulatory Visit: Payer: Self-pay | Admitting: Family Medicine

## 2020-09-25 DIAGNOSIS — R160 Hepatomegaly, not elsewhere classified: Secondary | ICD-10-CM

## 2020-09-25 DIAGNOSIS — K769 Liver disease, unspecified: Secondary | ICD-10-CM

## 2020-09-25 DIAGNOSIS — K760 Fatty (change of) liver, not elsewhere classified: Secondary | ICD-10-CM

## 2020-10-24 ENCOUNTER — Ambulatory Visit (INDEPENDENT_AMBULATORY_CARE_PROVIDER_SITE_OTHER): Payer: Medicare Other | Admitting: Family Medicine

## 2020-10-24 ENCOUNTER — Other Ambulatory Visit: Payer: Self-pay

## 2020-10-24 VITALS — BP 128/70 | HR 98 | Temp 98.2°F | Wt 171.5 lb

## 2020-10-24 DIAGNOSIS — M1612 Unilateral primary osteoarthritis, left hip: Secondary | ICD-10-CM | POA: Diagnosis not present

## 2020-10-24 DIAGNOSIS — E119 Type 2 diabetes mellitus without complications: Secondary | ICD-10-CM

## 2020-10-24 LAB — POCT GLYCOSYLATED HEMOGLOBIN (HGB A1C): Hemoglobin A1C: 9.6 % — AB (ref 4.0–5.6)

## 2020-10-24 LAB — HM DIABETES EYE EXAM

## 2020-10-24 MED ORDER — SITAGLIPTIN PHOSPHATE 100 MG PO TABS: 100.0000 mg | ORAL_TABLET | Freq: Every day | ORAL | 1 refills | Status: DC

## 2020-10-24 NOTE — Progress Notes (Signed)
Subjective:     Megan [name] is a 65 y.o. female presenting for Diabetes (Would like to get eye exam from [facility] today.) and Vaginal Itching     Diabetes  Vaginal Itching   #Diabetes Currently taking farxiga  Using medications without difficulties: Yes  Feet problems:No  Blood Sugars averaging: does not check Last HgbA1c:  Lab Results  Component Value Date   HGBA1C 9.6 (A) 10/24/2020   Diet: went on a cruise - did not follow, had been doing good outside of the cruise, does drink juice, drinks boost daily  Diabetes Health Maintenance Due:    Diabetes Health Maintenance Due  Topic Date Due   OPHTHALMOLOGY EXAM  Never done   HEMOGLOBIN A1C  04/26/2021   FOOT EXAM  10/24/2021   Taking farxiga - not sure what dose Was on Metformin - but was stopped and pt not sure why  #Hip pain - arthritis - saw [facility] - wants to have surgery - severe pain  #cirrhosis - GI appt next month - no alcohol since july  Review of Systems   Social History   Tobacco Use  Smoking Status Every Day   Packs/day: 0.25   Years: 50.00   Pack years: 12.50   Types: Cigarettes  Smokeless Tobacco Never        Objective:    BP Readings from Last 3 Encounters:  10/24/20 128/70  09/20/20 118/64  06/25/20 136/71   Wt Readings from Last 3 Encounters:  10/24/20 171 lb 8 oz (77.8 kg)  09/20/20 174 lb (78.9 kg)  06/25/20 179 lb (81.2 kg)    BP 128/70   Pulse 98   Temp 98.2 F (36.8 C) (Temporal)   Wt 171 lb 8 oz (77.8 kg)   SpO2 98%   BMI 27.68 kg/m    Physical Exam Constitutional:      General: She is not in acute distress.    Appearance: She is well-developed. She is not diaphoretic.  HENT:     Right Ear: External ear normal.     Left Ear: External ear normal.  Eyes:     Conjunctiva/sclera: Conjunctivae normal.  Cardiovascular:     Rate and Rhythm: Normal rate and regular rhythm.     Heart sounds: No murmur heard. Pulmonary:     Effort: Pulmonary effort  is normal. No respiratory distress.     Breath sounds: Normal breath sounds. No wheezing.  Musculoskeletal:     Cervical back: Neck supple.     Comments: Slow movement with holding of hip/leg  Skin:    General: Skin is warm and dry.     Capillary Refill: Capillary refill takes less than 2 seconds.  Neurological:     Mental Status: She is alert. Mental status is at baseline.  Psychiatric:        Mood and Affect: Mood normal.        Behavior: Behavior normal.    Diabetic Foot Exam - Simple   Simple Foot Form Diabetic Foot exam was performed with the following findings: Yes 10/24/2020 10:00 AM  Visual Inspection No deformities, no ulcerations, no other skin breakdown bilaterally: Yes Sensation Testing Intact to touch and monofilament testing bilaterally: Yes Pulse Check Posterior Tibialis and Dorsalis pulse intact bilaterally: Yes Comments         Assessment & Plan:   Problem List Items Addressed This Visit       Endocrine   Type 2 diabetes mellitus without complication, without long-term current use of  insulin ([place]) - Primary    Lab Results  Component Value Date   HGBA1C 9.6 (A) 10/24/2020  Poorly controlled likely 2/2 to poor diet adherence. Due to transportation does not want to see nutritionist - advised [email or web address] [name] (she will call with dose). And Januvia (declined trulicity/ozempic). Not a candidate for metformin 2/2 to liver disease. Return 3 months       Relevant Medications   Dapagliflozin Propanediol (FARXIGA PO)   sitaGLIPtin (JANUVIA) 100 MG tablet   Other Relevant Orders   POCT glycosylated hemoglobin (Hb A1C) (Completed)     Musculoskeletal and Integument   Arthritis of left hip    Advised f/u with [facility] to look into cost of surgery with insurance.          Return in about 3 months (around 01/24/2021) for Diabetes with me or Megan [name] .  Megan Noe, MD  This visit occurred during the SARS-CoV-2 public health emergency.   Safety protocols were in place, including screening questions prior to the visit, additional usage of staff PPE, and extensive cleaning of exam room while observing appropriate contact time as indicated for disinfecting solutions.

## 2020-10-24 NOTE — Assessment & Plan Note (Signed)
Advised f/u with [facility] to look into cost of surgery with insurance.

## 2020-10-24 NOTE — Assessment & Plan Note (Signed)
Lab Results  Component Value Date   HGBA1C 9.6 (A) 10/24/2020   Poorly controlled likely 2/2 to poor diet adherence. Due to transportation does not want to see nutritionist - advised [email or web address] Dire (she will call with dose). And Januvia (declined trulicity/ozempic). Not a candidate for metformin 2/2 to liver disease. Return 3 months

## 2020-10-24 NOTE — Patient Instructions (Addendum)
#  Hip Pain - call [facility] and schedule a follow-up for your hip - Orthopedic Doctor for hip pain  #Liver - Ultrasound you had done - you will see GI/hepatology Dr. Allen Norris in September  #Diabetes - read on [email or web address] - look at diet suggestions - call back with dose Farxiga - Start Januvia 100 mg daily - work on low carb diet

## 2020-12-04 ENCOUNTER — Ambulatory Visit: Payer: Medicare Other | Admitting: Gastroenterology

## 2020-12-05 ENCOUNTER — Other Ambulatory Visit: Payer: Self-pay

## 2020-12-05 ENCOUNTER — Encounter: Payer: Self-pay | Admitting: Family Medicine

## 2020-12-05 ENCOUNTER — Ambulatory Visit (INDEPENDENT_AMBULATORY_CARE_PROVIDER_SITE_OTHER): Payer: Medicare Other | Admitting: Family Medicine

## 2020-12-05 VITALS — BP 118/76 | HR 97 | Temp 97.6°F | Ht 66.0 in | Wt 168.0 lb

## 2020-12-05 DIAGNOSIS — Z720 Tobacco use: Secondary | ICD-10-CM

## 2020-12-05 DIAGNOSIS — K760 Fatty (change of) liver, not elsewhere classified: Secondary | ICD-10-CM

## 2020-12-05 DIAGNOSIS — N611 Abscess of the breast and nipple: Secondary | ICD-10-CM | POA: Diagnosis not present

## 2020-12-05 DIAGNOSIS — I10 Essential (primary) hypertension: Secondary | ICD-10-CM

## 2020-12-05 DIAGNOSIS — E119 Type 2 diabetes mellitus without complications: Secondary | ICD-10-CM | POA: Diagnosis not present

## 2020-12-05 LAB — PROTIME-INR
INR: 1.2 ratio — ABNORMAL HIGH (ref 0.8–1.0)
Prothrombin Time: 12.6 s (ref 9.6–13.1)

## 2020-12-05 LAB — CBC WITH DIFFERENTIAL/PLATELET
Basophils Absolute: 0.1 10*3/uL (ref 0.0–0.1)
Basophils Relative: 0.7 % (ref 0.0–3.0)
Eosinophils Absolute: 0.1 10*3/uL (ref 0.0–0.7)
Eosinophils Relative: 1.6 % (ref 0.0–5.0)
HCT: 36.8 % (ref 36.0–46.0)
Hemoglobin: 11.8 g/dL — ABNORMAL LOW (ref 12.0–15.0)
Lymphocytes Relative: 19.7 % (ref 12.0–46.0)
Lymphs Abs: 1.7 10*3/uL (ref 0.7–4.0)
MCHC: 32.1 g/dL (ref 30.0–36.0)
MCV: 92 fl (ref 78.0–100.0)
Monocytes Absolute: 0.6 10*3/uL (ref 0.1–1.0)
Monocytes Relative: 6.7 % (ref 3.0–12.0)
Neutro Abs: 6.3 10*3/uL (ref 1.4–7.7)
Neutrophils Relative %: 71.3 % (ref 43.0–77.0)
Platelets: 266 10*3/uL (ref 150.0–400.0)
RBC: 4 Mil/uL (ref 3.87–5.11)
RDW: 16 % — ABNORMAL HIGH (ref 11.5–15.5)
WBC: 8.8 10*3/uL (ref 4.0–10.5)

## 2020-12-05 LAB — COMPREHENSIVE METABOLIC PANEL
ALT: 29 U/L (ref 0–35)
AST: 51 U/L — ABNORMAL HIGH (ref 0–37)
Albumin: 3.7 g/dL (ref 3.5–5.2)
Alkaline Phosphatase: 432 U/L — ABNORMAL HIGH (ref 39–117)
BUN: 13 mg/dL (ref 6–23)
CO2: 28 mEq/L (ref 19–32)
Calcium: 11.9 mg/dL — ABNORMAL HIGH (ref 8.4–10.5)
Chloride: 98 mEq/L (ref 96–112)
Creatinine, Ser: 0.89 mg/dL (ref 0.40–1.20)
GFR: 67.66 mL/min (ref >60.00)
Glucose, Bld: 355 mg/dL — ABNORMAL HIGH (ref 70–99)
Potassium: 4.2 mEq/L (ref 3.5–5.1)
Sodium: 133 mEq/L — ABNORMAL LOW (ref 135–145)
Total Bilirubin: 1 mg/dL (ref 0.2–1.2)
Total Protein: 8.2 g/dL (ref 6.0–8.3)

## 2020-12-05 MED ORDER — DAPAGLIFLOZIN PROPANEDIOL 10 MG PO TABS: 10.0000 mg | ORAL_TABLET | Freq: Every day | ORAL | 3 refills | Status: DC

## 2020-12-05 MED ORDER — SULFAMETHOXAZOLE-TRIMETHOPRIM 800-160 MG PO TABS: 1.0000 | ORAL_TABLET | Freq: Two times a day (BID) | ORAL | 0 refills | Status: AC

## 2020-12-05 MED ORDER — BLOOD GLUCOSE MONITOR KIT: PACK | 0 refills | Status: DC

## 2020-12-05 NOTE — Progress Notes (Signed)
Subjective:    Megan [name] is a 65 y.o. female who presents to the office today for a preoperative consultation at the request of surgeon Marland Kitchen, MD who plans on performing Lf THA on October 18.   This consultation is requested for the specific conditions prompting preoperative evaluation (i.e. because of potential affect on operative risk): HTN, DM, hepatomegaly. Planned anesthesia is local and spinal. The patient has the following known anesthesia issues:  none .    Patient has a bleeding risk of:  none . Patient does not have objections to receiving blood products if needed.  Respiratory Risk Factors:  Denies: negative Social History   Tobacco Use   Smoking status: Every Day    Packs/day: 0.25    Years: 50.00    Pack years: 12.50    Types: Cigarettes   Smokeless tobacco: Never  Substance Use Topics   Alcohol use: Never    Comment: occaional    If symptoms present: Consider pulmonary function testing or peak flow, CXR, ECG (>40 yo), hemoglobin, glucose (>45 yo)  Cardiac Risk Factors Age >40, other risks If presents - should obtain ECG. Further tests indicated if ECG with abnormalities   METs - Is able to complete exercise of 4 or more METs Yes > 4 METs: climbing 1 flight of stairs, mowing the lawn (walking), gardening, golfing w/o a cart, doubles tennis, swimming, riding a bike, square dancing, jogging   Patient is having a Moderate Risk risk surgery.  High Risk surgery: emergency surgery, anticipated increased blood loss, aortic or peripheral vascular surgery Intermediate Risk: Abdominal/thoracic, head and neck, carotid endarterectomy, orthopedic surgery, prostate surgery Low Risk: Breast surgery, cataract surgery, superficial surgery, endoscopy   The following portions of the patient's history were reviewed and updated as appropriate: allergies, current medications, past family history, past medical history, past social history, past surgical history, and  problem list.  Review of Systems Review of Systems  Constitutional:  Negative for chills and fever.  HENT:  Negative for congestion and sore throat.   Eyes:  Negative for blurred vision and double vision.  Respiratory:  Negative for shortness of breath.   Cardiovascular:  Negative for chest pain.  Gastrointestinal:  Negative for heartburn, nausea and vomiting.  Genitourinary: Negative.   Musculoskeletal:  Positive for joint pain. Negative for myalgias.  Skin:  Negative for rash.       Boil concern  Neurological:  Negative for dizziness and headaches.  Endo/Heme/Allergies:  Does not bruise/bleed easily.  Psychiatric/Behavioral:  Negative for depression. The patient is not nervous/anxious.       Objective:      Physical Exam BP Readings from Last 3 Encounters:  12/05/20 118/76  10/24/20 128/70  09/20/20 118/64   Wt Readings from Last 3 Encounters:  12/05/20 168 lb (76.2 kg)  10/24/20 171 lb 8 oz (77.8 kg)  09/20/20 174 lb (78.9 kg)    Physical Exam Constitutional:      General: She is not in acute distress.    Appearance: She is well-developed. She is not diaphoretic.  HENT:     Right Ear: Tympanic membrane and external ear normal.     Left Ear: Tympanic membrane and external ear normal.     Nose: Nose normal.  Eyes:     Conjunctiva/sclera: Conjunctivae normal.  Cardiovascular:     Rate and Rhythm: Normal rate and regular rhythm.     Heart sounds: No murmur heard. Pulmonary:     Effort: Pulmonary effort is  normal. No respiratory distress.     Breath sounds: Normal breath sounds. No wheezing.  Chest:     Comments: Left lower breast with several skin lesions - one open with surrounding erythema and tenderness to palpation. Mostly superficial in nature.  Musculoskeletal:     Cervical back: Neck supple.  Skin:    General: Skin is warm and dry.     Capillary Refill: Capillary refill takes less than 2 seconds.  Neurological:     Mental Status: She is alert. Mental  status is at baseline.  Psychiatric:        Mood and Affect: Mood normal.        Behavior: Behavior normal.     Predictors of intubation difficulty:  Morbid obesity? no  Anatomically abnormal facies? no  Prominent incisors? no  Receding mandible? no  Short, thick neck? no  Neck range of motion: normal   Dentition:  very poor dentition missing several teeth  Cardiographics ECG: NSR, possible LAE which was present on prior ekg. No ST changes  Imaging Chest x-ray:  not indicated    Lab Review  Lab Results  Component Value Date   HGBA1C 9.6 (A) 10/24/2020           Assessment:    65 y.o. female with planned surgery as above.   Known risk factors for perioperative complications: DM, HTN, hx of alcohol use not current, current tobacco use  Difficulty with intubation is not anticipated.  Cardiac Risk Estimation: per the Revised Cardiac Risk Index (Circ. 100:1043, 1999), the patient's risk factors for cardiac complications include  none , putting her in: RCI RISK CLASS I (0 risk factors, risk of major cardiac compl. appr. 0.5%)  Major Clinical Predictors: Present ? No, if Yes ->obtain cardiology consult MI <6 weeks ago, unstable angina, decompensated CHF, significant arrhythmia causing hemodynamic instability, severe valvular disease  Intermediate Clinical Predictors: Present ? No, if Yes -> cardiology if METS <4 or high risk procedure, OK for surgery if METs>4 AND low or intermediate risk Mild angina pectoris, MI >6 weeks ago, compensated CHF, DM Minor clinical Predictors: Present ? No, if Yes -> cardiology if METS <4 AND high risk procedure, OK for surgery if METs>4 OR low or intermediate risk Advanced age, abnormal ECG, cardiac rhythm other than sinus, local functional capacity, hx of stroke, uncontrolled HTN  Current medications which may produce withdrawal symptoms if withheld perioperatively: none.      Plan:     Problem List Items Addressed This Visit        Cardiovascular and Mediastinum   Essential hypertension    BP controlled. Overall low cardiac risk for surgery      Relevant Orders   EKG 12-Lead   Comprehensive metabolic panel   CBC with Differential     Digestive   Fatty liver disease, nonalcoholic    Labs today. Will assess INR given upcoming surgery. Has hepatology appointment next week. Appreciate hepatology support.       Relevant Orders   Comprehensive metabolic panel   Protime-INR     Endocrine   Type 2 diabetes mellitus without complication, without long-term current use of insulin ([place]) - Primary    Lab Results  Component Value Date   HGBA1C 9.6 (A) 10/24/2020  Poorly controled. Goal <7.5 for surgery. Glucometer ordered and instructed to monitoring. Pharmacy consult for f/u in 2-3 weeks. Cont Farxiga 10 mg and Januvia 100 mg. Cont diabetic diet.        Relevant Medications  dapagliflozin propanediol (FARXIGA) 10 MG TABS tablet   blood glucose meter kit and supplies KIT   Other Relevant Orders   AMB Referral to Person     Other   Left breast abscess    Boil vs abscess. Given location on breast and several lesions in clumped formation will refer to imaging to evaluate for abscess. Start abx while awaiting work-up.       Relevant Medications   sulfamethoxazole-trimethoprim (BACTRIM DS) 800-160 MG tablet   Other Relevant Orders   US BREAST LTD UNI LEFT INC AXILLA   US BREAST LTD UNI RIGHT INC AXILLA   MM DIAG BREAST TOMO BILATERAL   Tobacco use    Not ready to quit. Encouraged cessation        1. Preoperative workup as follows ECG, hemoglobin, hematocrit, electrolytes, creatinine, glucose, liver function studies, coagulation studies. 2. Change in medication regimen before surgery:  Ok to hold ASA per Psychologist, sport and exercise. OK to hold medications day of surgery if needed.  . 3. Prophylaxis for cardiac events with perioperative beta-blockers: should be considered, specific regimen per anesthesia. 4.  Invasive hemodynamic monitoring perioperatively: at the discretion of anesthesiologist. 5. Deep vein thrombosis prophylaxis postoperatively:regimen to be chosen by surgical team. 6. Surveillance for postoperative MI with ECG immediately postoperatively and on postoperative days 1 and 2 AND troponin levels 24 hours postoperatively and on day 4 or hospital discharge (whichever comes first): not indicated.  Encourage smoking cessation.    I spent >40 minutes with pt , obtaining history, examining, reviewing chart, documenting encounter and discussing the above plan of care.     Lesleigh Noe, MD

## 2020-12-05 NOTE — Assessment & Plan Note (Signed)
Not ready to quit. Encouraged cessation

## 2020-12-05 NOTE — Assessment & Plan Note (Signed)
Labs today. Will assess INR given upcoming surgery. Has hepatology appointment next week. Appreciate hepatology support.

## 2020-12-05 NOTE — Patient Instructions (Addendum)
It is too early to do a Hemoglobin A1c  I want you to start monitoring your sugars at home - check fasting daily Goal <140 - Check 2 hours after you eat 3 times a week. Goal <180  I am going to refer you to our pharmacist and would like you meet with her within the next 2-4 weeks to update sugars  This may be the main reason you cannot get surgery so controlling your sugar is key   Breast Infection  - call this number to schedule your appointment ASAP - Start antibiotics  You can call for a mammogram at these locations:  [facility].  [place]

## 2020-12-05 NOTE — Assessment & Plan Note (Signed)
Boil vs abscess. Given location on breast and several lesions in clumped formation will refer to imaging to evaluate for abscess. Start abx while awaiting work-up.

## 2020-12-05 NOTE — Assessment & Plan Note (Signed)
Lab Results  Component Value Date   HGBA1C 9.6 (A) 10/24/2020  Poorly controled. Goal <7.5 for surgery. Glucometer ordered and instructed to monitoring. Pharmacy consult for f/u in 2-3 weeks. Cont Farxiga 10 mg and Januvia 100 mg. Cont diabetic diet.

## 2020-12-05 NOTE — Assessment & Plan Note (Signed)
BP controlled. Overall low cardiac risk for surgery

## 2020-12-10 ENCOUNTER — Inpatient Hospital Stay
Admission: RE | Admit: 2020-12-10 | Discharge: 2020-12-10 | Disposition: A | Discharge status: Home / Self Care | Payer: Self-pay | Source: Ambulatory Visit | Attending: *Deleted | Admitting: *Deleted

## 2020-12-10 ENCOUNTER — Other Ambulatory Visit: Payer: Self-pay | Admitting: *Deleted

## 2020-12-10 DIAGNOSIS — Z1231 Encounter for screening mammogram for malignant neoplasm of breast: Secondary | ICD-10-CM

## 2020-12-11 ENCOUNTER — Telehealth: Payer: Self-pay | Admitting: Family Medicine

## 2020-12-11 ENCOUNTER — Other Ambulatory Visit: Payer: Medicare Other

## 2020-12-11 ENCOUNTER — Inpatient Hospital Stay: Admission: RE | Admit: 2020-12-11 | Payer: Medicare Other | Source: Ambulatory Visit

## 2020-12-11 DIAGNOSIS — E119 Type 2 diabetes mellitus without complications: Secondary | ICD-10-CM

## 2020-12-11 NOTE — Telephone Encounter (Signed)
Referral placed.

## 2020-12-11 NOTE — Addendum Note (Signed)
Addended by: Lesleigh Noe on: 12/11/2020 04:57 PM   Modules accepted: Orders

## 2020-12-11 NOTE — Telephone Encounter (Signed)
pt would like to be referred to a nutritionist for her diabetes, pt would like this to be in [place]

## 2020-12-12 ENCOUNTER — Ambulatory Visit: Payer: Medicare Other | Admitting: Gastroenterology

## 2020-12-14 ENCOUNTER — Telehealth: Payer: Self-pay | Admitting: Family Medicine

## 2020-12-14 NOTE — Chronic Care Management (AMB) (Signed)
  Chronic Care Management   Note  12/14/2020 Name: Megan [name] MRN: [id number] DOB: 05-28-1955  Megan [name] is a 65 y.o. year old female who is a primary care patient of Einar Pheasant, Jobe Marker, MD. I reached out to Thedore Mins by phone today in response to a referral sent by [name]'s PCP, Lesleigh Noe, MD.   [name] was given information about Chronic Care Management services today including:  CCM service includes personalized support from designated clinical staff supervised by her physician, including individualized plan of care and coordination with other care providers 24/7 contact phone numbers for assistance for urgent and routine care needs. Service will only be billed when office clinical staff spend 20 minutes or more in a month to coordinate care. Only one practitioner may furnish and bill the service in a calendar month. The patient may stop CCM services at any time (effective at the end of the month) by phone call to the office staff.   Patient agreed to services and verbal consent obtained.   Follow up plan:   Tatjana Secretary/administrator

## 2020-12-19 ENCOUNTER — Other Ambulatory Visit: Payer: Self-pay | Admitting: Orthopedic Surgery

## 2020-12-19 ENCOUNTER — Other Ambulatory Visit: Payer: Self-pay | Admitting: Family Medicine

## 2020-12-19 ENCOUNTER — Telehealth: Payer: Medicare Other | Admitting: Family Medicine

## 2020-12-19 ENCOUNTER — Encounter: Payer: Self-pay | Admitting: Orthopedic Surgery

## 2020-12-19 DIAGNOSIS — E1169 Type 2 diabetes mellitus with other specified complication: Secondary | ICD-10-CM

## 2020-12-19 DIAGNOSIS — M1612 Unilateral primary osteoarthritis, left hip: Secondary | ICD-10-CM

## 2020-12-19 NOTE — H&P (Signed)
  NAME: Megan [name] MRN:   [id number] DOB:   Nov 08, 1955     HISTORY AND PHYSICAL  CHIEF COMPLAINT:  left hip pain  HISTORY:   Megan [name] a 65 y.o. female  with left  Hip Pain Patient complains of left hip pain. Onset of the symptoms was several years ago. Inciting event: none. The patient reports the hip pain is worse with weight bearing. Associated symptoms: none. Aggravating symptoms include: any weight bearing. Patient has had prior hip problems. Previous visits for this problem: none. Evaluation to date: plain films, which were abnormal  osteoarht . Treatment to date: OTC analgesics, which have been somewhat effective, prescription analgesics, which have been somewhat effective, home exercise program, which has been somewhat effective, and physical therapy, which has been somewhat effective.   Plan for left total hip replacement  PAST MEDICAL HISTORY:   Past Medical History:  Diagnosis Date   Asthma    Diabetes mellitus without complication ([place])    diet controlled   Hypercholesteremia    Hypertension     PAST SURGICAL HISTORY:   Past Surgical History:  Procedure Laterality Date   COLONOSCOPY WITH PROPOFOL N/A 06/25/2020   Procedure: COLONOSCOPY WITH PROPOFOL;  Surgeon: Jonathon Bellows, MD;  Location: [facility] ENDOSCOPY;  Service: Endoscopy;  Laterality: N/A;   TUBAL LIGATION      MEDICATIONS:  (Not in a hospital admission)   ALLERGIES:   Allergies  Allergen Reactions   Peanut Butter Flavor Anaphylaxis   Peanut-Containing Drug Products Anaphylaxis    REVIEW OF SYSTEMS:   Negative except HPI  FAMILY HISTORY:   Family History  Adopted: Yes  Family history unknown: Yes    SOCIAL HISTORY:   reports that she has been smoking cigarettes. She has a 12.50 pack-year smoking history. She has never used smokeless tobacco. She reports that she does not drink alcohol and does not use drugs.  PHYSICAL EXAM:  General appearance: alert, cooperative, and no distress Resp: clear  to auscultation bilaterally Cardio: regular rate and rhythm, S1, S2 normal, no murmur, click, rub or gallop Pelvic: deferred Extremities: extremities normal, atraumatic, no cyanosis or edema and Homans sign is negative, no sign of DVT Pulses: 2+ and symmetric Skin: Skin color, texture, turgor normal. No rashes or lesions    LABORATORY STUDIES: No results for input(s): WBC, HGB, HCT, PLT in the last 72 hours.  No results for input(s): NA, K, CL, CO2, GLUCOSE, BUN, CREATININE, CALCIUM in the last 72 hours.  STUDIES/RESULTS:  MM Outside Films Mammo  Result Date: 12/10/2020 This examination belongs to an outside facility and is stored here for comparison purposes only.  Contact the originating outside institution for any associated report or interpretation.   ASSESSMENT:  End stage osteoarthritis left hip        Active Problems:   * No active hospital problems. *    PLAN:  Left Primary Total Hip   Carlynn Spry 12/19/2020. 10:49 AM

## 2020-12-19 NOTE — Progress Notes (Signed)
Placing referral for eye exam

## 2020-12-20 ENCOUNTER — Encounter: Payer: Self-pay | Admitting: Certified Registered"

## 2020-12-20 ENCOUNTER — Encounter: Payer: Self-pay | Admitting: Urgent Care

## 2020-12-20 ENCOUNTER — Other Ambulatory Visit: Payer: Self-pay

## 2020-12-20 ENCOUNTER — Encounter
Admission: RE | Admit: 2020-12-20 | Discharge: 2020-12-20 | Disposition: A | Discharge status: Home / Self Care | Payer: Medicare Other | Source: Ambulatory Visit | Attending: Orthopedic Surgery | Admitting: Orthopedic Surgery

## 2020-12-20 DIAGNOSIS — M1612 Unilateral primary osteoarthritis, left hip: Secondary | ICD-10-CM | POA: Insufficient documentation

## 2020-12-20 DIAGNOSIS — Z01812 Encounter for preprocedural laboratory examination: Secondary | ICD-10-CM | POA: Diagnosis not present

## 2020-12-20 HISTORY — DX: Dyspnea, unspecified: R06.00

## 2020-12-20 HISTORY — DX: Gastro-esophageal reflux disease without esophagitis: K21.9

## 2020-12-20 HISTORY — DX: Inflammatory liver disease, unspecified: K75.9

## 2020-12-20 HISTORY — DX: Unspecified osteoarthritis, unspecified site: M19.90

## 2020-12-20 LAB — URINALYSIS, ROUTINE W REFLEX MICROSCOPIC
Bacteria, UA: NONE SEEN
Bilirubin Urine: NEGATIVE
Glucose, UA: >=500 mg/dL — AB
Hgb urine dipstick: NEGATIVE
Ketones, ur: NEGATIVE mg/dL
Leukocytes,Ua: NEGATIVE
Nitrite: NEGATIVE
Protein, ur: NEGATIVE mg/dL
Specific Gravity, Urine: 1.021 (ref 1.005–1.030)
pH: 5 (ref 5.0–8.0)

## 2020-12-20 LAB — SURGICAL PCR SCREEN
MRSA, PCR: NEGATIVE
Staphylococcus aureus: NEGATIVE

## 2020-12-20 LAB — PROTIME-INR
INR: 1.1 (ref 0.8–1.2)
Prothrombin Time: 14.3 seconds (ref 11.4–15.2)

## 2020-12-20 LAB — APTT: aPTT: 39 seconds — ABNORMAL HIGH (ref 24–36)

## 2020-12-20 NOTE — Patient Instructions (Addendum)
Your procedure is scheduled on: Monday January 07, 2021. Report to Day Surgery inside [facility] 2nd floor. To find out your arrival time please call [phone] between 1PM - 3PM on Friday January 04, 2021.  Remember: Instructions that are not followed completely may result in serious medical risk,  up to and including death, or upon the discretion of your surgeon and anesthesiologist your  surgery may need to be rescheduled.     _X__ 1. Do not eat food or drink fluids after midnight the night before your procedure.                 No chewing gum or hard candies.   __X__2.  On the morning of surgery brush your teeth with toothpaste and water, you                may rinse your mouth with mouthwash if you wish.  Do not swallow any toothpaste of mouthwash.     _X__ 3.  No Alcohol for 24 hours before or after surgery.   _X__ 4.  Do Not Smoke or use e-cigarettes For 24 Hours Prior to Your Surgery.                 Do not use any chewable tobacco products for at least 6 hours prior to                 Surgery.  _X__  5.  Do not use any recreational drugs (marijuana, cocaine, heroin, ecstasy, MDMA or other)                For at least one week prior to your surgery.  Combination of these drugs with anesthesia                May have life threatening results.  ____  6.  Bring all medications with you on the day of surgery if instructed.   ____  7.  Notify your doctor if there is any change in your medical condition      (cold, fever, infections).     Do not wear jewelry, make-up, hairpins, clips or nail polish. Do not wear lotions, powders, or perfumes. You may wear deodorant. Do not shave 48 hours prior to surgery. Men may shave face and neck. Do not bring valuables to the hospital.    [facility] is not responsible for any belongings or valuables.  Contacts, dentures or bridgework may not be worn into surgery. Leave your suitcase in the car. After surgery it  may be brought to your room. For patients admitted to the hospital, discharge time is determined by your treatment team.   Patients discharged the day of surgery will not be allowed to drive home.   Make arrangements for someone to be with you for the first 24 hours of your Same Day Discharge.    Please read over the following fact sheets that you were given:   Total Joint Packet    __X__ Take these medicines the morning of surgery with A SIP OF WATER:    1. omeprazole (PRILOSEC) 20 MG  2. atorvastatin (LIPITOR) 40 MG   3.   4.  5.  6.  ____ Fleet Enema (as directed)   __X__ Use CHG Soap (or wipes) as directed  ____ Use Benzoyl Peroxide Gel as instructed  __X__ Use inhalers on the day of surgery  albuterol (PROVENTIL HFA;VENTOLIN HFA) 108 (90 Base) MCG/ACT inhaler  __X__ Stop  dapagliflozin  propanediol (FARXIGA) 3 days prior to surgery (last dose will be 01/03/21)    ____ Take 1/2 of usual insulin dose the night before surgery. No insulin the morning          of surgery.   __X __ Stop aspirin EC 81 MG 7 days prior to surgery as instructed by surgeon.    __X__ One Week prior to surgery- Stop Anti-inflammatories such as Ibuprofen, Aleve, Advil, Motrin, meloxicam (MOBIC), diclofenac, etodolac, ketorolac, Toradol, Daypro, piroxicam, Goody's or BC powders. OK TO USE TYLENOL IF NEEDED   __X__ Stop ALL supplements until after surgery. Omega-3 Fatty Acids (FISH OIL PO) BLACK CURRANT SEED OIL PO, Biotin w/ Vitamins C & E, Biotin 5000 MCG   ____ Bring C-Pap to the hospital.   Transportation scheduled for 01/07/21 with [facility] [phone], will receive phone call from them Friday 01/04/21 to confirm appointment.   If you have any questions regarding your pre-procedure instructions,  Please call Pre-admit Testing at [phone]

## 2020-12-24 ENCOUNTER — Telehealth: Payer: Self-pay

## 2020-12-24 NOTE — Progress Notes (Addendum)
Chronic Care Management Pharmacy Assistant   Name: Megan [name]  MRN: [id number] DOB: [birth year]  Reason for Encounter: Initial Questions   Recent office visits:  12/05/2020 - Megan Schooner, MD - Patient presented for pre-op clearance and diabetes. Labs: CBC, CMP, Protime-INR, EKG. Start: sulfamethoxazole-trimethoprim (BACTRIM DS) 800-160 MG tablet 10/24/2020 - Megan Schooner, MD - Patient presented for diabetes and vaginal itching. Labs: A1c. Start: sitaGLIPtin (JANUVIA) 100 MG tablet.  09/20/2020 - Megan Schooner, MD - Patient presented for follow up for alcohol abuse and hyperlipidemia. Patient sober 4 days. Labs: CBC, CMP, Lipid. Referral to [facility] Coordination and to Home Health.  No medication changes.   Recent consult visits:  12/19/2020 - Orthopedics - Patient presented for left hip pain. Plan is for left total hip replacement. Procedure is scheduled on: Monday. January 07, 2021.  06/25/2020 - Megan Bellows, MD - Patient presented for Colonoscopy with Propofol.   Hospital visits:  None in previous 6 months  Medications: Outpatient Encounter Medications as of 12/24/2020  Medication Sig   albuterol (PROVENTIL HFA;VENTOLIN HFA) 108 (90 Base) MCG/ACT inhaler Inhale 1-2 puffs into the lungs every 6 (six) hours as needed for wheezing or shortness of breath.   aspirin EC 81 MG tablet Take 81 mg by mouth in the morning. Swallow whole.   atorvastatin (LIPITOR) 40 MG tablet Take 1 tablet (40 mg total) by mouth daily.   Biotin 5000 MCG TABS Take 5,000 mcg by mouth in the morning.   Biotin w/ Vitamins C & E (HAIR/SKIN/NAILS PO) Take 1 capsule by mouth in the morning.   BLACK CURRANT SEED OIL PO Take 1 capsule by mouth in the morning.   blood glucose meter kit and supplies KIT Dispense based on patient and insurance preference. Use up to four times daily as directed.   Calcium Carbonate-Vitamin D (CVS CALCIUM 600 + D PO) Take 1 tablet by mouth in the morning. Takes 3 times per week    dapagliflozin propanediol (FARXIGA) 10 MG TABS tablet Take 1 tablet (10 mg total) by mouth daily.   diphenhydrAMINE (BENADRYL) 25 MG tablet Take 25 mg by mouth daily.   docusate sodium (COLACE) 50 MG capsule Take 50 mg by mouth in the morning.   ibuprofen (ADVIL) 200 MG tablet Take 600 mg by mouth in the morning and at bedtime.   lisinopril (ZESTRIL) 20 MG tablet Take 20 mg by mouth daily.   Omega-3 Fatty Acids (FISH OIL PO) Take 2,400 mg by mouth in the morning.   omeprazole (PRILOSEC) 20 MG capsule Take 1 capsule (20 mg total) by mouth daily.   sitaGLIPtin (JANUVIA) 100 MG tablet Take 1 tablet (100 mg total) by mouth daily.   No facility-administered encounter medications on file as of 12/24/2020.    Lab Results  Component Value Date/Time   HGBA1C 9.6 (A) 10/24/2020 09:20 AM   HGBA1C 5.8 (A) 06/18/2020 10:13 AM   HGBA1C 5.8 12/12/2019 12:00 AM   MICROALBUR Negative 12/12/2019 12:00 AM     BP Readings from Last 3 Encounters:  12/20/20 120/80  12/05/20 118/76  10/24/20 128/70    Patient contacted to review initial questions prior to visit with Megan [name].  Have you seen any other providers since your last visit with PCP? Yes  Any changes in your medications or health? No  Any side effects from any medications? No  Do you have an symptoms or problems not managed by your medications? No  Any concerns about your health right now? Left  arm is painful; hip replacement might help as she is off balance.  Has your provider asked that you check blood pressure, blood sugar, or follow special diet at home? Yes  Patient checks her blood sugar daily; fasting and before medication.  12/24/20 - 151  12/23/20 - 171  12/22/20 - 130  12/21/20 - 158  Do you get any type of exercise on a regular basis? Yes Running in and out with a puppy that is 34 weeks old. The puppy is a lab/boxer.   Can you think of a goal you would like to reach for your health? Yes Patient states she would like to  stop smoking. She recently stopped drinking; last drink was July 2. She does not do meetings; no withdrawals. Patient states her weight is a concern. She states she is losing weight too fast; she can see it. Patient does not have a scale; but doctor office she was 164. She has lost 15 lbs in 3 months.   Do you have any problems getting your medications? No  Is there anything that you would like to discuss during the appointment? No   Megan [name] was reminded to have all medications, supplements and any blood glucose and blood pressure readings available for review with Megan [name], Pharm. D, at her telephone visit on 12/26/2018 at 1:00 pm.   Star Rating Drugs:  Medication:  Last Fill: Day Supply Farxiga 46m   11/27/2020 30 Januvia 1033m 10/24/2020 90  Atorvastatin 4064m9/13/2022 30  Lisinopril 5m47m9/02/2021 30   Care Gaps: Annual wellness visit in last year? No Most Recent BP reading: 120/80 on 12/20/2020  If Diabetic: Most recent A1C reading: 9.6 on 10/24/2020 Last eye exam / retinopathy screening: Up to date Last diabetic foot exam: Up to date  Megan DusP notified  Megan [name] Megan NiemannA [place]re[place]istant [phone] reviewed the care management and care coordination activities outlined in this encounter and I am certifying that I agree with the content of this note. No further action required.  Megan DusarmD Clinical Pharmacist [phone]

## 2020-12-25 ENCOUNTER — Other Ambulatory Visit: Payer: Self-pay

## 2020-12-25 ENCOUNTER — Ambulatory Visit (INDEPENDENT_AMBULATORY_CARE_PROVIDER_SITE_OTHER): Payer: Medicare Other

## 2020-12-25 DIAGNOSIS — I10 Essential (primary) hypertension: Secondary | ICD-10-CM

## 2020-12-25 DIAGNOSIS — E1169 Type 2 diabetes mellitus with other specified complication: Secondary | ICD-10-CM

## 2020-12-25 NOTE — Patient Instructions (Signed)
December 25, 2020  Dear Thedore Mins,  It was a pleasure meeting you during our initial appointment on December 25, 2020. Below is a summary of the goals we discussed and components of chronic care management. Please contact me anytime with questions or concerns.   Visit Information   Patient Care Plan: CCM Pharmacy Care Plan     Problem Identified: Disease Management   Onset Date: 12/25/2020  Note:    Current Barriers:  Suboptimal control - diabetes  Pharmacist Clinical Goal(s):  Patient will achieve improvement in diabetes control as evidenced by A1c through collaboration with PharmD and provider.   Interventions: 1:1 collaboration with Lesleigh Noe, MD regarding development and update of comprehensive plan of care as evidenced by provider attestation and co-signature Inter-disciplinary care team collaboration (see longitudinal plan of care) Comprehensive medication review performed; medication list updated in electronic medical record  Hypertension (BP goal <140/90) -Controlled - per clinic readings -Current treatment: Lisinopril 20 mg - 1 tablet daily  -Medications previously tried: none reported  -Current home readings: none reported -Denies hypotensive/hypertensive symptoms -Reviewed adherence, no gaps in fill history -Recommended to continue current medication  Diabetes (A1c goal <7%) -Uncontrolled - A1c 9.6% (10/24/20). Pt reports sugars have improved with better diet (avoiding starches, eating more vegetables). In July she went on cruise and ate very poorly resulting in high A1c. Prior to this A1c was 5.8% on just [place]. She is also scheduled with nutritionist next week. -Current medications: Januvia 100 mg - 1 tablet daily (started August 2022) Farxiga 10 mg - 1 tablet daily -Medications previously tried: none   -Current home glucose readings fasting glucose:  12/24/20 - 161 12/23/20 - 158 12/22/20 - 171 12/20/20 - 206 12/19/20 - 219  (cheeseburger/mcdonalds the night before) 12/18/20 - 175 12/17/20 - 174 12/16/20 - 177 12/15/20 - 131 12/14/20 - 137 post prandial glucose: n/a -Denies hypoglycemic/hyperglycemic symptoms (prior had vaginal burning but this has improved) -Current meal patterns: she tries to use sugar free substitutes, more veggies and less starches -Current exercise: walking her puppy  -Educated on A1c and blood sugar goals; -Counseled to check feet daily and get yearly eye exams - she is due for annual eye exam  -She would prefer to avoid injections at this time. I would not consider metformin off the table. It can be beneficial in fatty liver. -Recommended to continue current medication; Pt will continue to work on diet/meet with nutritionist. Follow up with CCM in 4 weeks (01/22/21)   Patient Goals/Self-Care Activities Patient will:  - take medications as prescribed check glucose twice daily, document, and provide at future appointments  Follow Up Plan: Telephone follow up appointment with care management team member scheduled for:  4 weeks       Ms. Langston was given information about Chronic Care Management services today including:  CCM service includes personalized support from designated clinical staff supervised by her physician, including individualized plan of care and coordination with other care providers 24/7 contact phone numbers for assistance for urgent and routine care needs. Standard insurance, coinsurance, copays and deductibles apply for chronic care management only during months in which we provide at least 20 minutes of these services. Most insurances cover these services at 100%, however patients may be responsible for any copay, coinsurance and/or deductible if applicable. This service may help you avoid the need for more expensive face-to-face services. Only one practitioner may furnish and bill the service in a calendar month. The patient may stop CCM services at  any time  (effective at the end of the month) by phone call to the office staff.  Patient agreed to services and verbal consent obtained.   Patient verbalizes understanding of instructions provided today and agrees to view in [place].   Debbora Dus, PharmD Clinical Pharmacist [facility] at [facility] [phone]

## 2020-12-25 NOTE — Progress Notes (Signed)
Chronic Care Management Pharmacy Note  12/25/2020 Name:  Isla Sabree MRN:  [id number] DOB:  Jan 15, 1956  Summary: Consulted by PCP for diabetes visit. BG are improving some (fasting BG 130-170) on Januvia and Farxiga with dietary changes. She has a visit scheduled with nutritionist next week. She would like to bring her BG down further with diet before discussing additional medication. We will follow up in 4 weeks to review logs.  Recommendations/Changes made from today's visit: None at this time  Plan: CCM follow up 4 weeks   Subjective: Mischell Branford is an 65 y.o. year old female who is a primary patient of Cody, Jobe Marker, MD.  The CCM team was consulted for assistance with disease management and care coordination needs.    Engaged with patient by telephone for initial visit in response to provider referral for pharmacy case management and/or care coordination services.   Consent to Services:  The patient was given the following information about Chronic Care Management services today, agreed to services, and gave verbal consent: 1. CCM service includes personalized support from designated clinical staff supervised by the primary care provider, including individualized plan of care and coordination with other care providers 2. 24/7 contact phone numbers for assistance for urgent and routine care needs. 3. Service will only be billed when office clinical staff spend 20 minutes or more in a month to coordinate care. 4. Only one practitioner may furnish and bill the service in a calendar month. 5.The patient may stop CCM services at any time (effective at the end of the month) by phone call to the office staff. 6. The patient will be responsible for cost sharing (co-pay) of up to 20% of the service fee (after annual deductible is met). Patient agreed to services and consent obtained.  Patient Care Team: Lesleigh Noe, MD as PCP - General (Family Medicine) Debbora Dus, [facility] as  Pharmacist (Pharmacist)  Recent office visits:  12/05/2020 - Waunita Schooner, MD - Patient presented for pre-op clearance and diabetes. Labs: CBC, CMP, Protime-INR, EKG. Start: sulfamethoxazole-trimethoprim (BACTRIM DS) 800-160 MG tablet 10/24/2020 - Waunita Schooner, MD - Patient presented for diabetes and vaginal itching. Labs: A1c. Start: sitaGLIPtin (JANUVIA) 100 MG tablet.  09/20/2020 - Waunita Schooner, MD - Patient presented for follow up for alcohol abuse and hyperlipidemia. Patient sober 4 days. Labs: CBC, CMP, Lipid. Referral to [facility] Coordination and to Home Health.  No medication changes.    Recent consult visits:  12/19/2020 - Orthopedics - Patient presented for left hip pain. Plan is for left total hip replacement. Procedure is scheduled on: Monday. January 07, 2021.  06/25/2020 - Jonathon Bellows, MD - Patient presented for Colonoscopy with Propofol.    Hospital visits:  None in previous 6 months   Objective:  Lab Results  Component Value Date   CREATININE 0.89 12/05/2020   BUN 13 12/05/2020   GFR 67.66 12/05/2020   NA 133 (L) 12/05/2020   K 4.2 12/05/2020   CALCIUM 11.9 (H) 12/05/2020   CO2 28 12/05/2020   GLUCOSE 355 (H) 12/05/2020    Lab Results  Component Value Date/Time   HGBA1C 9.6 (A) 10/24/2020 09:20 AM   HGBA1C 5.8 (A) 06/18/2020 10:13 AM   HGBA1C 5.8 12/12/2019 12:00 AM   GFR 67.66 12/05/2020 09:11 AM   GFR 69.63 09/20/2020 10:45 AM   MICROALBUR Negative 12/12/2019 12:00 AM    Last diabetic Eye exam: Referral place 12/19/20 by PCP  Last diabetic Foot exam: 10/24/20 - completed by PCP  Lab Results  Component Value Date   CHOL 180 09/20/2020   HDL 68.20 09/20/2020   LDLCALC 84 09/20/2020   TRIG 138.0 09/20/2020   CHOLHDL 3 09/20/2020    Hepatic Function Latest Ref Rng & Units 12/05/2020 09/20/2020 02/15/2020  Total Protein 6.0 - 8.3 g/dL 8.2 8.2 8.6(H)  Albumin 3.5 - 5.2 g/dL 3.7 4.1 4.2  AST 0 - 37 U/L 51(H) 73(H) 104(H)  ALT 0 - 35 U/L 29 40(H) 40(H)   Alk Phosphatase 39 - 117 U/L 432(H) 330(H) 367(H)  Total Bilirubin 0.2 - 1.2 mg/dL 1.0 1.0 0.7    No results found for: TSH, FREET4  CBC Latest Ref Rng & Units 12/05/2020 09/20/2020 02/15/2020  WBC 4.0 - 10.5 K/uL 8.8 7.3 11.7(H)  Hemoglobin 12.0 - 15.0 g/dL 11.8(L) 11.7(L) 12.8  Hematocrit 36.0 - 46.0 % 36.8 36.3 38.4  Platelets 150.0 - 400.0 K/uL 266.0 297.0 272.0    No results found for: VD25OH  Clinical ASCVD: No  The 10-year ASCVD risk score (Arnett DK, et al., 2019) is: 30.6%   Values used to calculate the score:     Age: 65 years     Sex: Female     Is Non-Hispanic African American: Yes     Diabetic: Yes     Tobacco smoker: Yes     Systolic Blood Pressure: 741 mmHg     Is BP treated: Yes     HDL Cholesterol: 68.2 mg/dL     Total Cholesterol: 180 mg/dL    Depression screen [facility] 2/9 02/20/2020 02/15/2020 02/15/2020  Decreased Interest '3 3 3  ' Down, Depressed, Hopeless 3 3 -  PHQ - 2 Score '6 6 3  ' Altered sleeping 3 3 -  Tired, decreased energy '2 2 2  ' Change in appetite '2 2 2  ' Feeling bad or failure about yourself  3 3 -  Trouble concentrating 0 0 -  Moving slowly or fidgety/restless 0 0 -  Suicidal thoughts 0 0 -  PHQ-9 Score 16 16 -    Social History   Tobacco Use  Smoking Status Every Day   Packs/day: 0.25   Years: 50.00   Pack years: 12.50   Types: Cigarettes  Smokeless Tobacco Never   BP Readings from Last 3 Encounters:  12/20/20 120/80  12/05/20 118/76  10/24/20 128/70   Pulse Readings from Last 3 Encounters:  12/20/20 86  12/05/20 97  10/24/20 98   Wt Readings from Last 3 Encounters:  12/20/20 166 lb (75.3 kg)  12/05/20 168 lb (76.2 kg)  10/24/20 171 lb 8 oz (77.8 kg)   BMI Readings from Last 3 Encounters:  12/20/20 26.79 kg/m  12/05/20 27.12 kg/m  10/24/20 27.68 kg/m    Assessment/Interventions: Review of patient past medical history, allergies, medications, health status, including review of consultants reports, laboratory and other test  data, was performed as part of comprehensive evaluation and provision of chronic care management services.   SDOH:  (Social Determinants of Health) assessments and interventions performed: Yes SDOH Interventions    Flowsheet Row Most Recent Value  SDOH Interventions   Financial Strain Interventions Intervention Not Indicated      SDOH Screenings   Alcohol Screen: Not on file  Depression (PHQ2-9): Medium Risk   PHQ-2 Score: 16  Financial Resource Strain: Low Risk    Difficulty of Paying Living Expenses: Not very hard  Food Insecurity: Not on file  Housing: Not on file  Physical Activity: Not on file  Social Connections: Not on file  Stress: Not on file  Tobacco Use: High Risk   Smoking Tobacco Use: Every Day   Smokeless Tobacco Use: Never  Transportation Needs: Not on file    CCM Care Plan  Allergies  Allergen Reactions   Peanut Butter Flavor Anaphylaxis   Peanut-Containing Drug Products Anaphylaxis    Medications Reviewed Today     Reviewed by Debbora Dus, [facility] (Pharmacist) on 12/25/20 at 1329  Med List Status: <None>   Medication Order Taking? Sig Documenting Provider Last Dose Status Informant  albuterol (PROVENTIL HFA;VENTOLIN HFA) 108 (90 Base) MCG/ACT inhaler [id number]  Inhale 1-2 puffs into the lungs every 6 (six) hours as needed for wheezing or shortness of breath. [provider]  Active Self  aspirin EC 81 MG tablet [id number] Yes Take 81 mg by mouth in the morning. Swallow whole. [provider] Taking Active Self  atorvastatin (LIPITOR) 40 MG tablet [id number] Yes Take 1 tablet (40 mg total) by mouth daily. Elby Beck, FNP Taking Active Self  Biotin 5000 MCG TABS [id number]  Take 5,000 mcg by mouth in the morning. [provider]  Active Self  Biotin w/ Vitamins C & E (HAIR/SKIN/NAILS PO) [id number]  Take 1 capsule by mouth in the morning. [provider]  Active Self  BLACK CURRANT SEED OIL PO [id number]  Take 1  capsule by mouth in the morning. [provider]  Active Self  blood glucose meter kit and supplies KIT [id number]  Dispense based on patient and insurance preference. Use up to four times daily as directed. Lesleigh Noe, MD  Active Self  Calcium Carbonate-Vitamin D (CVS CALCIUM 600 + D PO) [id number]  Take 1 tablet by mouth in the morning. Takes 3 times per week [provider]  Active Self  dapagliflozin propanediol (FARXIGA) 10 MG TABS tablet [id number] Yes Take 1 tablet (10 mg total) by mouth daily. Lesleigh Noe, MD Taking Active Self  diphenhydrAMINE (BENADRYL) 25 MG tablet [id number] Yes Take 25 mg by mouth daily. [provider] Taking Active Self  docusate sodium (COLACE) 50 MG capsule [id number]  Take 50 mg by mouth in the morning. [provider]  Active Self  ibuprofen (ADVIL) 200 MG tablet [id number] No Take 600 mg by mouth in the morning and at bedtime.  Patient not taking: Reported on 12/25/2020   [provider] Not Taking Active Self  lisinopril (ZESTRIL) 20 MG tablet [id number] Yes Take 20 mg by mouth daily. [provider] Taking Active Self  Omega-3 Fatty Acids (FISH OIL PO) [id number]  Take 2,400 mg by mouth in the morning. [provider]  Active Self  omeprazole (PRILOSEC) 20 MG capsule [id number] Yes Take 1 capsule (20 mg total) by mouth daily. Lesleigh Noe, MD Taking Active Self  sitaGLIPtin (JANUVIA) 100 MG tablet [id number] Yes Take 1 tablet (100 mg total) by mouth daily. Lesleigh Noe, MD Taking Active Self            Patient Active Problem List   Diagnosis Date Noted   Left breast abscess 12/05/2020   Tobacco use 12/05/2020   Fatty liver disease, nonalcoholic 81/82/9937   Arthritis of left hip 09/20/2020   Assistance needed with transportation 09/20/2020   Hepatomegaly 05/17/2020   Alcohol abuse 01/19/2018   Hyperparathyroidism ([place]) 01/19/2018   Mixed hyperlipidemia 01/19/2018   Class 1 obesity  with body mass index (BMI) of 33.0 to 33.9 in adult 05/27/2017   Essential hypertension 05/27/2017   Gastroesophageal reflux disease without esophagitis  05/27/2017   Mucopurulent chronic bronchitis ([place]) 05/27/2017   Type 2 diabetes mellitus with other specified complication ([place]) 93/73/4287    Immunization History  Administered Date(s) Administered   Moderna Sars-Covid-2 Vaccination 07/11/2019, 08/11/2019    Conditions to be addressed/monitored:  Hypertension and Diabetes  Care Plan : [place]  Updates made by Debbora Dus, [facility] since 12/25/2020 12:00 AM     Problem: Disease Management   Onset Date: 12/25/2020  Note:    Current Barriers:  Suboptimal control - diabetes  Pharmacist Clinical Goal(s):  Patient will achieve improvement in diabetes control as evidenced by A1c through collaboration with PharmD and provider.   Interventions: 1:1 collaboration with Lesleigh Noe, MD regarding development and update of comprehensive plan of care as evidenced by provider attestation and co-signature Inter-disciplinary care team collaboration (see longitudinal plan of care) Comprehensive medication review performed; medication list updated in electronic medical record  Hypertension (BP goal <140/90) -Controlled - per clinic readings -Current treatment: Lisinopril 20 mg - 1 tablet daily  -Medications previously tried: none reported  -Current home readings: none reported -Denies hypotensive/hypertensive symptoms -Reviewed adherence, no gaps in fill history -Recommended to continue current medication  Diabetes (A1c goal <7%) -Uncontrolled - A1c 9.6% (10/24/20). Pt reports sugars have improved with better diet (avoiding starches, eating more vegetables). In July she went on cruise and ate very poorly resulting in high A1c. Prior to this A1c was 5.8% on just [place]. She is also scheduled with nutritionist next week. -Current medications: Januvia 100 mg - 1 tablet  daily (started August 2022) Farxiga 10 mg - 1 tablet daily -Medications previously tried: none   -Current home glucose readings fasting glucose:  12/24/20 - 161 12/23/20 - 158 12/22/20 - 171 12/20/20 - 206 12/19/20 - 219 (cheeseburger/mcdonalds the night before) 12/18/20 - 175 12/17/20 - 174 12/16/20 - 177 12/15/20 - 131 12/14/20 - 137 post prandial glucose: n/a -Denies hypoglycemic/hyperglycemic symptoms (prior had vaginal burning but this has improved) -Current meal patterns: she tries to use sugar free substitutes, more veggies and less starches -Current exercise: walking her puppy  -Educated on A1c and blood sugar goals; -Counseled to check feet daily and get yearly eye exams - she is due for annual eye exam  -She would prefer to avoid injections at this time. I would not consider metformin off the table. It can be beneficial in fatty liver. -Recommended to continue current medication; Pt will continue to work on diet/meet with nutritionist. Follow up with CCM in 4 weeks (01/22/21)   Patient Goals/Self-Care Activities Patient will:  - take medications as prescribed check glucose twice daily, document, and provide at future appointments  Follow Up Plan: Telephone follow up appointment with care management team member scheduled for:  4 weeks        Medication Assistance: None required.  Patient affirms current coverage meets needs.  Compliance/Adherence/Medication fill history: Care Gaps: DEXA Scan - DUE Eye Exam - DUE (pt waiting to hear back from referral)  Star Rating Drugs:  Medication:                Last Fill:         Day Supply Farxiga 18m              11/27/2020      30 Januvia 1025m          10/24/2020      90         Atorvastatin 4044m  11/27/2020      30  Lisinopril 41m            11/26/2020      30            [facility]  [facility] [place]Phone: [phone]: [phone] Uses pill box? Yes Pt endorses 100% compliance  Care Plan and Follow Up Patient Decision:  Patient agrees to Care Plan and Follow-up.   MDebbora Dus PharmD Clinical Pharmacist [phone]

## 2020-12-31 ENCOUNTER — Ambulatory Visit: Payer: Medicare Other | Admitting: *Deleted

## 2021-01-01 ENCOUNTER — Encounter: Payer: Self-pay | Admitting: Family Medicine

## 2021-01-03 ENCOUNTER — Other Ambulatory Visit: Payer: Self-pay

## 2021-01-03 ENCOUNTER — Other Ambulatory Visit
Admission: RE | Admit: 2021-01-03 | Discharge: 2021-01-03 | Disposition: A | Discharge status: Home / Self Care | Payer: Medicare Other | Source: Ambulatory Visit | Attending: Orthopedic Surgery | Admitting: Orthopedic Surgery

## 2021-01-03 ENCOUNTER — Other Ambulatory Visit: Payer: Medicare Other

## 2021-01-03 DIAGNOSIS — Z20822 Contact with and (suspected) exposure to covid-19: Secondary | ICD-10-CM | POA: Diagnosis not present

## 2021-01-03 DIAGNOSIS — Z01812 Encounter for preprocedural laboratory examination: Secondary | ICD-10-CM | POA: Diagnosis present

## 2021-01-03 LAB — TYPE AND SCREEN
ABO/RH(D): O POS
Antibody Screen: NEGATIVE

## 2021-01-03 LAB — SARS CORONAVIRUS 2 (TAT 6-24 HRS): SARS Coronavirus 2: NEGATIVE

## 2021-01-04 DIAGNOSIS — Z96642 Presence of left artificial hip joint: Secondary | ICD-10-CM | POA: Insufficient documentation

## 2021-01-07 ENCOUNTER — Encounter
Admission: RE | Disposition: A | Discharge status: Home / Self Care | Payer: Self-pay | Source: Ambulatory Visit | Attending: Orthopedic Surgery

## 2021-01-07 ENCOUNTER — Ambulatory Visit
Admission: RE | Admit: 2021-01-07 | Discharge: 2021-01-07 | DRG: 554 | Disposition: A | Discharge status: Home / Self Care | Payer: Medicare Other | Source: Ambulatory Visit | Attending: Orthopedic Surgery | Admitting: Orthopedic Surgery

## 2021-01-07 ENCOUNTER — Encounter: Payer: Self-pay | Admitting: Orthopedic Surgery

## 2021-01-07 DIAGNOSIS — Z419 Encounter for procedure for purposes other than remedying health state, unspecified: Secondary | ICD-10-CM

## 2021-01-07 DIAGNOSIS — Z538 Procedure and treatment not carried out for other reasons: Secondary | ICD-10-CM | POA: Diagnosis present

## 2021-01-07 DIAGNOSIS — M1612 Unilateral primary osteoarthritis, left hip: Secondary | ICD-10-CM | POA: Diagnosis present

## 2021-01-07 LAB — GLUCOSE, CAPILLARY: Glucose-Capillary: 250 mg/dL — ABNORMAL HIGH (ref 70–99)

## 2021-01-07 SURGERY — ARTHROPLASTY, HIP, TOTAL, ANTERIOR APPROACH
Anesthesia: Spinal | Site: Hip | Laterality: Left

## 2021-01-07 MED ORDER — CEFAZOLIN SODIUM-DEXTROSE 2-4 GM/100ML-% IV SOLN: 2.0000 g | INTRAVENOUS | Status: DC

## 2021-01-07 MED ORDER — LACTATED RINGERS IV SOLN: INTRAVENOUS | Status: DC

## 2021-01-07 MED ORDER — SODIUM CHLORIDE 0.9 % IV SOLN: INTRAVENOUS | Status: DC

## 2021-01-07 MED ORDER — CEFAZOLIN SODIUM-DEXTROSE 2-4 GM/100ML-% IV SOLN
INTRAVENOUS | Status: AC
  Filled 2021-01-07: qty 100

## 2021-01-07 MED ORDER — CHLORHEXIDINE GLUCONATE 0.12 % MT SOLN
OROMUCOSAL | Status: AC
  Filled 2021-01-07: qty 15

## 2021-01-07 MED ORDER — ORAL CARE MOUTH RINSE: 15.0000 mL | Freq: Once | OROMUCOSAL | Status: DC

## 2021-01-07 MED ORDER — TRANEXAMIC ACID-NACL 1000-0.7 MG/100ML-% IV SOLN: 1000.0000 mg | INTRAVENOUS | Status: DC

## 2021-01-07 MED ORDER — CHLORHEXIDINE GLUCONATE 0.12 % MT SOLN: 15.0000 mL | Freq: Once | OROMUCOSAL | Status: DC

## 2021-01-07 MED ORDER — TRANEXAMIC ACID-NACL 1000-0.7 MG/100ML-% IV SOLN
INTRAVENOUS | Status: AC
  Filled 2021-01-07: qty 100

## 2021-01-07 MED ORDER — BUPIVACAINE-EPINEPHRINE (PF) 0.25% -1:200000 IJ SOLN
INTRAMUSCULAR | Status: AC
  Filled 2021-01-07: qty 30

## 2021-01-07 MED ORDER — POVIDONE-IODINE 10 % EX SWAB: 2.0000 "application " | Freq: Once | CUTANEOUS | Status: DC

## 2021-01-07 SURGICAL SUPPLY — 45 items
BLADE SAGITTAL WIDE XTHICK NO (BLADE) ×2 IMPLANT
BRUSH SCRUB EZ  4% CHG (MISCELLANEOUS) ×2
BRUSH SCRUB EZ 4% CHG (MISCELLANEOUS) ×2 IMPLANT
CHLORAPREP W/TINT 26 (MISCELLANEOUS) ×2 IMPLANT
DRAPE 3/4 80X56 (DRAPES) ×2 IMPLANT
DRAPE C-ARM 42X72 X-RAY (DRAPES) ×2 IMPLANT
DRAPE STERI IOBAN 125X83 (DRAPES) IMPLANT
DRSG AQUACEL AG ADV 3.5X10 (GAUZE/BANDAGES/DRESSINGS) IMPLANT
DRSG AQUACEL AG ADV 3.5X14 (GAUZE/BANDAGES/DRESSINGS) IMPLANT
ELECT REM PT RETURN 9FT ADLT (ELECTROSURGICAL) ×2
ELECTRODE REM PT RTRN 9FT ADLT (ELECTROSURGICAL) ×1 IMPLANT
GAUZE 4X4 16PLY ~~LOC~~+RFID DBL (SPONGE) ×2 IMPLANT
GAUZE XEROFORM 1X8 LF (GAUZE/BANDAGES/DRESSINGS) IMPLANT
GLOVE SURG ORTHO LTX SZ8 (GLOVE) ×4 IMPLANT
GLOVE SURG UNDER LTX SZ8 (GLOVE) ×2 IMPLANT
GOWN STRL REUS W/ TWL LRG LVL3 (GOWN DISPOSABLE) ×1 IMPLANT
GOWN STRL REUS W/ TWL XL LVL3 (GOWN DISPOSABLE) ×1 IMPLANT
GOWN STRL REUS W/TWL LRG LVL3 (GOWN DISPOSABLE) ×1
GOWN STRL REUS W/TWL XL LVL3 (GOWN DISPOSABLE) ×1
IRRIGATION SURGIPHOR STRL (IV SOLUTION) IMPLANT
IV NS 1000ML (IV SOLUTION) ×1
IV NS 1000ML BAXH (IV SOLUTION) ×1 IMPLANT
KIT PATIENT CARE HANA TABLE (KITS) ×2 IMPLANT
KIT TURNOVER CYSTO (KITS) ×2 IMPLANT
MANIFOLD NEPTUNE II (INSTRUMENTS) ×2 IMPLANT
MAT ABSORB  FLUID 56X50 GRAY (MISCELLANEOUS) ×1
MAT ABSORB FLUID 56X50 GRAY (MISCELLANEOUS) ×1 IMPLANT
NDL SAFETY ECLIPSE 18X1.5 (NEEDLE) IMPLANT
NEEDLE HYPO 18GX1.5 SHARP (NEEDLE)
NEEDLE HYPO 22GX1.5 SAFETY (NEEDLE) ×2 IMPLANT
NEEDLE SPNL 20GX3.5 QUINCKE YW (NEEDLE) ×2 IMPLANT
PACK HIP PROSTHESIS (MISCELLANEOUS) ×2 IMPLANT
PADDING CAST BLEND 4X4 NS (MISCELLANEOUS) ×4 IMPLANT
PILLOW ABDUCTION MEDIUM (MISCELLANEOUS) ×2 IMPLANT
PULSAVAC PLUS IRRIG FAN TIP (DISPOSABLE) ×2
SPONGE T-LAP 18X18 ~~LOC~~+RFID (SPONGE) ×8 IMPLANT
STAPLER SKIN PROX 35W (STAPLE) ×2 IMPLANT
SUT BONE WAX W31G (SUTURE) ×2 IMPLANT
SUT DVC 2 QUILL PDO  T11 36X36 (SUTURE) ×1
SUT DVC 2 QUILL PDO T11 36X36 (SUTURE) ×1 IMPLANT
SUT VIC AB 2-0 CT1 18 (SUTURE) ×2 IMPLANT
SYR 20ML LL LF (SYRINGE) ×2 IMPLANT
TIP FAN IRRIG PULSAVAC PLUS (DISPOSABLE) ×1 IMPLANT
WAND WEREWOLF FASTSEAL 6.0 (MISCELLANEOUS) IMPLANT
WATER STERILE IRR 500ML POUR (IV SOLUTION) ×2 IMPLANT

## 2021-01-07 NOTE — Progress Notes (Signed)
Patient reports having creamer in her coffee at 0900 am this morning.  RN explained possibility of procedure being delayed.  RN informed both Dr Rosey Bath and Dr Harlow Mares; procedure would need to be delayed until after 1500pm.  Patient informed

## 2021-01-07 NOTE — Progress Notes (Signed)
Case canceled due to patient having creamer in coffee.  Schedule would not permit delay in surgery, will be rescheduled by Dr Serita Butcher office.

## 2021-01-07 NOTE — H&P (Signed)
Patient cancelled for her total hip today due to her NPO status on arrival. Will reschedule ASAP.

## 2021-01-08 ENCOUNTER — Other Ambulatory Visit: Payer: Self-pay | Admitting: Orthopedic Surgery

## 2021-01-10 MED ORDER — ORAL CARE MOUTH RINSE: 15.0000 mL | Freq: Once | OROMUCOSAL | Status: AC

## 2021-01-10 MED ORDER — CHLORHEXIDINE GLUCONATE 0.12 % MT SOLN: 15.0000 mL | Freq: Once | OROMUCOSAL | Status: AC

## 2021-01-10 MED ORDER — TRANEXAMIC ACID-NACL 1000-0.7 MG/100ML-% IV SOLN
1000.0000 mg | INTRAVENOUS | Status: AC
  Administered 2021-01-11: 1000 mg via INTRAVENOUS

## 2021-01-10 MED ORDER — SODIUM CHLORIDE 0.9 % IV SOLN: INTRAVENOUS | Status: DC

## 2021-01-10 MED ORDER — CEFAZOLIN SODIUM-DEXTROSE 2-4 GM/100ML-% IV SOLN
2.0000 g | INTRAVENOUS | Status: AC
  Administered 2021-01-11: 2 g via INTRAVENOUS

## 2021-01-10 MED ORDER — POVIDONE-IODINE 10 % EX SWAB: 2.0000 "application " | Freq: Once | CUTANEOUS | Status: DC

## 2021-01-11 ENCOUNTER — Encounter: Payer: Self-pay | Admitting: Orthopedic Surgery

## 2021-01-11 ENCOUNTER — Inpatient Hospital Stay: Payer: Medicare Other | Admitting: Anesthesiology

## 2021-01-11 ENCOUNTER — Encounter
Admission: RE | Disposition: A | Discharge status: Home Health | Payer: Self-pay | Source: Ambulatory Visit | Attending: Orthopedic Surgery

## 2021-01-11 ENCOUNTER — Other Ambulatory Visit: Payer: Self-pay

## 2021-01-11 ENCOUNTER — Inpatient Hospital Stay
Admission: RE | Admit: 2021-01-11 | Discharge: 2021-01-15 | DRG: 470 | Disposition: A | Discharge status: Home Health | Payer: Medicare Other | Source: Ambulatory Visit | Attending: Orthopedic Surgery | Admitting: Orthopedic Surgery

## 2021-01-11 ENCOUNTER — Inpatient Hospital Stay: Payer: Medicare Other

## 2021-01-11 DIAGNOSIS — E1169 Type 2 diabetes mellitus with other specified complication: Secondary | ICD-10-CM | POA: Diagnosis not present

## 2021-01-11 DIAGNOSIS — M1612 Unilateral primary osteoarthritis, left hip: Principal | ICD-10-CM | POA: Diagnosis present

## 2021-01-11 DIAGNOSIS — K219 Gastro-esophageal reflux disease without esophagitis: Secondary | ICD-10-CM | POA: Diagnosis present

## 2021-01-11 DIAGNOSIS — E119 Type 2 diabetes mellitus without complications: Secondary | ICD-10-CM | POA: Diagnosis present

## 2021-01-11 DIAGNOSIS — Z91010 Allergy to peanuts: Secondary | ICD-10-CM | POA: Diagnosis not present

## 2021-01-11 DIAGNOSIS — Z96642 Presence of left artificial hip joint: Secondary | ICD-10-CM

## 2021-01-11 DIAGNOSIS — I10 Essential (primary) hypertension: Secondary | ICD-10-CM | POA: Diagnosis present

## 2021-01-11 DIAGNOSIS — E7800 Pure hypercholesterolemia, unspecified: Secondary | ICD-10-CM | POA: Diagnosis present

## 2021-01-11 DIAGNOSIS — Z419 Encounter for procedure for purposes other than remedying health state, unspecified: Secondary | ICD-10-CM

## 2021-01-11 HISTORY — PX: TOTAL HIP ARTHROPLASTY: SHX124

## 2021-01-11 LAB — GLUCOSE, CAPILLARY
Glucose-Capillary: 160 mg/dL — ABNORMAL HIGH (ref 70–99)
Glucose-Capillary: 141 mg/dL — ABNORMAL HIGH (ref 70–99)

## 2021-01-11 LAB — TYPE AND SCREEN
ABO/RH(D): O POS
Antibody Screen: NEGATIVE

## 2021-01-11 SURGERY — ARTHROPLASTY, HIP, TOTAL, ANTERIOR APPROACH
Anesthesia: Spinal | Site: Hip | Laterality: Left

## 2021-01-11 MED ORDER — BUPIVACAINE-EPINEPHRINE (PF) 0.25% -1:200000 IJ SOLN
INTRAMUSCULAR | Status: DC | PRN
  Administered 2021-01-11: 30 mL

## 2021-01-11 MED ORDER — FENTANYL CITRATE (PF) 100 MCG/2ML IJ SOLN
INTRAMUSCULAR | Status: AC
  Filled 2021-01-11: qty 2

## 2021-01-11 MED ORDER — MIDAZOLAM HCL 2 MG/2ML IJ SOLN
INTRAMUSCULAR | Status: AC
  Filled 2021-01-11: qty 2

## 2021-01-11 MED ORDER — CEFAZOLIN SODIUM-DEXTROSE 1-4 GM/50ML-% IV SOLN
1.0000 g | Freq: Four times a day (QID) | INTRAVENOUS | Status: AC
  Administered 2021-01-11 (×2): 1 g via INTRAVENOUS
  Filled 2021-01-11 (×2): qty 50

## 2021-01-11 MED ORDER — TRANEXAMIC ACID-NACL 1000-0.7 MG/100ML-% IV SOLN
INTRAVENOUS | Status: AC
  Filled 2021-01-11: qty 100

## 2021-01-11 MED ORDER — LINAGLIPTIN 5 MG PO TABS
5.0000 mg | ORAL_TABLET | Freq: Every day | ORAL | Status: DC
  Administered 2021-01-12 – 2021-01-15 (×4): 5 mg via ORAL
  Filled 2021-01-11 (×4): qty 1

## 2021-01-11 MED ORDER — ACETAMINOPHEN 10 MG/ML IV SOLN: 1000.0000 mg | Freq: Once | INTRAVENOUS | Status: DC | PRN

## 2021-01-11 MED ORDER — LACTATED RINGERS IV SOLN: INTRAVENOUS | Status: DC

## 2021-01-11 MED ORDER — ASPIRIN 81 MG PO CHEW
81.0000 mg | CHEWABLE_TABLET | Freq: Two times a day (BID) | ORAL | Status: DC
  Administered 2021-01-11 – 2021-01-15 (×8): 81 mg via ORAL
  Filled 2021-01-11 (×8): qty 1

## 2021-01-11 MED ORDER — SODIUM CHLORIDE 0.9 % IR SOLN
Status: DC | PRN
  Administered 2021-01-11: 3000 mL

## 2021-01-11 MED ORDER — LISINOPRIL 20 MG PO TABS
20.0000 mg | ORAL_TABLET | Freq: Every day | ORAL | Status: DC
  Administered 2021-01-12 – 2021-01-15 (×4): 20 mg via ORAL
  Filled 2021-01-11 (×5): qty 1

## 2021-01-11 MED ORDER — MAGNESIUM HYDROXIDE 400 MG/5ML PO SUSP
30.0000 mL | Freq: Every day | ORAL | Status: DC | PRN
  Filled 2021-01-11 (×2): qty 30

## 2021-01-11 MED ORDER — BUPIVACAINE-EPINEPHRINE (PF) 0.25% -1:200000 IJ SOLN
INTRAMUSCULAR | Status: AC
  Filled 2021-01-11: qty 30

## 2021-01-11 MED ORDER — KETOROLAC TROMETHAMINE 15 MG/ML IJ SOLN
7.5000 mg | Freq: Four times a day (QID) | INTRAMUSCULAR | Status: AC
  Administered 2021-01-11 – 2021-01-12 (×4): 7.5 mg via INTRAVENOUS
  Filled 2021-01-11 (×4): qty 1

## 2021-01-11 MED ORDER — ALUM & MAG HYDROXIDE-SIMETH 200-200-20 MG/5ML PO SUSP: 30.0000 mL | ORAL | Status: DC | PRN

## 2021-01-11 MED ORDER — GLYCOPYRROLATE 0.2 MG/ML IJ SOLN
INTRAMUSCULAR | Status: AC
  Filled 2021-01-11: qty 1

## 2021-01-11 MED ORDER — MORPHINE SULFATE (PF) 2 MG/ML IV SOLN
0.5000 mg | INTRAVENOUS | Status: DC | PRN
Start: 2021-01-11 — End: 2021-01-15
  Administered 2021-01-11: 1 mg via INTRAVENOUS
  Filled 2021-01-11 (×2): qty 1

## 2021-01-11 MED ORDER — METOCLOPRAMIDE HCL 5 MG/ML IJ SOLN: 5.0000 mg | Freq: Three times a day (TID) | INTRAMUSCULAR | Status: DC | PRN

## 2021-01-11 MED ORDER — OXYCODONE HCL 5 MG PO TABS: 5.0000 mg | ORAL_TABLET | Freq: Once | ORAL | Status: DC | PRN

## 2021-01-11 MED ORDER — PHENYLEPHRINE HCL (PRESSORS) 10 MG/ML IV SOLN
INTRAVENOUS | Status: DC | PRN
  Administered 2021-01-11 (×3): 100 ug via INTRAVENOUS
  Administered 2021-01-11: 200 ug via INTRAVENOUS
  Administered 2021-01-11 (×6): 100 ug via INTRAVENOUS

## 2021-01-11 MED ORDER — PROPOFOL 1000 MG/100ML IV EMUL
INTRAVENOUS | Status: AC
  Filled 2021-01-11: qty 100

## 2021-01-11 MED ORDER — ONDANSETRON HCL 4 MG/2ML IJ SOLN: 4.0000 mg | Freq: Four times a day (QID) | INTRAMUSCULAR | Status: DC | PRN

## 2021-01-11 MED ORDER — HYDROCODONE-ACETAMINOPHEN 5-325 MG PO TABS
1.0000 | ORAL_TABLET | ORAL | Status: DC | PRN
  Administered 2021-01-11 (×2): 2 via ORAL
  Administered 2021-01-13 (×3): 1 via ORAL
  Administered 2021-01-13 – 2021-01-15 (×6): 2 via ORAL
  Filled 2021-01-11 (×4): qty 2
  Filled 2021-01-11 (×3): qty 1
  Filled 2021-01-11 (×5): qty 2

## 2021-01-11 MED ORDER — ACETAMINOPHEN 325 MG PO TABS: 325.0000 mg | ORAL_TABLET | Freq: Four times a day (QID) | ORAL | Status: DC | PRN

## 2021-01-11 MED ORDER — CEFAZOLIN SODIUM-DEXTROSE 2-4 GM/100ML-% IV SOLN
INTRAVENOUS | Status: AC
  Filled 2021-01-11: qty 100

## 2021-01-11 MED ORDER — BUPIVACAINE HCL (PF) 0.5 % IJ SOLN
INTRAMUSCULAR | Status: AC
  Filled 2021-01-11: qty 10

## 2021-01-11 MED ORDER — LIDOCAINE HCL (PF) 2 % IJ SOLN
INTRAMUSCULAR | Status: DC | PRN
  Administered 2021-01-11: 50 mg

## 2021-01-11 MED ORDER — MENTHOL 3 MG MT LOZG
1.0000 | LOZENGE | OROMUCOSAL | Status: DC | PRN
  Filled 2021-01-11: qty 9

## 2021-01-11 MED ORDER — BUPIVACAINE HCL (PF) 0.5 % IJ SOLN
INTRAMUSCULAR | Status: DC | PRN
  Administered 2021-01-11: 2.2 mL via INTRATHECAL

## 2021-01-11 MED ORDER — PHENOL 1.4 % MT LIQD
1.0000 | OROMUCOSAL | Status: DC | PRN
  Filled 2021-01-11: qty 177

## 2021-01-11 MED ORDER — PROPOFOL 500 MG/50ML IV EMUL
INTRAVENOUS | Status: DC | PRN
  Administered 2021-01-11: 50 ug/kg/min via INTRAVENOUS

## 2021-01-11 MED ORDER — ONDANSETRON HCL 4 MG PO TABS: 4.0000 mg | ORAL_TABLET | Freq: Four times a day (QID) | ORAL | Status: DC | PRN

## 2021-01-11 MED ORDER — MIDAZOLAM HCL 5 MG/5ML IJ SOLN
INTRAMUSCULAR | Status: DC | PRN
  Administered 2021-01-11 (×2): 1 mg via INTRAVENOUS
  Administered 2021-01-11: 2 mg via INTRAVENOUS

## 2021-01-11 MED ORDER — CHLORHEXIDINE GLUCONATE 0.12 % MT SOLN
OROMUCOSAL | Status: AC
  Administered 2021-01-11: 15 mL via OROMUCOSAL
  Filled 2021-01-11: qty 15

## 2021-01-11 MED ORDER — OXYCODONE HCL 5 MG/5ML PO SOLN: 5.0000 mg | Freq: Once | ORAL | Status: DC | PRN

## 2021-01-11 MED ORDER — FENTANYL CITRATE (PF) 100 MCG/2ML IJ SOLN: 25.0000 ug | INTRAMUSCULAR | Status: DC | PRN

## 2021-01-11 MED ORDER — DIPHENHYDRAMINE HCL 12.5 MG/5ML PO ELIX
12.5000 mg | ORAL_SOLUTION | ORAL | Status: DC | PRN
Start: 2021-01-11 — End: 2021-01-15

## 2021-01-11 MED ORDER — SURGIRINSE WOUND IRRIGATION SYSTEM - OPTIME: TOPICAL | Status: DC | PRN

## 2021-01-11 MED ORDER — ALBUTEROL SULFATE (2.5 MG/3ML) 0.083% IN NEBU: 2.5000 mg | INHALATION_SOLUTION | Freq: Four times a day (QID) | RESPIRATORY_TRACT | Status: DC | PRN

## 2021-01-11 MED ORDER — HYDROCODONE-ACETAMINOPHEN 7.5-325 MG PO TABS
1.0000 | ORAL_TABLET | ORAL | Status: DC | PRN
  Administered 2021-01-12: 2 via ORAL
  Administered 2021-01-13: 1 via ORAL
  Administered 2021-01-14: 2 via ORAL
  Filled 2021-01-11 (×2): qty 2
  Filled 2021-01-11: qty 1

## 2021-01-11 MED ORDER — STERILE WATER FOR IRRIGATION IR SOLN
Status: DC | PRN
  Administered 2021-01-11: 1000 mL

## 2021-01-11 MED ORDER — ONDANSETRON HCL 4 MG/2ML IJ SOLN: 4.0000 mg | Freq: Once | INTRAMUSCULAR | Status: DC | PRN

## 2021-01-11 MED ORDER — DAPAGLIFLOZIN PROPANEDIOL 5 MG PO TABS
10.0000 mg | ORAL_TABLET | Freq: Every day | ORAL | Status: DC
  Administered 2021-01-12 – 2021-01-14 (×3): 10 mg via ORAL
  Filled 2021-01-11: qty 1
  Filled 2021-01-11: qty 2
  Filled 2021-01-11 (×2): qty 1
  Filled 2021-01-11 (×2): qty 2

## 2021-01-11 MED ORDER — DOCUSATE SODIUM 100 MG PO CAPS
100.0000 mg | ORAL_CAPSULE | Freq: Two times a day (BID) | ORAL | Status: DC
  Administered 2021-01-11 – 2021-01-15 (×7): 100 mg via ORAL
  Filled 2021-01-11 (×8): qty 1

## 2021-01-11 MED ORDER — EPHEDRINE SULFATE 50 MG/ML IJ SOLN
INTRAMUSCULAR | Status: DC | PRN
  Administered 2021-01-11 (×2): 10 mg via INTRAVENOUS
  Administered 2021-01-11: 5 mg via INTRAVENOUS

## 2021-01-11 MED ORDER — PANTOPRAZOLE SODIUM 40 MG PO TBEC
40.0000 mg | DELAYED_RELEASE_TABLET | Freq: Every day | ORAL | Status: DC
  Administered 2021-01-12 – 2021-01-15 (×4): 40 mg via ORAL
  Filled 2021-01-11 (×5): qty 1

## 2021-01-11 MED ORDER — BISACODYL 10 MG RE SUPP: 10.0000 mg | Freq: Every day | RECTAL | Status: DC | PRN

## 2021-01-11 MED ORDER — ATORVASTATIN CALCIUM 20 MG PO TABS
40.0000 mg | ORAL_TABLET | Freq: Every day | ORAL | Status: DC
  Administered 2021-01-12 – 2021-01-15 (×4): 40 mg via ORAL
  Filled 2021-01-11 (×5): qty 2

## 2021-01-11 MED ORDER — 0.9 % SODIUM CHLORIDE (POUR BTL) OPTIME
TOPICAL | Status: DC | PRN
  Administered 2021-01-11: 500 mL

## 2021-01-11 MED ORDER — FENTANYL CITRATE (PF) 100 MCG/2ML IJ SOLN
INTRAMUSCULAR | Status: DC | PRN
  Administered 2021-01-11 (×2): 50 ug via INTRAVENOUS

## 2021-01-11 MED ORDER — METOCLOPRAMIDE HCL 10 MG PO TABS: 5.0000 mg | ORAL_TABLET | Freq: Three times a day (TID) | ORAL | Status: DC | PRN

## 2021-01-11 SURGICAL SUPPLY — 53 items
BLADE SAGITTAL WIDE XTHICK NO (BLADE) ×2 IMPLANT
BRUSH SCRUB EZ  4% CHG (MISCELLANEOUS) ×2
BRUSH SCRUB EZ 4% CHG (MISCELLANEOUS) ×2 IMPLANT
CHLORAPREP W/TINT 26 (MISCELLANEOUS) ×2 IMPLANT
COVER HOLE (Hips) ×2 IMPLANT
CUP R3 52MM (Hips) ×2 IMPLANT
DRAPE 3/4 80X56 (DRAPES) ×2 IMPLANT
DRAPE C-ARM 42X72 X-RAY (DRAPES) ×2 IMPLANT
DRAPE STERI IOBAN 125X83 (DRAPES) IMPLANT
DRSG AQUACEL AG ADV 3.5X10 (GAUZE/BANDAGES/DRESSINGS) ×2 IMPLANT
DRSG AQUACEL AG ADV 3.5X14 (GAUZE/BANDAGES/DRESSINGS) IMPLANT
ELECT REM PT RETURN 9FT ADLT (ELECTROSURGICAL) ×2
ELECTRODE REM PT RTRN 9FT ADLT (ELECTROSURGICAL) ×1 IMPLANT
GAUZE 4X4 16PLY ~~LOC~~+RFID DBL (SPONGE) ×2 IMPLANT
GAUZE XEROFORM 1X8 LF (GAUZE/BANDAGES/DRESSINGS) IMPLANT
GLOVE SURG ORTHO LTX SZ8 (GLOVE) ×4 IMPLANT
GLOVE SURG UNDER LTX SZ8 (GLOVE) ×2 IMPLANT
GOWN STRL REUS W/ TWL LRG LVL3 (GOWN DISPOSABLE) ×1 IMPLANT
GOWN STRL REUS W/ TWL XL LVL3 (GOWN DISPOSABLE) ×1 IMPLANT
GOWN STRL REUS W/TWL LRG LVL3 (GOWN DISPOSABLE) ×1
GOWN STRL REUS W/TWL XL LVL3 (GOWN DISPOSABLE) ×1
HEAD FEMORAL TAPER 36MM P0 (Head) ×2 IMPLANT
IRRIGATION SURGIPHOR STRL (IV SOLUTION) ×2 IMPLANT
IV NS 1000ML (IV SOLUTION)
IV NS 1000ML BAXH (IV SOLUTION) IMPLANT
KIT PATIENT CARE HANA TABLE (KITS) ×2 IMPLANT
KIT TURNOVER CYSTO (KITS) ×2 IMPLANT
LINER ACETAB 0 DEG (Liner) ×2 IMPLANT
MANIFOLD NEPTUNE II (INSTRUMENTS) ×2 IMPLANT
MAT ABSORB  FLUID 56X50 GRAY (MISCELLANEOUS) ×1
MAT ABSORB FLUID 56X50 GRAY (MISCELLANEOUS) ×1 IMPLANT
NDL SAFETY ECLIPSE 18X1.5 (NEEDLE) IMPLANT
NEEDLE HYPO 18GX1.5 SHARP (NEEDLE)
NEEDLE HYPO 22GX1.5 SAFETY (NEEDLE) ×2 IMPLANT
NEEDLE SPNL 20GX3.5 QUINCKE YW (NEEDLE) ×2 IMPLANT
NS IRRIG 500ML POUR BTL (IV SOLUTION) ×2 IMPLANT
PACK HIP PROSTHESIS (MISCELLANEOUS) ×2 IMPLANT
PADDING CAST BLEND 4X4 NS (MISCELLANEOUS) ×4 IMPLANT
PILLOW ABDUCTION MEDIUM (MISCELLANEOUS) ×2 IMPLANT
PULSAVAC PLUS IRRIG FAN TIP (DISPOSABLE) ×2
SCREW 6.5X25MM (Screw) ×2 IMPLANT
SPONGE T-LAP 18X18 ~~LOC~~+RFID (SPONGE) ×8 IMPLANT
STAPLER SKIN PROX 35W (STAPLE) ×2 IMPLANT
STEM POLAR STD SZ4 COLLAR (Stent) ×2 IMPLANT
SUT BONE WAX W31G (SUTURE) ×2 IMPLANT
SUT DVC 2 QUILL PDO  T11 36X36 (SUTURE) ×1
SUT DVC 2 QUILL PDO T11 36X36 (SUTURE) ×1 IMPLANT
SUT VIC AB 2-0 CT1 18 (SUTURE) ×2 IMPLANT
SYR 20ML LL LF (SYRINGE) ×2 IMPLANT
TIP FAN IRRIG PULSAVAC PLUS (DISPOSABLE) ×1 IMPLANT
WAND WEREWOLF FASTSEAL 6.0 (MISCELLANEOUS) ×2 IMPLANT
WATER STERILE IRR 1000ML POUR (IV SOLUTION) ×2 IMPLANT
WATER STERILE IRR 500ML POUR (IV SOLUTION) ×2 IMPLANT

## 2021-01-11 NOTE — Anesthesia Preprocedure Evaluation (Signed)
Anesthesia Evaluation  Patient identified by MRN, date of birth, ID band Patient awake    Reviewed: Allergy & Precautions, NPO status , Patient's Chart, lab work & pertinent test results  History of Anesthesia Complications Negative for: history of anesthetic complications  Airway Mallampati: II  TM Distance: >3 FB Neck ROM: Full    Dental  (+) Poor Dentition, Missing, Chipped   Pulmonary asthma , neg sleep apnea, neg COPD, Current SmokerPatient did not abstain from smoking.,  Chronic bronchitis   Pulmonary exam normal breath sounds clear to auscultation       Cardiovascular Exercise Tolerance: Good METShypertension, (-) CAD and (-) Past MI (-) dysrhythmias  Rhythm:Regular Rate:Normal - Systolic murmurs    Neuro/Psych negative neurological ROS  negative psych ROS   GI/Hepatic GERD  ,(+)     substance abuse  alcohol use, Early imaging signs of cirrhosis   Endo/Other  diabetes  Renal/GU negative Renal ROS     Musculoskeletal   Abdominal   Peds  Hematology   Anesthesia Other Findings Past Medical History: No date: Asthma No date: Diabetes mellitus without complication (HCC)     Comment:  diet controlled No date: Hypercholesteremia No date: Hypertension  Reproductive/Obstetrics                             Anesthesia Physical  Anesthesia Plan  ASA: 3  Anesthesia Plan: General   Post-op Pain Management:    Induction: Intravenous  PONV Risk Score and Plan: 2 and Ondansetron, Propofol infusion, TIVA, Midazolam and Dexamethasone  Airway Management Planned: Nasal Cannula  Additional Equipment: None  Intra-op Plan:   Post-operative Plan:   Informed Consent: I have reviewed the patients History and Physical, chart, labs and discussed the procedure including the risks, benefits and alternatives for the proposed anesthesia with the patient or authorized representative who has  indicated his/her understanding and acceptance.     Dental advisory given  Plan Discussed with: CRNA and Surgeon  Anesthesia Plan Comments: (Discussed R/B/A of neuraxial anesthesia technique with patient: - rare risks of spinal/epidural hematoma, nerve damage, infection - Risk of PDPH - Risk of nausea and vomiting - Risk of conversion to general anesthesia and its associated risks, including sore throat, damage to lips/teeth/oropharynx, and rare risks such as cardiac and respiratory events. - Risk of allergic reactions  Patient apprehensive about the thought of a spinal, but I suggested that avoiding an ETT in light of her smoking and chronic cough/bronchitis may be beneficial for her. She ultimately is OK with this plan. Patient counseled on benefits of smoking cessation, and increased perioperative risks associated with continued smoking. )        Anesthesia Quick Evaluation

## 2021-01-11 NOTE — Op Note (Signed)
01/11/2021  2:06 PM  PATIENT:  Megan [name]   MRN: [id number]  PRE-OPERATIVE DIAGNOSIS:  Osteoarthritis left hip   POST-OPERATIVE DIAGNOSIS: Same  Procedure: Left Total Hip Replacement  Surgeon: Elyn Aquas. Harlow Mares, MD   Assist: Carlynn Spry, PA-C  Anesthesia: Spinal   EBL: 200 mL   Specimens: None   Drains: None   Components used: A size 4 Polarstem Smith [employer]hew, R3 size 52 mm shell, and a 36 mm +0 mm head    Description of the procedure in detail: After informed consent was obtained and the appropriate extremity marked in the pre-operative holding area, the patient was taken to the operating room and placed in the supine position on the fracture table. All pressure points were well padded and bilateral lower extremities were place in traction spars. The hip was prepped and draped in standard sterile fashion. A spinal anesthetic had been delivered by the anesthesia team. The skin and subcutaneous tissues were injected with a mixture of Marcaine with epinephrine for post-operative pain. A longitudinal incision approximately 10 cm in length was carried out from the anterior superior iliac spine to the greater trochanter. The tensor fascia was divided and blunt dissection was taken down to the level of the joint capsule. The lateral circumflex vessels were cauterized. Deep retractors were placed and a portion of the anterior capsule was excised. Using fluoroscopy the neck cut was planned and carried out with a sagittal saw. The head was passed from the field with use of a corkscrew and hip skid. Deep retractors were placed along the acetabulum and the degenerative labrum and large osteophytes were removed with a Rongeur. The cup was sequentially reamed to a size 52 mm. The wound was irrigated and using fluoroscopy the size 52 mm cup was impacted in to anatomic position. A single screw was placed followed by a threaded hole cover. The final liner was impacted in to position. Attention  was then turned to the proximal femur. The leg was placed in extension and external rotation. The canal was opened and sequentially broached to a size 4. The trial components were placed and the hip relocated. The components were found to be in good position using fluoroscopy. The hip was dislocated and the trial components removed. The final components were impacted in to position and the hip relocated. The final components were again check with fluoroscopy and found to be in good position. Hemostasis was achieved with electrocautery. The deep capsule was injected with Marcaine and epinephrine. The wound was irrigated with bacitracin laced normal saline and the tensor fascia closed with #2 Quill suture. The subcutaneous tissues were closed with 2-0 vicryl and staples for the skin. A sterile dressing was applied and an abduction pillow. Patient tolerated the procedure well and there were no apparent complication. Patient was taken to the recovery room in good condition.   Kurtis Bushman, MD

## 2021-01-11 NOTE — Evaluation (Signed)
Physical Therapy Evaluation Patient Details Name: Megan [name] MRN: [id number] DOB: 1955/05/07 Today's Date: 01/11/2021  History of Present Illness  Pt is a 65 yo F diagnosed with osteoarthritis of the left hip and is s/p elective L THA. PMH includes: HTN, GERD, alcohol abuse, DM, and asthma.   Clinical Impression  Upon entry to room patient found rolling back and forth in bed and moaning searching for a position of comfort and reported 8/10 L hip pain. Nursing notified and entered room and provided pain medication during session.  Pt agreed to attempt to get to recliner for potential relief. Pt put forth good effort with functional tasks and required only minimal assist with bed mobility tasks and no physical assist to come to standing or to take several small steps at the EOB.  Once in standing and after taking several steps pt stated she would rather return to bed.  Pt again was very restless once in supine and in significant distress secondary to L hip pain with attempts made to reposition pt for comfort, nursing aware of pt's pain level.  Pt was and Ind community ambulator at baseline and is expected to make good progress towards goals once L hip pain is more controlled but will follow patient closely for possible downgrade in discharge recommendations as appropriate. Pt will benefit from HHPT upon discharge to safely address deficits listed in patient problem list for decreased caregiver assistance and eventual return to PLOF.      Recommendations for follow up therapy are one component of a multi-disciplinary discharge planning process, led by the attending physician.  Recommendations may be updated based on patient status, additional functional criteria and insurance authorization.  Follow Up Recommendations Home health PT    Assistance Recommended at Discharge Intermittent Supervision/Assistance  Functional Status Assessment Patient has had a recent decline in their functional status  and demonstrates the ability to make significant improvements in function in a reasonable and predictable amount of time.  Equipment Recommendations  3in1 (PT);Other (comment) (Pt is requesting hospital bed secondary to 15 steps to 2nd floor bedroom)    Recommendations for Other Services       Precautions / Restrictions Precautions Precautions: Anterior Hip Precaution Booklet Issued: Yes (comment) Restrictions Weight Bearing Restrictions: Yes LLE Weight Bearing: Weight bearing as tolerated      Mobility  Bed Mobility Overal bed mobility: Needs Assistance Bed Mobility: Supine to Sit;Sit to Supine     Supine to sit: Modified independent (Device/Increase time) Sit to supine: Min assist   General bed mobility comments: Extra time and effort only during sup to sit and min A for LLE control during sit to sup    Transfers Overall transfer level: Needs assistance Equipment used: Rolling walker (2 wheels) Transfers: Sit to/from Stand Sit to Stand: Min guard           General transfer comment: Min verbal cues for sequencing with pt able to stand without physical assist with no instability noted    Ambulation/Gait Ambulation/Gait assistance: Min guard Gait Distance (Feet): 2 Feet Assistive device: Rolling walker (2 wheels) Gait Pattern/deviations: Step-to pattern;Decreased step length - right;Decreased step length - left;Antalgic Gait velocity: decreased   General Gait Details: Pt only able to take several very small side steps towards the [facility] before needing to return to sitting secondary to L hip pain  Stairs            Wheelchair Mobility    Modified Rankin (Stroke Patients Only)  Balance Overall balance assessment: Needs assistance   Sitting balance-Leahy Scale: Good     Standing balance support: Bilateral upper extremity supported;During functional activity Standing balance-Leahy Scale: Fair                               Pertinent  Vitals/Pain Pain Assessment: 0-10 Pain Score: 8  Pain Location: L hip Pain Descriptors / Indicators: Sore;Aching Pain Intervention(s): Repositioned;Monitored during session;Patient requesting pain meds-RN notified;RN gave pain meds during session;Ice applied    Home Living Family/patient expects to be discharged to:: Private residence Living Arrangements: Children;Other relatives Available Help at Discharge: Family;Available 24 hours/day Type of Home: House Home Access: Stairs to enter Entrance Stairs-Rails: None Entrance Stairs-Number of Steps: 1 Alternate Level Stairs-Number of Steps: 15 Home Layout: Two level;Bed/bath upstairs Home Equipment: Conservation officer, nature (2 wheels);Cane - single point      Prior Function Prior Level of Function : Independent/Modified Independent             Mobility Comments: Ind amb without an AD community distances, no fall history, Ind with ADLs, does not drive, uses Public relations account executive        Extremity/Trunk Assessment   Upper Extremity Assessment Upper Extremity Assessment: Overall WFL for tasks assessed    Lower Extremity Assessment Lower Extremity Assessment: Generalized weakness;LLE deficits/detail LLE Deficits / Details: BLE ankle strength, AROM, and sensation to light touch grossly intact LLE: Unable to fully assess due to pain LLE Sensation: WNL       Communication   Communication: No difficulties  Cognition Arousal/Alertness: Awake/alert Behavior During Therapy: WFL for tasks assessed/performed Overall Cognitive Status: Within Functional Limits for tasks assessed                                          General Comments      Exercises     Assessment/Plan    PT Assessment Patient needs continued PT services  PT Problem List Decreased strength;Decreased activity tolerance;Decreased balance;Decreased mobility;Decreased knowledge of use of DME;Pain       PT Treatment  Interventions DME instruction;Gait training;Stair training;Functional mobility training;Therapeutic activities;Therapeutic exercise;Balance training;Patient/family education    PT Goals (Current goals can be found in the Care Plan section)  Acute Rehab PT Goals Patient Stated Goal: "To walk right again" PT Goal Formulation: With patient Time For Goal Achievement: 01/24/21 Potential to Achieve Goals: Good    Frequency BID   Barriers to discharge        Co-evaluation               AM-PAC PT "6 Clicks" Mobility  Outcome Measure Help needed turning from your back to your side while in a flat bed without using bedrails?: A Little Help needed moving from lying on your back to sitting on the side of a flat bed without using bedrails?: A Little Help needed moving to and from a bed to a chair (including a wheelchair)?: A Little Help needed standing up from a chair using your arms (e.g., wheelchair or bedside chair)?: A Little Help needed to walk in hospital room?: A Lot Help needed climbing 3-5 steps with a railing? : A Lot 6 Click Score: 16    End of Session Equipment Utilized During Treatment: Gait belt Activity Tolerance: Patient limited by pain Patient  left: in bed;with call bell/phone within reach;with bed alarm set;with SCD's reapplied;with nursing/sitter in room;Other (comment) (Ice bag to left hip) Nurse Communication: Mobility status;Patient requests pain meds;Other (comment);Weight bearing status PT Visit Diagnosis: Other abnormalities of gait and mobility (R26.89);Muscle weakness (generalized) (M62.81);Pain Pain - Right/Left: Left Pain - part of body: Hip    Time: 9692-4932 PT Time Calculation (min) (ACUTE ONLY): 19 min   Charges:   PT Evaluation $PT Eval Moderate Complexity: 1 Mod          D. Scott Coley Littles PT, DPT 01/11/21, 5:25 PM

## 2021-01-11 NOTE — Anesthesia Postprocedure Evaluation (Signed)
Anesthesia Post Note  Patient: Megan [name]  Procedure(s) Performed: TOTAL HIP ARTHROPLASTY ANTERIOR APPROACH (Left: Hip)  Patient location during evaluation: PACU Anesthesia Type: Spinal Level of consciousness: oriented and awake and alert Pain management: pain level controlled Vital Signs Assessment: post-procedure vital signs reviewed and stable Respiratory status: spontaneous breathing, respiratory function stable and patient connected to nasal cannula oxygen Cardiovascular status: blood pressure returned to baseline and stable Postop Assessment: no headache, no backache, no apparent nausea or vomiting and spinal receding Anesthetic complications: no   No notable events documented.   Last Vitals:  Vitals:   01/11/21 1445 01/11/21 1500  BP: 108/67 106/68  Pulse: 93 85  Resp: 17 19  Temp:    SpO2: 97% 99%    Last Pain:  Vitals:   01/11/21 1445  TempSrc:   PainSc: 0-No pain                 Arita Miss

## 2021-01-11 NOTE — H&P (Signed)
The patient has been re-examined, and the chart reviewed, and there have been no interval changes to the documented history and physical.  Plan a left total hip today.  Anesthesia is not consulted regarding a peripheral nerve block for post-operative pain.  The risks, benefits, and alternatives have been discussed at length, and the patient is willing to proceed.    

## 2021-01-11 NOTE — Transfer of Care (Signed)
Immediate Anesthesia Transfer of Care Note  Patient: Megan [name]  Procedure(s) Performed: TOTAL HIP ARTHROPLASTY ANTERIOR APPROACH (Left: Hip)  Patient Location: PACU  Anesthesia Type:Spinal  Level of Consciousness: awake, alert  and oriented  Airway & Oxygen Therapy: Patient Spontanous Breathing  Post-op Assessment: Report given to RN and Post -op Vital signs reviewed and stable  Post vital signs: Reviewed  Last Vitals:  Vitals Value Taken Time  BP    Temp    Pulse 98 01/11/21 1411  Resp 19 01/11/21 1411  SpO2 97 % 01/11/21 1411  Vitals shown include unvalidated device data.  Last Pain:  Vitals:   01/11/21 1042  TempSrc: Temporal  PainSc: 0-No pain         Complications: No notable events documented.

## 2021-01-11 NOTE — Anesthesia Procedure Notes (Signed)
Spinal  Patient location during procedure: OR Reason for block: surgical anesthesia Staffing Performed: resident/CRNA  Anesthesiologist: Zak, Arthur, MD Resident/CRNA: Mahealani Sulak, CRNA Preanesthetic Checklist Completed: patient identified, IV checked, site marked, risks and benefits discussed, surgical consent, monitors and equipment checked, pre-op evaluation and timeout performed Spinal Block Patient position: sitting Prep: ChloraPrep and site prepped and draped Patient monitoring: heart rate, continuous pulse ox, blood pressure and cardiac monitor Approach: midline Location: L4-5 Injection technique: single-shot Needle Needle type: Whitacre and Introducer  Needle gauge: 24 G Needle length: 9 cm Assessment Events: CSF return Additional Notes Negative paresthesia. Negative blood return. Positive free-flowing CSF. Expiration date of kit checked and confirmed. Patient tolerated procedure well, without complications.      

## 2021-01-12 LAB — CBC
HCT: 30 % — ABNORMAL LOW (ref 36.0–46.0)
Hemoglobin: 10.1 g/dL — ABNORMAL LOW (ref 12.0–15.0)
MCH: 30.6 pg (ref 26.0–34.0)
MCHC: 33.7 g/dL (ref 30.0–36.0)
MCV: 90.9 fL (ref 80.0–100.0)
Platelets: 214 10*3/uL (ref 150–400)
RBC: 3.3 MIL/uL — ABNORMAL LOW (ref 3.87–5.11)
RDW: 15.1 % (ref 11.5–15.5)
WBC: 10.7 10*3/uL — ABNORMAL HIGH (ref 4.0–10.5)
nRBC: 0 % (ref 0.0–0.2)

## 2021-01-12 LAB — BASIC METABOLIC PANEL
Anion gap: 7 (ref 5–15)
BUN: 18 mg/dL (ref 8–23)
CO2: 24 mmol/L (ref 22–32)
Calcium: 10.9 mg/dL — ABNORMAL HIGH (ref 8.9–10.3)
Chloride: 103 mmol/L (ref 98–111)
Creatinine, Ser: 0.76 mg/dL (ref 0.44–1.00)
GFR, Estimated: >60 mL/min (ref >60)
Glucose, Bld: 191 mg/dL — ABNORMAL HIGH (ref 70–99)
Potassium: 3.9 mmol/L (ref 3.5–5.1)
Sodium: 134 mmol/L — ABNORMAL LOW (ref 135–145)

## 2021-01-12 MED ORDER — METHOCARBAMOL 500 MG PO TABS: 500.0000 mg | ORAL_TABLET | Freq: Four times a day (QID) | ORAL | 0 refills | Status: DC

## 2021-01-12 MED ORDER — HYDROCODONE-ACETAMINOPHEN 7.5-325 MG PO TABS: 1.0000 | ORAL_TABLET | ORAL | 0 refills | Status: DC | PRN

## 2021-01-12 MED ORDER — ASPIRIN 81 MG PO CHEW: 81.0000 mg | CHEWABLE_TABLET | Freq: Two times a day (BID) | ORAL | 0 refills | Status: DC

## 2021-01-12 NOTE — Progress Notes (Signed)
Physical Therapy Treatment Patient Details Name: Megan [name] MRN: [id number] DOB: Jan 06, 1956 Today's Date: 01/12/2021   History of Present Illness Pt is a 65 yo F diagnosed with osteoarthritis of the left hip and is s/p elective L THA. PMH includes: HTN, GERD, alcohol abuse, DM, and asthma.    PT Comments    Pt received sitting EOB finishing OT session upon arriving. She continues to be cooperative and motivated. Easily able to stand to RW with supervision. Ambulated to rehab gym without safety concern. Performed stairs without assistance with sideways technique. No safety concerns with stair performance. Pt will benefit from HHPT at DC to continue to progress her to PLOF.   Recommendations for follow up therapy are one component of a multi-disciplinary discharge planning process, led by the attending physician.  Recommendations may be updated based on patient status, additional functional criteria and insurance authorization.  Follow Up Recommendations  Home health PT     Assistance Recommended at Discharge Intermittent Supervision/Assistance  Equipment Recommendations  3in1 (PT);Hospital bed;Other (comment) (Pt requesting hospital bed 2/2 to bedroom on 2nd floor of home.)       Precautions / Restrictions Precautions Precautions: Anterior Hip Precaution Booklet Issued: No Restrictions Weight Bearing Restrictions: Yes LLE Weight Bearing: Weight bearing as tolerated     Mobility  Bed Mobility               General bed mobility comments: Pt recieved sitting EOB finishing OT session.Did require min A to return to bed post session    Transfers Overall transfer level: Needs assistance Equipment used: Rolling walker (2 wheels) Transfers: Sit to/from Stand Sit to Stand: Supervision           General transfer comment: pt was able to improve STS to supervision.Demonstrates safe technique with minimal cueing    Ambulation/Gait Ambulation/Gait assistance:  Supervision Gait Distance (Feet): 100 Feet Assistive device: Rolling walker (2 wheels) Gait Pattern/deviations: Step-through pattern;Decreased stride length;Decreased step length - right;Decreased step length - left;Decreased stance time - right;Decreased stance time - left Gait velocity: decreased   General Gait Details: pt demonstrated imporve gait abilities. Ambulated to rehab gym and perform stairs prior to ambulation short distance in hall. no LOB or safety concern.   Stairs Stairs: Yes Stairs assistance: Supervision Stair Management: One rail Left;Sideways;Step to pattern Number of Stairs: 4 General stair comments: pt demonstrated safe ability to ascend/descend 4 stair. Did not want to trial up/down stairs again to simulate home # of stairs.      Balance Overall balance assessment: Needs assistance Sitting-balance support: Feet supported;No upper extremity supported Sitting balance-Leahy Scale: Good Sitting balance - Comments: Steady static/dynamic sitting at EOB.   Standing balance support: During functional activity;Bilateral upper extremity supported Standing balance-Leahy Scale: Good Standing balance comment: no balance concerns with use of RW for dynamic standing task                            Cognition Arousal/Alertness: Awake/alert Behavior During Therapy: WFL for tasks assessed/performed Overall Cognitive Status: Within Functional Limits for tasks assessed                                 General Comments: Pt is A and O x 4               Pertinent Vitals/Pain Pain Assessment: 0-10 Pain Score: 2  Pain Location: L  hip (R hip will need future intervention per pt) Pain Descriptors / Indicators: Sore;Aching;Grimacing;Guarding Pain Intervention(s): Limited activity within patient's tolerance;Monitored during session;Premedicated before session;Repositioned     PT Goals (current goals can now be found in the care plan section) Acute  Rehab PT Goals Patient Stated Goal: get better and go home Progress towards PT goals: Progressing toward goals    Frequency    BID      PT Plan Current plan remains appropriate    Co-evaluation              AM-PAC PT "6 Clicks" Mobility   Outcome Measure  Help needed turning from your back to your side while in a flat bed without using bedrails?: A Little Help needed moving from lying on your back to sitting on the side of a flat bed without using bedrails?: A Little Help needed moving to and from a bed to a chair (including a wheelchair)?: A Little Help needed standing up from a chair using your arms (e.g., wheelchair or bedside chair)?: A Little Help needed to walk in hospital room?: A Little Help needed climbing 3-5 steps with a railing? : A Little 6 Click Score: 18    End of Session   Activity Tolerance: Patient tolerated treatment well Patient left: in bed;with call bell/phone within reach;with bed alarm set Nurse Communication: Mobility status PT Visit Diagnosis: Other abnormalities of gait and mobility (R26.89);Muscle weakness (generalized) (M62.81);Pain Pain - Right/Left: Left Pain - part of body: Hip     Time: 1325-1345 PT Time Calculation (min) (ACUTE ONLY): 20 min  Charges:  $Gait Training: 8-22 mins                     Julaine Fusi PTA 01/12/21, 6:08 PM

## 2021-01-12 NOTE — Progress Notes (Signed)
Subjective:  Patient reports pain as mild to moderate.    Objective:   VITALS:   Vitals:   01/11/21 1531 01/11/21 1611 01/11/21 2012 01/12/21 0446  BP:  135/82 (!) 147/80 (!) 146/82  Pulse: 80 80 92 90  Resp: (!) '24 18 20 16  ' Temp:  98.7 F (37.1 C) 98.9 F (37.2 C) 97.7 F (36.5 C)  TempSrc:   Oral   SpO2: 98% 98% 100% 100%  Weight:      Height:        PHYSICAL EXAM:  Neurologically intact ABD soft Neurovascular intact Sensation intact distally Intact pulses distally Dorsiflexion/Plantar flexion intact Incision: scant drainage and drsg changed No cellulitis present Compartment soft  LABS  Results for orders placed or performed during the hospital encounter of 01/11/21 (from the past 24 hour(s))  Type and screen [place]     Status: None   Collection Time: 01/11/21 11:17 AM  Result Value Ref Range   ABO/RH(D) O POS    Antibody Screen NEG    Sample Expiration      01/14/2021,2359 Performed at [facility], [address] [place], [place] [address]   Glucose, capillary     Status: Abnormal   Collection Time: 01/11/21 11:19 AM  Result Value Ref Range   Glucose-Capillary 160 (H) 70 - 99 mg/dL  Glucose, capillary     Status: Abnormal   Collection Time: 01/11/21  2:18 PM  Result Value Ref Range   Glucose-Capillary 141 (H) 70 - 99 mg/dL  CBC     Status: Abnormal   Collection Time: 01/12/21  4:25 AM  Result Value Ref Range   WBC 10.7 (H) 4.0 - 10.5 K/uL   RBC 3.30 (L) 3.87 - 5.11 MIL/uL   Hemoglobin 10.1 (L) 12.0 - 15.0 g/dL   HCT 30.0 (L) 36.0 - 46.0 %   MCV 90.9 80.0 - 100.0 fL   MCH 30.6 26.0 - 34.0 pg   MCHC 33.7 30.0 - 36.0 g/dL   RDW 15.1 11.5 - 15.5 %   Platelets 214 150 - 400 K/uL   nRBC 0.0 0.0 - 0.2 %  Basic metabolic panel     Status: Abnormal   Collection Time: 01/12/21  4:25 AM  Result Value Ref Range   Sodium 134 (L) 135 - 145 mmol/L   Potassium 3.9 3.5 - 5.1 mmol/L   Chloride 103 98 - 111 mmol/L   CO2  24 22 - 32 mmol/L   Glucose, Bld 191 (H) 70 - 99 mg/dL   BUN 18 8 - 23 mg/dL   Creatinine, Ser 0.76 0.44 - 1.00 mg/dL   Calcium 10.9 (H) 8.9 - 10.3 mg/dL   GFR, Estimated >60 >60 mL/min   Anion gap 7 5 - 15    DG HIP OPERATIVE UNILAT W OR W/O PELVIS LEFT  Result Date: 01/11/2021 CLINICAL DATA:  Elective surgery. EXAM: OPERATIVE LEFT HIP (WITH PELVIS IF PERFORMED) TECHNIQUE: Fluoroscopic spot image(s) were submitted for interpretation post-operatively. COMPARISON:  None. FINDINGS: Two fluoroscopic spot views of the left hip obtained in the operating room. Left hip arthroplasty in expected alignment. Fluoroscopy time 12 seconds. IMPRESSION: Procedural fluoroscopy for left hip arthroplasty. Electronically Signed   By: Keith Rake M.D.   On: 01/11/2021 15:13    Assessment/Plan: 1 Day Post-Op   Active Problems:   History of total hip replacement, left   Advance diet Up with therapy Discharge likely 01/13/21 once PT goals with stairs met. Will need help  with discharge transportation Follow up in office 2 weeks, Call office to confirm appointment   Carlynn Spry , PA-C 01/12/2021, 6:34 AM

## 2021-01-12 NOTE — Evaluation (Signed)
Occupational Therapy Evaluation Patient Details Name: Megan [name] MRN: [id number] DOB: 1955/08/23 Today's Date: 01/12/2021   History of Present Illness Pt is a 65 yo F diagnosed with osteoarthritis of the left hip and is s/p elective L THA. PMH includes: HTN, GERD, alcohol abuse, DM, and asthma.   Clinical Impression   Megan [name] was seen for OT evaluation this date, POD#1 from above surgery. Pt was independent in all ADLs prior to surgery, however occasionally using SPC for mobility due to L hip pain. Pt is eager to return to PLOF with less pain and improved safety and independence. Pt currently requires minimal assist for LB dressing and bathing while in seated position due to pain and limited AROM of L hip. Pt instructed in self care skills, falls prevention strategies, home/routines modifications, DME/AE for LB bathing and dressing tasks, and compression stocking mgt strategies. Pt would benefit from additional instruction in self care skills and techniques to help maintain precautions with or without assistive devices to support recall and carryover prior to discharge. Recommend HHOT upon discharge.       Recommendations for follow up therapy are one component of a multi-disciplinary discharge planning process, led by the attending physician.  Recommendations may be updated based on patient status, additional functional criteria and insurance authorization.   Follow Up Recommendations  Home health OT    Assistance Recommended at Discharge PRN  Functional Status Assessment  Patient has had a recent decline in their functional status and demonstrates the ability to make significant improvements in function in a reasonable and predictable amount of time.  Equipment Recommendations  BSC    Recommendations for Other Services       Precautions / Restrictions Precautions Precautions: Anterior Hip Precaution Booklet Issued: No Restrictions Weight Bearing Restrictions: Yes LLE  Weight Bearing: Weight bearing as tolerated      Mobility Bed Mobility Overal bed mobility: Needs Assistance Bed Mobility: Supine to Sit     Supine to sit: Min assist     General bed mobility comments: Min A to advance LLE over EOB    Transfers Overall transfer level: Needs assistance Equipment used: Rolling walker (2 wheels) Transfers: Sit to/from Stand Sit to Stand: Supervision           General transfer comment: STS from bed in lowest position with supervision.      Balance Overall balance assessment: Needs assistance Sitting-balance support: Feet supported;No upper extremity supported Sitting balance-Leahy Scale: Good Sitting balance - Comments: Steady static/dynamic sitting at EOB.   Standing balance support: During functional activity;Bilateral upper extremity supported Standing balance-Leahy Scale: Good Standing balance comment: no balance concerns with use of RW for dynamic standing task                           ADL either performed or assessed with clinical judgement   ADL Overall ADL's : Needs assistance/impaired                                       General ADL Comments: Pt is functionally limited by increased pain and decreased AROM of her LLE. She requires MIN A for bed mobility, Close supervision for LB dressing task using sock aid (Anticipate MIN A for more extensive LB ADL management including donning pants, compression stockings, etc.), and supervision for functional mobility.     Vision Baseline Vision/History:  1 Wears glasses Ability to See in Adequate Light: 1 Impaired Patient Visual Report: No change from baseline       Perception     Praxis      Pertinent Vitals/Pain Pain Assessment: 0-10 Pain Score: 5  Pain Location: L hip Pain Descriptors / Indicators: Sore;Aching;Grimacing;Guarding Pain Intervention(s): Patient requesting pain meds-RN notified;RN gave pain meds during session;Limited activity within  patient's tolerance;Monitored during session;Repositioned;Utilized relaxation techniques     Hand Dominance Right   Extremity/Trunk Assessment Upper Extremity Assessment Upper Extremity Assessment: Overall WFL for tasks assessed   Lower Extremity Assessment Lower Extremity Assessment: Defer to PT evaluation;LLE deficits/detail LLE Deficits / Details: s/p L TKA (Anterior) WBAT   Cervical / Trunk Assessment Cervical / Trunk Assessment: Normal   Communication Communication Communication: No difficulties   Cognition Arousal/Alertness: Awake/alert Behavior During Therapy: WFL for tasks assessed/performed Overall Cognitive Status: Within Functional Limits for tasks assessed                                 General Comments: Pt is A and O x 4     General Comments       Exercises Other Exercises Other Exercises: Pt educated on role of OT in acute vs. HH setting, falls prevention strategies, safe use of AE/DME for ADL management, complementary alternative methods for pain mangement including distraction techniques and deep breathing, pet management strategies, and routines modifications to support safety and functional independence upon DC. OT facilitates LB dressing task during session.   Shoulder Instructions      Home Living Family/patient expects to be discharged to:: Private residence Living Arrangements: Children;Other relatives Available Help at Discharge: Family;Available 24 hours/day (Daughter works from home.) Type of Home: House Home Access: Stairs to enter Technical brewer of Steps: 1 Entrance Stairs-Rails: None Home Layout: Two level;Bed/bath upstairs Alternate Level Stairs-Number of Steps: 15 Alternate Level Stairs-Rails: Left Bathroom Shower/Tub: Teacher, early years/pre: Standard     Home Equipment: Conservation officer, nature (2 wheels);Cane - single point          Prior Functioning/Environment Prior Level of Function :  Independent/Modified Independent             Mobility Comments: Ind amb without an AD community distances, no fall history, Ind with ADLs, does not drive, uses community transportation services. Family assist with IADL management including shopping, etc.          OT Problem List: Decreased strength;Decreased coordination;Pain;Decreased activity tolerance;Decreased safety awareness;Impaired balance (sitting and/or standing);Decreased knowledge of use of DME or AE      OT Treatment/Interventions: Self-care/ADL training;Therapeutic exercise;Therapeutic activities;DME and/or AE instruction;Patient/family education;Energy conservation;Balance training    OT Goals(Current goals can be found in the care plan section) Acute Rehab OT Goals Patient Stated Goal: To have less pain OT Goal Formulation: With patient Time For Goal Achievement: 01/26/21 Potential to Achieve Goals: Good ADL Goals Pt Will Perform Lower Body Dressing: sit to/from stand;with modified independence;with adaptive equipment Pt Will Transfer to Toilet: bedside commode;with modified independence;ambulating (c LRAD) Pt Will Perform Toileting - Clothing Manipulation and hygiene: with adaptive equipment;with modified independence;sit to/from stand  OT Frequency: Min 2X/week   Barriers to D/C:            Co-evaluation              AM-PAC OT "6 Clicks" Daily Activity     Outcome Measure Help from another person eating meals?:  None Help from another person taking care of personal grooming?: None Help from another person toileting, which includes using toliet, bedpan, or urinal?: A Little Help from another person bathing (including washing, rinsing, drying)?: A Little Help from another person to put on and taking off regular upper body clothing?: None Help from another person to put on and taking off regular lower body clothing?: A Little 6 Click Score: 21   End of Session Equipment Utilized During Treatment:  Rolling walker (2 wheels) ([place]; Sock Aid) Nurse Communication: Patient requests pain meds  Activity Tolerance: Patient tolerated treatment well Patient left: Other (comment) (In therapy gym with PT preparing for stairs.)  OT Visit Diagnosis: Other abnormalities of gait and mobility (R26.89);Pain Pain - Right/Left: Left Pain - part of body: Hip                Time: 2883-3744 OT Time Calculation (min): 34 min Charges:  OT General Charges $OT Visit: 1 Visit OT Evaluation $OT Eval Low Complexity: 1 Low OT Treatments $Self Care/Home Management : 23-37 mins  Shara Blazing, M.S., OTR/L Ascom: [phone] 01/12/21, 1:49 PM

## 2021-01-12 NOTE — Discharge Instructions (Signed)

## 2021-01-12 NOTE — Progress Notes (Signed)
Physical Therapy Treatment Patient Details Name: Megan [name] MRN: [id number] DOB: 06-24-1955 Today's Date: 01/12/2021   History of Present Illness Pt is a 66 yo F diagnosed with osteoarthritis of the left hip and is s/p elective L THA. PMH includes: HTN, GERD, alcohol abuse, DM, and asthma.    PT Comments    Pt was long sitting in bed upon arriving. Reports poor nights sleep however overall feeling much better than previous date. Reports no pain at rest that only elevated to 3/10 in wt bearing. Pt required min assist to exit bed. CGA for safety to stand and ambulate 50 ft +RW without LOB or safety concern. Breakfast tray arrived and author assisted with set up. Will return later this date to increase ambulation and perform stairs. Pt's bedroom is on 2nd floor of home and pt is requesting hospital bed at DC. She also would benefit form BSC but does have personal RW already. Recommend DC home with HHPT to follow. She was sitting in recliner post session with call bell in reach and RN tech at bedside.    Recommendations for follow up therapy are one component of a multi-disciplinary discharge planning process, led by the attending physician.  Recommendations may be updated based on patient status, additional functional criteria and insurance authorization.  Follow Up Recommendations  Home health PT     Assistance Recommended at Discharge Intermittent Supervision/Assistance  Equipment Recommendations  3in1 (PT);Hospital bed;Other (comment) (pt requesting hospital bed at DC so she does not have to go to 2nd story of her home.)       Precautions / Restrictions Precautions Precautions: Anterior Hip Precaution Booklet Issued: Yes (comment) Restrictions Weight Bearing Restrictions: Yes LLE Weight Bearing: Weight bearing as tolerated     Mobility  Bed Mobility Overal bed mobility: Needs Assistance Bed Mobility: Supine to Sit;Sit to Supine     Supine to sit: Modified independent  (Device/Increase time) Sit to supine: Min assist   General bed mobility comments: min assist required to exit bed with increased time and vcs for technique improvements    Transfers Overall transfer level: Needs assistance Equipment used: Rolling walker (2 wheels) Transfers: Sit to/from Stand Sit to Stand: Min guard           General transfer comment: Pt was able to STS from slightly elevated bed height with CGA only. no physical lifting assistance however pt does have poor fwd wt shift to achieve standing. Once standing pt balance was good. BUE support on RW throughout standing activity    Ambulation/Gait Ambulation/Gait assistance: Supervision Gait Distance (Feet): 50 Feet Assistive device: Rolling walker (2 wheels) Gait Pattern/deviations: Step-through pattern;Decreased stride length;Decreased step length - right;Decreased step length - left;Decreased stance time - right;Decreased stance time - left Gait velocity: decreased   General Gait Details: Pt had more pain reported in non operative hip then in LLE.      Balance Overall balance assessment: Needs assistance Sitting-balance support: Bilateral upper extremity supported;Feet supported Sitting balance-Leahy Scale: Good     Standing balance support: Bilateral upper extremity supported;During functional activity Standing balance-Leahy Scale: Good Standing balance comment: no balance concerns with use of RW for dynamic standing task       Cognition Arousal/Alertness: Awake/alert Behavior During Therapy: WFL for tasks assessed/performed Overall Cognitive Status: Within Functional Limits for tasks assessed        General Comments: Pt is A and O x 4  Pertinent Vitals/Pain Pain Assessment: 0-10 Pain Score: 3  Pain Location: L hip Pain Descriptors / Indicators: Sore;Aching Pain Intervention(s): Limited activity within patient's tolerance;Monitored during session;Premedicated before  session;Repositioned;Ice applied     PT Goals (current goals can now be found in the care plan section) Acute Rehab PT Goals Patient Stated Goal: get better and go home Progress towards PT goals: Progressing toward goals    Frequency    BID      PT Plan Current plan remains appropriate       AM-PAC PT "6 Clicks" Mobility   Outcome Measure  Help needed turning from your back to your side while in a flat bed without using bedrails?: A Little Help needed moving from lying on your back to sitting on the side of a flat bed without using bedrails?: A Little Help needed moving to and from a bed to a chair (including a wheelchair)?: A Little Help needed standing up from a chair using your arms (e.g., wheelchair or bedside chair)?: A Little Help needed to walk in hospital room?: A Little Help needed climbing 3-5 steps with a railing? : A Little 6 Click Score: 18    End of Session Equipment Utilized During Treatment: Gait belt Activity Tolerance: Patient tolerated treatment well Patient left: in chair;with call bell/phone within reach;with chair alarm set Nurse Communication: Mobility status PT Visit Diagnosis: Other abnormalities of gait and mobility (R26.89);Muscle weakness (generalized) (M62.81);Pain Pain - Right/Left: Left Pain - part of body: Hip     Time: 7858-8502 PT Time Calculation (min) (ACUTE ONLY): 23 min  Charges:  $Gait Training: 8-22 mins $Therapeutic Activity: 8-22 mins                    Julaine Fusi PTA 01/12/21, 8:39 AM

## 2021-01-12 NOTE — TOC Initial Note (Addendum)
Transition of Care [facility]) - Initial/Assessment Note    Patient Details  Name: Megan [name] MRN: [id number] Date of Birth: [birth year]  Transition of Care [facility]) CM/SW Contact:    Izola Price, RN Phone Number: 01/12/2021, 10:05 AM  Clinical Narrative: 10/29:  Damaris Schooner with patient regarding TOC assessment and discharge plans.   Patient is agreeable to [facility] services.  Requests hospital bed.  Patient does not drive but uses Psychologist, forensic services.  PCP and Pharmacy confirmed as in chart/current. Patient lives with daughter, who works at [employer], grandson, and Son in-law.  Patient has transportation concerns in getting home as all family members work on the weekend, daughter works from home during the week and at [employer] on weekends.  States she does not know if anyone is able to transport her tomorrow.  She has a older 2-Wheel RW and cane at home.  She has 15 steps to her bedroom at the home and requested a hospital bed until able to safely manage stairs.  Has had prior [facility] but cannot remember which one.  Verbal permission given to share pertinent information for discharge planning with [facility] agencies/DME agency and with daughter.  Confirmed home address and contact information.      Contacted [employer] for DME Requested hospital bed order if appropriate from provider.  Patient wants ALL DME delivered to home address.  Grandson available to receive DME today or tomorrow.  Daughter able to receive patient at home if alternate transportation is arranged but not over the weekend.    Will follow up with patient after [facility] agency secured.  Simmie Davies RN CM [phone]         230 pm:  Per Floydene Flock at [facility], patient was set up with [facility] via [facility] 01/04/21.   Expected Discharge Plan: [place]     Patient Goals and CMS Choice        Expected Discharge Plan and Services Expected Discharge Plan: [place]    Discharge Planning Services: CM Consult Post Acute Care Choice: Durable Medical Equipment, Home Health Living arrangements for the past 2 months: [place] ([place] with daughter/son-in-law and grandson)                 DME Arranged: 3-N-1, Walker rolling, Hospital bed DME Agency: [facility] Date DME Agency Contacted: 01/12/21 Time DME Agency Contacted: 27 Representative spoke with at DME Agency: [place] Arrangements/Services Living arrangements for the past 2 months: [place] ([place] with daughter/son-in-law and grandson) Lives with:: Adult Children Patient language and need for interpreter reviewed:: No        Need for Family Participation in Patient Care: Yes (Comment) Care giver support system in place?:  (All family members living in [place] work outside the home or are in school during the day.)   Criminal Activity/Legal Involvement Pertinent to Current Situation/Hospitalization: No - Comment as needed  Activities of Daily Living Home Assistive Devices/Equipment: Eyeglasses, Radio producer (specify quad or straight) (quad) ADL Screening (condition at time of admission) Patient's cognitive ability adequate to safely complete daily activities?: Yes Is the patient deaf or have difficulty hearing?: No Does the patient have difficulty seeing, even when wearing glasses/contacts?: No Does the patient have difficulty concentrating, remembering, or making decisions?: No Patient able to express need for assistance with ADLs?: Yes Does the patient have difficulty dressing or bathing?:  No Independently performs ADLs?: Yes (appropriate for developmental age) Does the patient have difficulty walking or climbing stairs?: Yes Weakness of Legs: Both Weakness of Arms/Hands: None  Permission Sought/Granted Permission sought to share information with : Case Manager, Customer service manager, Other (comment), Family [employer]orts (DME/HH  agency) Permission granted to share information with : Yes, Verbal Permission Granted  Share Information with NAME: NIcole Bender  Permission granted to share info w AGENCY: [employer] for DME and [employer] agencies for ins approval  Permission granted to share info w Relationship: Daughter  Permission granted to share info w Contact Information: [phone] (Home Phone)  Emotional Assessment Appearance:: Appears stated age Attitude/Demeanor/Rapport: Engaged Affect (typically observed): Pleasant Orientation: : Oriented to Self, Oriented to Place, Oriented to  Time, Oriented to Situation Alcohol / Substance Use: Not Applicable Psych Involvement: No (comment)  Admission diagnosis:  History of total hip replacement, left [P50.932] Patient Active Problem List   Diagnosis Date Noted   History of total hip replacement, left 01/11/2021   Left breast abscess 12/05/2020   Tobacco use 12/05/2020   Fatty liver disease, nonalcoholic 67/02/4579   Arthritis of left hip 09/20/2020   Assistance needed with transportation 09/20/2020   Hepatomegaly 05/17/2020   Alcohol abuse 01/19/2018   Hyperparathyroidism ([place]) 01/19/2018   Mixed hyperlipidemia 01/19/2018   Class 1 obesity with body mass index (BMI) of 33.0 to 33.9 in adult 05/27/2017   Essential hypertension 05/27/2017   Gastroesophageal reflux disease without esophagitis 05/27/2017   Mucopurulent chronic bronchitis ([place]) 05/27/2017   Type 2 diabetes mellitus with other specified complication ([place]) 99/83/3825   PCP:  Lesleigh Noe, MD Pharmacy:   [place], [place] [address] [place] [place] [address] Phone: [phone] Fax: [phone]     Social Determinants of Health (SDOH) Interventions    Readmission Risk Interventions No flowsheet data found.

## 2021-01-13 LAB — CBC
HCT: 29.8 % — ABNORMAL LOW (ref 36.0–46.0)
Hemoglobin: 10.2 g/dL — ABNORMAL LOW (ref 12.0–15.0)
MCH: 30.9 pg (ref 26.0–34.0)
MCHC: 34.2 g/dL (ref 30.0–36.0)
MCV: 90.3 fL (ref 80.0–100.0)
Platelets: 205 10*3/uL (ref 150–400)
RBC: 3.3 MIL/uL — ABNORMAL LOW (ref 3.87–5.11)
RDW: 15.3 % (ref 11.5–15.5)
WBC: 13.5 10*3/uL — ABNORMAL HIGH (ref 4.0–10.5)
nRBC: 0 % (ref 0.0–0.2)

## 2021-01-13 NOTE — Progress Notes (Signed)
Physical Therapy Treatment Patient Details Name: Megan [name] MRN: [id number] DOB: 04-22-1955 Today's Date: 01/13/2021   History of Present Illness Pt is a 65 yo F diagnosed with osteoarthritis of the left hip and is s/p elective L THA. PMH includes: HTN, GERD, alcohol abuse, DM, and asthma.    PT Comments      Recommendations for follow up therapy are one component of a multi-disciplinary discharge planning process, led by the attending physician.  Recommendations may be updated based on patient status, additional functional criteria and insurance authorization.  Follow Up Recommendations  Other (comment)     Assistance Recommended at Discharge Intermittent Supervision/Assistance  Equipment Recommendations  3in1 (PT);Rolling walker (2 wheels)    Recommendations for Other Services  Pt ready for session no pain at rest but increased to 6/10 with gait.  RN in to give pain meds during session.  OOB with min a x 1 for LE assist.  She is able to stand and walk to bathroom then continue to gym for stair training and back to room.  Refuses to remain OOB in recliner and opts to return to bed with mod a x 1 for LE assist.  Pt voices concern over her ability to return home.  Lives with daughter who works from home for a call center and stated she is unsure if she can help her in/oob.  Stated she sits EOB at home and does not sit in a recliner but occasionally a bar stool if in the kitchen.  Pt encouraged to talk with daughter about her ability to assist her during the day as she continues to need help in and OOB.  Pt stated she would need a nurse 24 hours a day at home.  Pt encouraged to have a frank discussion with daughter on her needs.        Precautions / Restrictions Precautions Precautions: Anterior Hip Precaution Booklet Issued: No Restrictions Weight Bearing Restrictions: Yes LLE Weight Bearing: Weight bearing as tolerated     Mobility  Bed Mobility Overal bed mobility:  Needs Assistance Bed Mobility: Supine to Sit;Sit to Supine     Supine to sit: Min assist Sit to supine: Mod assist;Min assist        Transfers Overall transfer level: Needs assistance Equipment used: Rolling walker (2 wheels) Transfers: Sit to/from Stand Sit to Stand: Supervision;Min guard                Ambulation/Gait Ambulation/Gait assistance: Min guard Gait Distance (Feet): 100 Feet Assistive device: Rolling walker (2 wheels) Gait Pattern/deviations: Step-to pattern Gait velocity: decreased       Stairs Stairs: Yes Stairs assistance: Min guard Stair Management: One rail Left;Step to pattern;Backwards;Forwards Number of Stairs: 4 General stair comments: up forward, down back/sideways   Wheelchair Mobility    Modified Rankin (Stroke Patients Only)       Balance Overall balance assessment: Needs assistance Sitting-balance support: Feet supported;No upper extremity supported Sitting balance-Leahy Scale: Good     Standing balance support: During functional activity;Bilateral upper extremity supported Standing balance-Leahy Scale: Good                              Cognition Arousal/Alertness: Awake/alert Behavior During Therapy: WFL for tasks assessed/performed Overall Cognitive Status: Within Functional Limits for tasks assessed  Exercises Other Exercises Other Exercises: to commode to void and small formed BM    General Comments        Pertinent Vitals/Pain Pain Assessment: Faces Faces Pain Scale: Hurts even more Pain Location: 0 at rest, 6/10 after gait Pain Descriptors / Indicators: Sore;Aching;Grimacing;Guarding Pain Intervention(s): RN gave pain meds during session;Limited activity within patient's tolerance;Repositioned;Monitored during session    Home Living                          Prior Function            PT Goals (current goals can now be  found in the care plan section) Progress towards PT goals: Progressing toward goals    Frequency    BID      PT Plan Current plan remains appropriate;Other (comment)    Co-evaluation              AM-PAC PT "6 Clicks" Mobility   Outcome Measure  Help needed turning from your back to your side while in a flat bed without using bedrails?: A Little Help needed moving from lying on your back to sitting on the side of a flat bed without using bedrails?: A Little Help needed moving to and from a bed to a chair (including a wheelchair)?: A Little Help needed standing up from a chair using your arms (e.g., wheelchair or bedside chair)?: A Little Help needed to walk in hospital room?: A Little Help needed climbing 3-5 steps with a railing? : A Little 6 Click Score: 18    End of Session Equipment Utilized During Treatment: Gait belt Activity Tolerance: Patient tolerated treatment well Patient left: in bed;with call bell/phone within reach;with bed alarm set Nurse Communication: Mobility status PT Visit Diagnosis: Other abnormalities of gait and mobility (R26.89);Muscle weakness (generalized) (M62.81);Pain Pain - Right/Left: Left Pain - part of body: Hip     Time: 1700-1749 PT Time Calculation (min) (ACUTE ONLY): 24 min  Charges:  $Gait Training: 8-22 mins $Therapeutic Activity: 8-22 mins                    Chesley Noon, PTA 01/13/21, 10:03 AM

## 2021-01-13 NOTE — TOC Progression Note (Signed)
Transition of Care [facility]) - Progression Note    Patient Details  Name: Megan [name] MRN: [id number] Date of Birth: 04/05/1955  Transition of Care [facility]) CM/SW [place], RN Phone Number: 01/13/2021, 10:10 AM  Clinical Narrative:   Hospital bed will be delivered to the home, [facility] has been set up [facility] services at [place]    Expected Discharge Plan: [place]    Expected Discharge Plan and Services Expected Discharge Plan: [place]   Discharge Planning Services: CM Consult Post Acute Care Choice: Durable Medical Equipment, Home Health Living arrangements for the past 2 months: [place] ([place] with daughter/son-in-law and grandson)                 DME Arranged: 3-N-1, Walker rolling, Hospital bed DME Agency: [employer] Date DME Agency Contacted: 01/12/21 Time DME Agency Contacted: 29 Representative spoke with at DME Agency: [place] ([place]) Interventions    Readmission Risk Interventions No flowsheet data found.

## 2021-01-13 NOTE — Progress Notes (Signed)
  Subjective:  Patient reports pain as mild.    Objective:   VITALS:   Vitals:   01/12/21 1642 01/12/21 1949 01/13/21 0418 01/13/21 0752  BP: (!) 147/81 134/69 113/84 118/69  Pulse: (!) 102 95 100 (!) 105  Resp: 20 18 16 14   Temp: 98.6 F (37 C) 99 F (37.2 C) 99.4 F (37.4 C) 98.7 F (37.1 C)  TempSrc: Oral  Oral   SpO2: 96% 100% 98% 97%  Weight:      Height:        PHYSICAL EXAM:  Sensation intact distally Dorsiflexion/Plantar flexion intact Incision: dressing C/D/I No cellulitis present Compartment soft  LABS  Results for orders placed or performed during the hospital encounter of 01/11/21 (from the past 24 hour(s))  CBC     Status: Abnormal   Collection Time: 01/13/21  4:12 AM  Result Value Ref Range   WBC 13.5 (H) 4.0 - 10.5 K/uL   RBC 3.30 (L) 3.87 - 5.11 MIL/uL   Hemoglobin 10.2 (L) 12.0 - 15.0 g/dL   HCT 29.8 (L) 36.0 - 46.0 %   MCV 90.3 80.0 - 100.0 fL   MCH 30.9 26.0 - 34.0 pg   MCHC 34.2 30.0 - 36.0 g/dL   RDW 15.3 11.5 - 15.5 %   Platelets 205 150 - 400 K/uL   nRBC 0.0 0.0 - 0.2 %    DG HIP OPERATIVE UNILAT W OR W/O PELVIS LEFT  Result Date: 01/11/2021 CLINICAL DATA:  Elective surgery. EXAM: OPERATIVE LEFT HIP (WITH PELVIS IF PERFORMED) TECHNIQUE: Fluoroscopic spot image(s) were submitted for interpretation post-operatively. COMPARISON:  None. FINDINGS: Two fluoroscopic spot views of the left hip obtained in the operating room. Left hip arthroplasty in expected alignment. Fluoroscopy time 12 seconds. IMPRESSION: Procedural fluoroscopy for left hip arthroplasty. Electronically Signed   By: Keith Rake M.D.   On: 01/11/2021 15:13    Assessment/Plan: 2 Days Post-Op   Active Problems:   History of total hip replacement, left   Up with therapy Plan home with home health tomorrow She has received the hospital bed   Lovell Sheehan , MD 01/13/2021, 11:17 AM

## 2021-01-13 NOTE — Progress Notes (Signed)
Discussed with patient discharges plans as patients leaves at home with daughter. Patient has also had a BM. Patients pain controlled with PRN medication.

## 2021-01-14 ENCOUNTER — Encounter: Payer: Self-pay | Admitting: Orthopedic Surgery

## 2021-01-14 DIAGNOSIS — E1169 Type 2 diabetes mellitus with other specified complication: Secondary | ICD-10-CM | POA: Diagnosis not present

## 2021-01-14 DIAGNOSIS — I10 Essential (primary) hypertension: Secondary | ICD-10-CM

## 2021-01-14 NOTE — Progress Notes (Signed)
Occupational Therapy Treatment Patient Details Name: Megan [name] MRN: [id number] DOB: September 27, 1955 Today's Date: 01/14/2021   History of present illness Pt is a 65 yo F diagnosed with osteoarthritis of the left hip and is s/p elective L THA. PMH includes: HTN, GERD, alcohol abuse, DM, and asthma.   OT comments  Pt seen for OT tx this date. Pt required increased time and effort to perform bed mobility and ADL transfers 2/2 L hip pain. But no direct physical assist required and pt motivated to do as much for herself as possible. Set up for toilet paper and pt able to perform hygiene with lateral lean. Pt instructed in AE and home/routines modifications for LB bathing, dressing, and toileting. Pt continues to benefit from skilled OT Services. Continue to recommend [place] services to maximize return to PLOF.    Recommendations for follow up therapy are one component of a multi-disciplinary discharge planning process, led by the attending physician.  Recommendations may be updated based on patient status, additional functional criteria and insurance authorization.    Follow Up Recommendations  Home health OT    Assistance Recommended at Discharge PRN  Equipment Recommendations  BSC;Other (comment) (reacher, LH sponge)    Recommendations for Other Services      Precautions / Restrictions Precautions Precautions: Anterior Hip Precaution Booklet Issued: Yes (comment) Restrictions Weight Bearing Restrictions: Yes LLE Weight Bearing: Weight bearing as tolerated       Mobility Bed Mobility Overal bed mobility: Modified Independent Bed Mobility: Supine to Sit;Sit to Supine     Supine to sit: Supervision Sit to supine: Supervision   General bed mobility comments: increased time/effort 2/2 pain    Transfers Overall transfer level: Needs assistance Equipment used: Rolling walker (2 wheels) Transfers: Sit to/from Stand Sit to Stand: Supervision           General transfer  comment: SBA, increased time/effort 2/2 pain     Balance Overall balance assessment: Needs assistance Sitting-balance support: Feet supported;No upper extremity supported Sitting balance-Leahy Scale: Good     Standing balance support: During functional activity;Bilateral upper extremity supported Standing balance-Leahy Scale: Good                             ADL either performed or assessed with clinical judgement   ADL Overall ADL's : Needs assistance/impaired                         Toilet Transfer: Min guard;Supervision/safety;BSC;Rolling walker (2 wheels) Toilet Transfer Details (indicate cue type and reason): increased time/effort 2/2 pain Toileting- Clothing Manipulation and Hygiene: Set up;Sitting/lateral lean       Functional mobility during ADLs: Set up;Min guard;Rolling walker (2 wheels)       Vision       Perception     Praxis      Cognition Arousal/Alertness: Awake/alert Behavior During Therapy: WFL for tasks assessed/performed Overall Cognitive Status: Within Functional Limits for tasks assessed                                            Exercises Other Exercises Other Exercises: Pt instructed in AE and home/routines modifications for LB bathing, dressing, and toileting.   Shoulder Instructions       General Comments      Pertinent Vitals/ Pain  Pain Assessment: 0-10 Pain Score: 7  Pain Location: 7 with ADL/mobility, 5 at rest (pt reports pain meds ~1hr ago) Pain Descriptors / Indicators: Sore;Aching;Grimacing;Guarding Pain Intervention(s): Limited activity within patient's tolerance;Monitored during session;Premedicated before session;Repositioned  Home Living                                          Prior Functioning/Environment              Frequency  Min 2X/week        Progress Toward Goals  OT Goals(current goals can now be found in the care plan section)   Progress towards OT goals: Progressing toward goals  Acute Rehab OT Goals Patient Stated Goal: less pain OT Goal Formulation: With patient Time For Goal Achievement: 01/26/21 Potential to Achieve Goals: Good  Plan Discharge plan remains appropriate;Frequency remains appropriate    Co-evaluation                 AM-PAC OT "6 Clicks" Daily Activity     Outcome Measure   Help from another person eating meals?: None Help from another person taking care of personal grooming?: None Help from another person toileting, which includes using toliet, bedpan, or urinal?: A Little Help from another person bathing (including washing, rinsing, drying)?: A Little Help from another person to put on and taking off regular upper body clothing?: None Help from another person to put on and taking off regular lower body clothing?: A Little 6 Click Score: 21    End of Session Equipment Utilized During Treatment: Rolling walker (2 wheels)  OT Visit Diagnosis: Other abnormalities of gait and mobility (R26.89);Pain Pain - Right/Left: Left Pain - part of body: Hip   Activity Tolerance Patient tolerated treatment well   Patient Left in bed;with call bell/phone within reach;with bed alarm set   Nurse Communication          Time: 4496-7591 OT Time Calculation (min): 15 min  Charges: OT General Charges $OT Visit: 1 Visit OT Treatments $Self Care/Home Management : 8-22 mins  Ardeth Perfect., MPH, MS, OTR/L ascom [phone] 01/14/21, 1:28 PM

## 2021-01-14 NOTE — Progress Notes (Signed)
PT Cancellation Note  Patient Details Name: Megan [name] MRN: [id number] DOB: June 02, 1955   Cancelled Treatment:    Reason Eval/Treat Not Completed: Fatigue/lethargy limiting ability to participate  Pt awaiting discharge.  Per chat conversation with MD earlier, plan is for pt to return home today.  She stated she is fatigued from mobility today and frequent up/down to bathroom.  Declined session at this time in anticipation of needed strength for home.     Chesley Noon 01/14/2021, 3:31 PM

## 2021-01-14 NOTE — Care Management Important Message (Signed)
Important Message  Patient Details  Name: Megan [name] MRN: [id number] Date of Birth: February 10, 1956   Medicare Important Message Given:  Yes     Juliann Pulse A Heavyn Yearsley 01/14/2021, 12:15 PM

## 2021-01-14 NOTE — Progress Notes (Signed)
Physical Therapy Treatment Patient Details Name: Megan [name] MRN: [id number] DOB: 1955-04-15 Today's Date: 01/14/2021   History of Present Illness Pt is a 65 yo F diagnosed with osteoarthritis of the left hip and is s/p elective L THA. PMH includes: HTN, GERD, alcohol abuse, DM, and asthma.    PT Comments    OOB on her own today with increased time.  Stood and is able to walk to gym for threshold step training then continue gait to nursing station, bathroom to void then back to bed.  She takes more time and encouragement to get back to bed on her own.  Min cues and encouragement given but no physical assist.  Pt stating she does not feel ready to go home and wants "one more day" to get in and out of bed easier.  Stated daughter will be home but "does not want the responsibility".  Pt encouraged to talk with MD as I was not able to make that decision.  Will relay concerns to TOC.  Pt is progressing well towards goals as expected.   Recommendations for follow up therapy are one component of a multi-disciplinary discharge planning process, led by the attending physician.  Recommendations may be updated based on patient status, additional functional criteria and insurance authorization.  Follow Up Recommendations  Home health PT     Assistance Recommended at Discharge Intermittent Supervision/Assistance  Equipment Recommendations  3in1 (PT);Rolling walker (2 wheels);Hospital bed    Recommendations for Other Services       Precautions / Restrictions Precautions Precautions: Anterior Hip Precaution Booklet Issued: No Restrictions Weight Bearing Restrictions: Yes LLE Weight Bearing: Weight bearing as tolerated     Mobility  Bed Mobility Overal bed mobility: Modified Independent Bed Mobility: Supine to Sit;Sit to Supine     Supine to sit: Supervision Sit to supine: Supervision   General bed mobility comments: increased time and cues/encouragement to do on her own but is  able to today    Transfers Overall transfer level: Needs assistance Equipment used: Rolling walker (2 wheels) Transfers: Sit to/from Stand Sit to Stand: Supervision                Ambulation/Gait Ambulation/Gait assistance: Min guard Gait Distance (Feet): 140 Feet Assistive device: Rolling walker (2 wheels) Gait Pattern/deviations: Step-through pattern;Decreased step length - right;Decreased step length - left Gait velocity: decreased       Stairs Stairs: Yes Stairs assistance: Min assist;Min guard Stair Management: One rail Right;With walker;Forwards Number of Stairs: 1 General stair comments: threshold step with walker to simulate getting in/out of home  - prefers to hold porch rail on right and walker on left   Wheelchair Mobility    Modified Rankin (Stroke Patients Only)       Balance Overall balance assessment: Needs assistance Sitting-balance support: Feet supported;No upper extremity supported Sitting balance-Leahy Scale: Good     Standing balance support: During functional activity;Bilateral upper extremity supported Standing balance-Leahy Scale: Good                              Cognition Arousal/Alertness: Awake/alert Behavior During Therapy: WFL for tasks assessed/performed Overall Cognitive Status: Within Functional Limits for tasks assessed                                          Exercises Other Exercises  Other Exercises: to bathroom to void    General Comments        Pertinent Vitals/Pain Pain Assessment: Faces Faces Pain Scale: Hurts little more Pain Descriptors / Indicators: Sore;Aching;Grimacing;Guarding Pain Intervention(s): Limited activity within patient's tolerance;Monitored during session;Repositioned    Home Living                          Prior Function            PT Goals (current goals can now be found in the care plan section) Progress towards PT goals: Progressing toward  goals    Frequency    BID      PT Plan Current plan remains appropriate;Other (comment)    Co-evaluation              AM-PAC PT "6 Clicks" Mobility   Outcome Measure  Help needed turning from your back to your side while in a flat bed without using bedrails?: A Little Help needed moving from lying on your back to sitting on the side of a flat bed without using bedrails?: A Little Help needed moving to and from a bed to a chair (including a wheelchair)?: None Help needed standing up from a chair using your arms (e.g., wheelchair or bedside chair)?: None Help needed to walk in hospital room?: None Help needed climbing 3-5 steps with a railing? : A Little 6 Click Score: 21    End of Session Equipment Utilized During Treatment: Gait belt Activity Tolerance: Patient tolerated treatment well Patient left: in bed;with call bell/phone within reach;with bed alarm set Nurse Communication: Mobility status PT Visit Diagnosis: Other abnormalities of gait and mobility (R26.89);Muscle weakness (generalized) (M62.81);Pain Pain - Right/Left: Left Pain - part of body: Hip     Time: 7588-3254 PT Time Calculation (min) (ACUTE ONLY): 24 min  Charges:  $Gait Training: 8-22 mins $Therapeutic Activity: 8-22 mins                    Chesley Noon, PTA 01/14/21, 9:22 AM

## 2021-01-15 LAB — SURGICAL PATHOLOGY

## 2021-01-15 NOTE — Progress Notes (Signed)
OT Cancellation Note  Patient Details Name: Megan [name] MRN: [id number] DOB: 1955/09/04   Cancelled Treatment:    Reason Eval/Treat Not Completed: Patient declined, no reason specified. Upon attempt, pt resting, shares she is eager to discharge later today. Pt declined OOB/ADL. Pt instructed briefly in strategies to minimize risk of RW "scratching up" flooring. <91min spent, no charge visit.   Ardeth Perfect., MPH, MS, OTR/L ascom [phone] 01/15/21, 2:15 PM

## 2021-01-15 NOTE — Progress Notes (Signed)
Physical Therapy Treatment Patient Details Name: Megan [name] MRN: [id number] DOB: 11-09-1955 Today's Date: 01/15/2021   History of Present Illness Pt is a 65 yo F diagnosed with osteoarthritis of the left hip and is s/p elective L THA. PMH includes: HTN, GERD, alcohol abuse, DM, and asthma.    PT Comments    Patient received sitting eob. She is agreeable to PT session. Reports she feels better today and ready to return home. She is mod independent for bed mobility and transfers. Ambulated 175 feet with RW and supervision. Patient will continue to benefit from skilled PT while here to improve endurance and independence.     Recommendations for follow up therapy are one component of a multi-disciplinary discharge planning process, led by the attending physician.  Recommendations may be updated based on patient status, additional functional criteria and insurance authorization.  Follow Up Recommendations  Home health PT     Assistance Recommended at Discharge Intermittent Supervision/Assistance  Equipment Recommendations  Rolling walker (2 wheels);3in1 (PT);Hospital bed    Recommendations for Other Services       Precautions / Restrictions Precautions Precautions: Anterior Hip Precaution Booklet Issued: Yes (comment) Restrictions Weight Bearing Restrictions: Yes LLE Weight Bearing: Weight bearing as tolerated     Mobility  Bed Mobility Overal bed mobility: Modified Independent             General bed mobility comments: patient received sitting edge of bed and remained at EOB at end of session    Transfers Overall transfer level: Modified independent Equipment used: Rolling walker (2 wheels) Transfers: Sit to/from Stand Sit to Stand: Modified independent (Device/Increase time)                Ambulation/Gait Ambulation/Gait assistance: Supervision Gait Distance (Feet): 175 Feet Assistive device: Rolling walker (2 wheels) Gait Pattern/deviations:  Step-through pattern;Decreased step length - right;Decreased step length - left Gait velocity: decreased   General Gait Details: slight fatigue with ambulation, slow pace. No increased pain   Stairs             Wheelchair Mobility    Modified Rankin (Stroke Patients Only)       Balance Overall balance assessment: Modified Independent   Sitting balance-Leahy Scale: Normal     Standing balance support: Bilateral upper extremity supported;During functional activity Standing balance-Leahy Scale: Good Standing balance comment: no balance concerns with use of RW for dynamic standing task                            Cognition Arousal/Alertness: Awake/alert Behavior During Therapy: WFL for tasks assessed/performed Overall Cognitive Status: Within Functional Limits for tasks assessed                                          Exercises Other Exercises Other Exercises: reviewed and patient demonstated HEP    General Comments        Pertinent Vitals/Pain Pain Assessment: 0-10 Pain Score: 1  Pain Descriptors / Indicators: Discomfort Pain Intervention(s): Monitored during session    Home Living                          Prior Function            PT Goals (current goals can now be found in the care plan section) Acute  Rehab PT Goals Patient Stated Goal: to go home today PT Goal Formulation: With patient Time For Goal Achievement: 01/24/21 Potential to Achieve Goals: Good Progress towards PT goals: Progressing toward goals    Frequency    BID      PT Plan Current plan remains appropriate    Co-evaluation              AM-PAC PT "6 Clicks" Mobility   Outcome Measure  Help needed turning from your back to your side while in a flat bed without using bedrails?: None Help needed moving from lying on your back to sitting on the side of a flat bed without using bedrails?: None Help needed moving to and from a bed to  a chair (including a wheelchair)?: None Help needed standing up from a chair using your arms (e.g., wheelchair or bedside chair)?: None Help needed to walk in hospital room?: None Help needed climbing 3-5 steps with a railing? : A Little 6 Click Score: 23    End of Session Equipment Utilized During Treatment: Gait belt Activity Tolerance: Patient tolerated treatment well Patient left: in bed;with call bell/phone within reach Nurse Communication: Mobility status PT Visit Diagnosis: Other abnormalities of gait and mobility (R26.89);Muscle weakness (generalized) (M62.81);Pain Pain - Right/Left: Left Pain - part of body: Hip     Time: 9381-0175 PT Time Calculation (min) (ACUTE ONLY): 15 min  Charges:  $Gait Training: 8-22 mins                     Taneil Lazarus, PT, GCS 01/15/21,10:05 AM

## 2021-01-15 NOTE — Progress Notes (Signed)
Pt wheeled to car by staff. Daughter providing transportation

## 2021-01-15 NOTE — Discharge Summary (Signed)
Physician Discharge Summary  Patient ID: Megan [name] MRN: [id number] DOB/AGE: [birth year] 65 y.o.  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 female who was admitted 01/11/2021 with a diagnosis of  M16.12 Unilateral primary osteoarthritis, left hip <principal problem not specified> and went to the operating room on 01/11/2021 and underwent the above named procedures.    She was given perioperative antibiotics:  Anti-infectives (From admission, onward)    Start     Dose/Rate Route Frequency Ordered Stop   01/11/21 1800  ceFAZolin (ANCEF) IVPB 1 g/50 mL premix        1 g 100 mL/hr over 30 Minutes Intravenous Every 6 hours 01/11/21 1556 01/12/21 0024   01/11/21 1034  ceFAZolin (ANCEF) 2-4 GM/100ML-% IVPB       Note to Pharmacy: [name]  : cabinet override      01/11/21 1034 01/11/21 1233   01/11/21 0600  ceFAZolin (ANCEF) IVPB 2g/100 mL premix        2 g 200 mL/hr over 30 Minutes Intravenous On call to O.R. 01/10/21 2320 01/11/21 1259     .  She was given sequential compression devices, early ambulation, and aspirin for DVT prophylaxis.  She benefited maximally from the hospital stay and there were no  complications.    Recent vital signs:  Vitals:   01/15/21 0757 01/15/21 1122  BP: 124/74 125/75  Pulse: 84 94  Resp: 16 16  Temp: 97.8 F (36.6 C) 98.1 F (36.7 C)  SpO2: 95% 98%    Recent laboratory studies:  Lab Results  Component Value Date   HGB 10.2 (L) 01/13/2021   HGB 10.1 (L) 01/12/2021   HGB 11.8 (L) 12/05/2020   Lab Results  Component Value Date   WBC 13.5 (H) 01/13/2021   PLT 205 01/13/2021   Lab Results  Component Value Date   INR 1.1 12/20/2020   Lab Results  Component Value Date   NA 134 (L) 01/12/2021   K 3.9 01/12/2021   CL 103 01/12/2021   CO2 24 01/12/2021   BUN 18 01/12/2021   CREATININE 0.76 01/12/2021   GLUCOSE 191 (H) 01/12/2021    Discharge Medications:   Allergies as of 01/15/2021       Reactions   Peanut Butter Flavor Anaphylaxis   Peanut-containing Drug Products Anaphylaxis        Medication List     STOP taking these medications    aspirin EC 81 MG tablet Replaced by: aspirin 81 MG chewable tablet       TAKE these medications    albuterol 108 (90 Base) MCG/ACT inhaler Commonly known as: VENTOLIN HFA Inhale 1-2 puffs into the lungs every 6 (six) hours as needed for wheezing or shortness of breath.  aspirin 81 MG chewable tablet Chew 1 tablet (81 mg total) by mouth 2 (two) times daily. Replaces: aspirin EC 81 MG tablet   atorvastatin 40 MG tablet Commonly known as: LIPITOR Take 1 tablet (40 mg total) by mouth daily.   Biotin 5000 MCG Tabs Take 5,000 mcg by mouth in the morning.   BLACK CURRANT SEED OIL PO Take 1 capsule by mouth in the morning.   blood glucose meter kit and supplies Kit Dispense based on patient and insurance preference. Use up to four times daily as directed.   [employer] CALCIUM 600 + D PO Take 1 tablet by mouth in the morning. Takes 3 times per week   dapagliflozin propanediol 10 MG Tabs tablet Commonly known as: Farxiga Take 1 tablet (10 mg total) by mouth daily.   diphenhydrAMINE 25 MG  tablet Commonly known as: BENADRYL Take 25 mg by mouth daily.   docusate sodium 50 MG capsule Commonly known as: COLACE Take 50 mg by mouth in the morning.   FISH OIL PO Take 2,400 mg by mouth in the morning.   HAIR/SKIN/NAILS PO Take 1 capsule by mouth in the morning.   HYDROcodone-acetaminophen 7.5-325 MG tablet Commonly known as: NORCO Take 1 tablet by mouth every 4 (four) hours as needed for severe pain (pain score 7-10).   ibuprofen 200 MG tablet Commonly known as: ADVIL Take 600 mg by mouth in the morning and at bedtime.   lisinopril 20 MG tablet Commonly known as: ZESTRIL Take 20 mg by mouth daily.   methocarbamol 500 MG tablet Commonly known as: Robaxin Take 1 tablet (500 mg total) by mouth 4 (four) times daily.   omeprazole 20 MG capsule Commonly known as: PRILOSEC Take 1 capsule (20 mg total) by mouth daily.   sitaGLIPtin 100 MG tablet Commonly known as: Januvia Take 1 tablet (100 mg total) by mouth daily.               Durable Medical Equipment  (From admission, onward)           Start     Ordered   01/12/21 1232  For home use only DME Hospital bed  Once       Question Answer Comment  Length of Need 6 Months   The above medical condition requires: Patient requires the ability to reposition frequently   Bed type Semi-electric      01/12/21 1231   01/12/21 0639  For home use only DME 3 n 1  Once        01/12/21 0639   01/12/21 0639  For home use only DME Walker rolling  Once       Question Answer Comment  Walker: With [place] Wheels   Patient needs a walker to treat with the following condition Osteoarthritis of left hip      01/12/21 0639   01/11/21 1557  DME Walker rolling  Once       Question:  Patient needs a walker to treat with the following condition  Answer:  History of total hip replacement, left   01/11/21 1556   01/11/21 1557  DME 3 n 1  Once        01/11/21 1556   01/11/21 1557  DME Bedside commode  Once       Question:   Patient needs a bedside commode to treat with the following condition  Answer:  History of total hip replacement, left   01/11/21 1556  Diagnostic Studies: DG HIP OPERATIVE UNILAT W OR W/O PELVIS LEFT  Result Date: 01/11/2021 CLINICAL DATA:  Elective surgery. EXAM: OPERATIVE LEFT HIP (WITH PELVIS IF PERFORMED) TECHNIQUE: Fluoroscopic spot image(s) were submitted for interpretation post-operatively. COMPARISON:  None. FINDINGS: Two fluoroscopic spot views of the left hip obtained in the operating room. Left hip arthroplasty in expected alignment. Fluoroscopy time 12 seconds. IMPRESSION: Procedural fluoroscopy for left hip arthroplasty. Electronically Signed   By: Keith Rake M.D.   On: 01/11/2021 15:13    Disposition: Discharge disposition: 01-Home or Self Care            Signed: Carlynn Spry ,PA-C 01/15/2021, 2:05 PM

## 2021-01-15 NOTE — Progress Notes (Signed)
PT Cancellation Note  Patient Details Name: Megan [name] MRN: [id number] DOB: August 27, 1955   Cancelled Treatment:    Reason Eval/Treat Not Completed: Patient declined, no reason specified. Patient planning to leave around 5, offered another therapy session, but patient politely declines. States she has been back and forth to bathroom and doesn't want to be too tired prior to going home.     Adamarie Izzo 01/15/2021, 3:28 PM

## 2021-01-15 NOTE — Progress Notes (Signed)
DISCHARGE NOTE:  Pt given discharge instructions, pt verbalized understanding. Pt waiting on ride.

## 2021-01-17 ENCOUNTER — Telehealth: Payer: Self-pay

## 2021-01-17 NOTE — Telephone Encounter (Signed)
Transition Care Management Unsuccessful Follow-up Telephone Call  Date of discharge and from where:  [facility] 01/11/21-01/15/21  Attempts:  1st Attempt  Reason for unsuccessful TCM follow-up call:  Left voice message  Johnney Killian, RN, BSN, CCM Care Management Coordinator Phone: [phone]: [phone]

## 2021-01-18 ENCOUNTER — Telehealth: Payer: Self-pay

## 2021-01-18 NOTE — Telephone Encounter (Signed)
Transition Care Management Unsuccessful Follow-up Telephone Call  Date of discharge and from where:  01/15/2021  [facility]  Attempts:  2nd Attempt  Reason for unsuccessful TCM follow-up call:  No answer/busy   Tomasa Rand, RN, BSN, CEN [place] Coordinator [phone]

## 2021-01-22 ENCOUNTER — Ambulatory Visit (INDEPENDENT_AMBULATORY_CARE_PROVIDER_SITE_OTHER): Payer: Medicare Other

## 2021-01-22 ENCOUNTER — Telehealth: Payer: Medicare Other

## 2021-01-22 ENCOUNTER — Other Ambulatory Visit: Payer: Self-pay

## 2021-01-22 DIAGNOSIS — E1169 Type 2 diabetes mellitus with other specified complication: Secondary | ICD-10-CM

## 2021-01-22 DIAGNOSIS — I10 Essential (primary) hypertension: Secondary | ICD-10-CM

## 2021-01-22 NOTE — Patient Instructions (Signed)
Dear [name],  Below is a summary of the goals we discussed during our follow up appointment on January 22, 2021. Please contact me anytime with questions or concerns.   Visit Information  Patient Care Plan: CCM Pharmacy Care Plan     Problem Identified: Disease Management   Onset Date: 12/25/2020  Note:    Current Barriers:  Suboptimal control - diabetes  Pharmacist Clinical Goal(s):  Patient will achieve improvement in diabetes control as evidenced by A1c through collaboration with PharmD and provider.   Interventions: 1:1 collaboration with Lesleigh Noe, MD regarding development and update of comprehensive plan of care as evidenced by provider attestation and co-signature Inter-disciplinary care team collaboration (see longitudinal plan of care) Comprehensive medication review performed; medication list updated in electronic medical record  Hypertension (BP goal <140/90) -Controlled - per clinic readings (no updates 01/22/21) -Current treatment: Lisinopril 20 mg - 1 tablet daily  -Medications previously tried: none reported  -Current home readings: none reported -Denies hypotensive/hypertensive symptoms -Reviewed adherence, no gaps in fill history -Recommended to continue current medication  Diabetes (A1c goal <7%) -Uncontrolled - A1c 9.6% (10/24/20) -01/22/21 - Pt reports BG about the same 130-170s. She is recovering from hip surgery. Recommended she rescheduled cancelled nutrition visit once she is feeling up to it. F/u 4 weeks. -Initial visit completed 12/25/20, Pt reported sugars improved with better diet (avoiding starches, eating more vegetables). In July she went on cruise and ate very poorly resulting in high A1c. Prior to this A1c was 5.8% on just [place]. She is also scheduled with nutritionist next week. She would prefer to avoid injections at this time.  -Current medications: Januvia 100 mg - 1 tablet daily (started August 2022) Farxiga 10 mg - 1  tablet daily -Medications previously tried: none   -Current home glucose readings: checks once/day, reports BG have fluctuated  fasting glucose: (highest 175, good day 130s) 01/21/21 - 170 post prandial glucose: n/a -Adherence: Continues Januvia and Farxiga as prescribed -Denies hypoglycemic/hyperglycemic symptoms  -Current meal patterns: she tries to use sugar free substitutes, more veggies and less starches -Current exercise: none currently due to recovery from hip replacement  -She is due for annual eye exam.  -Recommended to continue current medication; Pt will continue to work on diet/reschedule appt with nutritionist. Follow up with CCM in 4 weeks (02/19/21)   Patient Goals/Self-Care Activities Patient will:  - take medications as prescribed -check glucose daily, document, and provide at future appointments  Follow Up Plan: Telephone follow up appointment with care management team member scheduled for:  4 weeks       Patient verbalizes understanding of instructions provided today and agrees to view in [place].   Debbora Dus, PharmD Clinical Pharmacist [facility] [phone]

## 2021-01-22 NOTE — Progress Notes (Signed)
Chronic Care Management Pharmacy Note  01/22/2021 Name:  Megan [name] MRN:  [id number] DOB:  [birth year]  Summary: Follow up for diabetes. She cancelled visit with nutritionist due to surgery. Encouraged her to call and reschedule this once she is feeling better. She would like to bring her BG down further with diet before discussing additional medication. Pt was still not feeling great today so minimal discussion. She reports BG has continued to fluctuate. 130-170s Fasting. Checks daily.  Recommendations/Changes made from today's visit: Reschedule nutrition visit, will follow up in 4 weeks to discuss BG log  Plan: CCM follow up 4 weeks   Subjective: Megan [name] is an 65 y.o. year old female who is a primary patient of Cody, Jobe Marker, MD.  The CCM team was consulted for assistance with disease management and care coordination needs.    Engaged with patient by telephone for follow up visit in response to provider referral for pharmacy case management and/or care coordination services.   Consent to Services:  The patient was given information about Chronic Care Management services, agreed to services, and gave verbal consent prior to initiation of services.  Please see initial visit note for detailed documentation.   Patient Care Team: Lesleigh Noe, MD as PCP - General (Family Medicine) Debbora Dus, [facility] as Pharmacist (Pharmacist)  Recent office visits:  12/05/2020 - Waunita Schooner, MD - Patient presented for pre-op clearance and diabetes. Labs: CBC, CMP, Protime-INR, EKG. Start: sulfamethoxazole-trimethoprim (BACTRIM DS) 800-160 MG tablet 10/24/2020 - Waunita Schooner, MD - Patient presented for diabetes and vaginal itching. Labs: A1c. Start: sitaGLIPtin (JANUVIA) 100 MG tablet.  09/20/2020 - Waunita Schooner, MD - Patient presented for follow up for alcohol abuse and hyperlipidemia. Patient sober 4 days. Labs: CBC, CMP, Lipid. Referral to [facility] and to  Home Health.  No medication changes.    Recent consult visits:  12/19/2020 - Orthopedics - Patient presented for left hip pain. Plan is for left total hip replacement. Procedure is scheduled on: Monday. January 07, 2021.  06/25/2020 - Jonathon Bellows, MD - Patient presented for Colonoscopy with Propofol.    Hospital visits:  01/11/21 - Total hip surgery   Objective:  Lab Results  Component Value Date   CREATININE 0.76 01/12/2021   BUN 18 01/12/2021   GFR 67.66 12/05/2020   GFRNONAA >60 01/12/2021   NA 134 (L) 01/12/2021   K 3.9 01/12/2021   CALCIUM 10.9 (H) 01/12/2021   CO2 24 01/12/2021   GLUCOSE 191 (H) 01/12/2021    Lab Results  Component Value Date/Time   HGBA1C 9.6 (A) 10/24/2020 09:20 AM   HGBA1C 5.8 (A) 06/18/2020 10:13 AM   HGBA1C 5.8 12/12/2019 12:00 AM   GFR 67.66 12/05/2020 09:11 AM   GFR 69.63 09/20/2020 10:45 AM   MICROALBUR Negative 12/12/2019 12:00 AM    Last diabetic Eye exam: Referral place 12/19/20 by PCP  Last diabetic Foot exam: 10/24/20 - completed by PCP    Lab Results  Component Value Date   CHOL 180 09/20/2020   HDL 68.20 09/20/2020   LDLCALC 84 09/20/2020   TRIG 138.0 09/20/2020   CHOLHDL 3 09/20/2020    Hepatic Function Latest Ref Rng & Units 12/05/2020 09/20/2020 02/15/2020  Total Protein 6.0 - 8.3 g/dL 8.2 8.2 8.6(H)  Albumin 3.5 - 5.2 g/dL 3.7 4.1 4.2  AST 0 - 37 U/L 51(H) 73(H) 104(H)  ALT 0 - 35 U/L 29 40(H) 40(H)  Alk Phosphatase 39 - 117 U/L 432(H) 330(H)  367(H)  Total Bilirubin 0.2 - 1.2 mg/dL 1.0 1.0 0.7    No results found for: TSH, FREET4  CBC Latest Ref Rng & Units 01/13/2021 01/12/2021 12/05/2020  WBC 4.0 - 10.5 K/uL 13.5(H) 10.7(H) 8.8  Hemoglobin 12.0 - 15.0 g/dL 10.2(L) 10.1(L) 11.8(L)  Hematocrit 36.0 - 46.0 % 29.8(L) 30.0(L) 36.8  Platelets 150 - 400 K/uL 205 214 266.0    No results found for: VD25OH  Clinical ASCVD: No  The 10-year ASCVD risk score (Arnett DK, et al., 2019) is: 35.1%   Values used to calculate the  score:     Age: 23 years     Sex: Female     Is Non-Hispanic African American: Yes     Diabetic: Yes     Tobacco smoker: Yes     Systolic Blood Pressure: 887 mmHg     Is BP treated: Yes     HDL Cholesterol: 68.2 mg/dL     Total Cholesterol: 180 mg/dL    Depression screen [facility] 2/9 02/20/2020 02/15/2020 02/15/2020  Decreased Interest '3 3 3  ' Down, Depressed, Hopeless 3 3 -  PHQ - 2 Score '6 6 3  ' Altered sleeping 3 3 -  Tired, decreased energy '2 2 2  ' Change in appetite '2 2 2  ' Feeling bad or failure about yourself  3 3 -  Trouble concentrating 0 0 -  Moving slowly or fidgety/restless 0 0 -  Suicidal thoughts 0 0 -  PHQ-9 Score 16 16 -    Social History   Tobacco Use  Smoking Status Every Day   Packs/day: 0.25   Years: 50.00   Pack years: 12.50   Types: Cigarettes  Smokeless Tobacco Never   BP Readings from Last 3 Encounters:  01/15/21 129/83  01/07/21 126/77  12/20/20 120/80   Pulse Readings from Last 3 Encounters:  01/15/21 88  01/07/21 95  12/20/20 86   Wt Readings from Last 3 Encounters:  01/11/21 164 lb (74.4 kg)  01/07/21 164 lb (74.4 kg)  12/20/20 166 lb (75.3 kg)   BMI Readings from Last 3 Encounters:  01/11/21 26.47 kg/m  01/07/21 26.47 kg/m  12/20/20 26.79 kg/m    Assessment/Interventions: Review of patient past medical history, allergies, medications, health status, including review of consultants reports, laboratory and other test data, was performed as part of comprehensive evaluation and provision of chronic care management services.   SDOH:  (Social Determinants of Health) assessments and interventions performed: Yes   SDOH Screenings   Alcohol Screen: Not on file  Depression (PHQ2-9): Medium Risk   PHQ-2 Score: 16  Financial Resource Strain: Low Risk    Difficulty of Paying Living Expenses: Not very hard  Food Insecurity: Not on file  Housing: Not on file  Physical Activity: Not on file  Social Connections: Not on file  Stress: Not on  file  Tobacco Use: High Risk   Smoking Tobacco Use: Every Day   Smokeless Tobacco Use: Never   Passive Exposure: Not on file  Transportation Needs: Not on file    Caleen Taaffe  Allergies  Allergen Reactions   Peanut Butter Flavor Anaphylaxis   Peanut-Containing Drug Products Anaphylaxis    Medications Reviewed Today     Reviewed by Derrek Monaco, RN (Registered Nurse) on 01/11/21 at [place] List Status: Complete   Medication Order Taking? Sig Documenting Provider Last Dose Status Informant  albuterol (PROVENTIL HFA;VENTOLIN HFA) 108 (90 Base) MCG/ACT inhaler [id number] Yes Inhale 1-2 puffs into the lungs  every 6 (six) hours as needed for wheezing or shortness of breath. [provider] 01/11/2021 0900 Active Self  aspirin EC 81 MG tablet [id number] No Take 81 mg by mouth in the morning. Swallow whole. [provider] 01/04/2021 Active Self  atorvastatin (LIPITOR) 40 MG tablet [id number] Yes Take 1 tablet (40 mg total) by mouth daily. Elby Beck, FNP 01/11/2021 0900 Active Self  Biotin 5000 MCG TABS [id number] No Take 5,000 mcg by mouth in the morning. [provider] 01/04/2021 Active Self  Biotin w/ Vitamins C & E (HAIR/SKIN/NAILS PO) [id number] No Take 1 capsule by mouth in the morning. [provider] 01/04/2021 Active Self  BLACK CURRANT SEED OIL PO [id number] No Take 1 capsule by mouth in the morning. [provider] 01/04/2021 Active Self  blood glucose meter kit and supplies KIT [id number]  Dispense based on patient and insurance preference. Use up to four times daily as directed. Lesleigh Noe, MD  Active Self  Calcium Carbonate-Vitamin D ([employer] CALCIUM 600 + D PO) [id number] No Take 1 tablet by mouth in the morning. Takes 3 times per week [provider] 01/04/2021 Active Self  dapagliflozin propanediol (FARXIGA) 10 MG TABS tablet [id number] Yes Take 1 tablet (10 mg total) by mouth daily. Lesleigh Noe, MD 01/10/2021  Active Self  diphenhydrAMINE (BENADRYL) 25 MG tablet [id number] No Take 25 mg by mouth daily. [provider] 01/04/2021 Active Self  docusate sodium (COLACE) 50 MG capsule [id number] No Take 50 mg by mouth in the morning. [provider] 01/04/2021 Active Self  ibuprofen (ADVIL) 200 MG tablet [id number]  Take 600 mg by mouth in the morning and at bedtime.  Patient not taking: Reported on 12/25/2020   [provider]  Active Self  lisinopril (ZESTRIL) 20 MG tablet [id number] No Take 20 mg by mouth daily. [provider] 01/04/2021 Active Self  Omega-3 Fatty Acids (FISH OIL PO) [id number] No Take 2,400 mg by mouth in the morning. [provider] 01/04/2021 Active Self  omeprazole (PRILOSEC) 20 MG capsule [id number] Yes Take 1 capsule (20 mg total) by mouth daily. Lesleigh Noe, MD 01/11/2021 0900 Active Self  sitaGLIPtin (JANUVIA) 100 MG tablet [id number] No Take 1 tablet (100 mg total) by mouth daily. Lesleigh Noe, MD 01/04/2021 Active Self            Patient Active Problem List   Diagnosis Date Noted   History of total hip replacement, left 01/11/2021   Left breast abscess 12/05/2020   Tobacco use 12/05/2020   Fatty liver disease, nonalcoholic 83/43/7357   Arthritis of left hip 09/20/2020   Assistance needed with transportation 09/20/2020   Hepatomegaly 05/17/2020   Alcohol abuse 01/19/2018   Hyperparathyroidism ([place]) 01/19/2018   Mixed hyperlipidemia 01/19/2018   Class 1 obesity with body mass index (BMI) of 33.0 to 33.9 in adult 05/27/2017   Essential hypertension 05/27/2017   Gastroesophageal reflux disease without esophagitis 05/27/2017   Mucopurulent chronic bronchitis ([place]) 05/27/2017   Type 2 diabetes mellitus with other specified complication ([place]) 89/78/4784    Immunization History  Administered Date(s) Administered   Moderna Sars-Covid-2 Vaccination 07/11/2019, 08/11/2019    Conditions to be addressed/monitored:   Hypertension and Diabetes  Care Plan : [place]  Updates made by Debbora Dus, [facility] since 01/22/2021 12:00 AM     Problem: Disease Management   Onset Date: 12/25/2020  Note:    Current Barriers:  Suboptimal control - diabetes  Pharmacist Clinical Goal(s):  Patient will achieve improvement in diabetes control as evidenced by A1c through collaboration with PharmD and provider.   Interventions: 1:1 collaboration with Lesleigh Noe, MD regarding development and update of comprehensive plan of care as evidenced by provider attestation and co-signature Inter-disciplinary care team collaboration (see longitudinal plan of care) Comprehensive medication review performed; medication list updated in electronic medical record  Hypertension (BP goal <140/90) -Controlled - per clinic readings (no updates 01/22/21) -Current treatment: Lisinopril 20 mg - 1 tablet daily  -Medications previously tried: none reported  -Current home readings: none reported -Denies hypotensive/hypertensive symptoms -Reviewed adherence, no gaps in fill history -Recommended to continue current medication  Diabetes (A1c goal <7%) -Uncontrolled - A1c 9.6% (10/24/20) -01/22/21 - Pt reports BG about the same 130-170s. She is recovering from hip surgery. Recommended she rescheduled cancelled nutrition visit once she is feeling up to it. F/u 4 weeks. -Initial visit completed 12/25/20, Pt reported sugars improved with better diet (avoiding starches, eating more vegetables). In July she went on cruise and ate very poorly resulting in high A1c. Prior to this A1c was 5.8% on just [place]. She is also scheduled with nutritionist next week. She would prefer to avoid injections at this time.  -Current medications: Januvia 100 mg - 1 tablet daily (started August 2022) Farxiga 10 mg - 1 tablet daily -Medications previously tried: none   -Current home glucose readings: checks once/day, reports BG have fluctuated   fasting glucose: (highest 175, good day 130s) 01/21/21 - 170 post prandial glucose: n/a -Adherence: Continues Januvia and Farxiga as prescribed -Denies hypoglycemic/hyperglycemic symptoms  -Current meal patterns: she tries to use sugar free substitutes, more veggies and less starches -Current exercise: none currently due to recovery from hip replacement  -She is due for annual eye exam.  -Recommended to continue current medication; Pt will continue to work on diet/reschedule appt with nutritionist. Follow up with CCM in 4 weeks (02/19/21)   Patient Goals/Self-Care Activities Patient will:  - take medications as prescribed -check glucose daily, document, and provide at future appointments  Follow Up Plan: Telephone follow up appointment with care management team member scheduled for:  4 weeks        Medication Assistance: None required.  Patient affirms current coverage meets needs.  Compliance/Adherence/Medication fill history: Care Gaps: DEXA Scan - DUE Eye Exam - DUE (pt waiting to hear back from referral)  Star Rating Drugs:  Medication:                Last Fill:         Day Supply Farxiga 90m              01/12/2021      30 Januvia 1011m          10/24/2020      90         Atorvastatin 4098m     11/27/2020      30  Lisinopril 80m57m         01/12/2021      30          Atorvastatin past due for refill - due 12/27/20. Januvia coming up soon - due 01/22/21  Patient's preferred pharmacy is:  [facility] [id number]: [phone]: [phone] pill box? Yes Pt endorses 100% compliance  Care Plan and Follow Up Patient Decision:  Patient agrees to Care Plan and Follow-up.   Debbora Dus, PharmD Clinical Pharmacist [facility] [phone]

## 2021-02-13 DIAGNOSIS — E1169 Type 2 diabetes mellitus with other specified complication: Secondary | ICD-10-CM | POA: Diagnosis not present

## 2021-02-13 DIAGNOSIS — I10 Essential (primary) hypertension: Secondary | ICD-10-CM

## 2021-02-14 NOTE — Progress Notes (Signed)
Chronic Care Management Pharmacy Note  02/19/2021 Name:  Megan [name] MRN:  [id number] DOB:  [birth year]  Summary: -Pt is not checking BG right now since she is staying with her sister and forgot glucomoter; she denies s/sx of high or low sugars and reports compliance with [place] and Januvia  Recommendations/Changes made from today's visit: -Advised pt to schedule PCP visit to recheck A1c; she reports she will make one after the holidays  Plan: -[place] will call patient 1 month for BG readings -Pharmacist follow up televisit scheduled for 2 months   Subjective: Megan [name] is an 65 y.o. year old female who is a primary patient of Cody, Jobe Marker, MD.  The CCM team was consulted for assistance with disease management and care coordination needs.    Engaged with patient by telephone for follow up visit in response to provider referral for pharmacy case management and/or care coordination services.   Consent to Services:  The patient was given information about Chronic Care Management services, agreed to services, and gave verbal consent prior to initiation of services.  Please see initial visit note for detailed documentation.   Patient Care Team: Lesleigh Noe, MD as PCP - General (Family Medicine) Debbora Dus, [facility] as Pharmacist (Pharmacist)  Recent office visits:  12/05/2020 - Waunita Schooner, MD - Patient presented for pre-op clearance and diabetes. Labs: CBC, CMP, Protime-INR, EKG. Start: (BACTRIM DS) 800-160 MG tablet  10/24/2020 - Waunita Schooner, MD - Patient presented for diabetes and vaginal itching. Labs: A1c. Start: Januvia 100 mg daily  09/20/2020 - Waunita Schooner, MD - Patient presented for follow up for alcohol abuse and hyperlipidemia. Patient sober 4 days. Labs: CBC, CMP, Lipid. Referral to [facility] Coordination and to Home Health.  No medication changes.    Recent consult visits:  12/19/2020 - Orthopedics - Patient presented for left  hip pain. Plan is for left total hip replacement. Procedure is scheduled on: Monday. January 07, 2021.   06/25/2020 - Jonathon Bellows, MD - Patient presented for Colonoscopy with Propofol.    Hospital visits: Medication Reconciliation was completed by comparing discharge summary, patient's EMR and Pharmacy list, and upon discussion with patient.  Admitted to the hospital on 01/11/21 due to Left THA. Discharge date was . Discharged from 01/15/21 [place]?Medications Started at [facility] Discharge:?? -started methocarbamol and hydrocodone-APAP  Medication Changes at Hospital Discharge: -Changed aspirin EC 81 mg to chewable aspirin 81 mg BID  Medications that remain the same after Hospital Discharge:??  -All other medications will remain the same.     Objective:  Lab Results  Component Value Date   CREATININE 0.76 01/12/2021   BUN 18 01/12/2021   GFR 67.66 12/05/2020   GFRNONAA >60 01/12/2021   NA 134 (L) 01/12/2021   K 3.9 01/12/2021   CALCIUM 10.9 (H) 01/12/2021   CO2 24 01/12/2021   GLUCOSE 191 (H) 01/12/2021    Lab Results  Component Value Date/Time   HGBA1C 9.6 (A) 10/24/2020 09:20 AM   HGBA1C 5.8 (A) 06/18/2020 10:13 AM   HGBA1C 5.8 12/12/2019 12:00 AM   GFR 67.66 12/05/2020 09:11 AM   GFR 69.63 09/20/2020 10:45 AM   MICROALBUR Negative 12/12/2019 12:00 AM    Last diabetic Eye exam: Referral place 12/19/20 by PCP  Last diabetic Foot exam: 10/24/20 - completed by PCP    Lab Results  Component Value Date   CHOL 180 09/20/2020   HDL 68.20 09/20/2020   LDLCALC 84  09/20/2020   TRIG 138.0 09/20/2020   CHOLHDL 3 09/20/2020    Hepatic Function Latest Ref Rng & Units 12/05/2020 09/20/2020 02/15/2020  Total Protein 6.0 - 8.3 g/dL 8.2 8.2 8.6(H)  Albumin 3.5 - 5.2 g/dL 3.7 4.1 4.2  AST 0 - 37 U/L 51(H) 73(H) 104(H)  ALT 0 - 35 U/L 29 40(H) 40(H)  Alk Phosphatase 39 - 117 U/L 432(H) 330(H) 367(H)  Total Bilirubin 0.2 - 1.2 mg/dL 1.0 1.0 0.7    No results found for:  TSH, FREET4  CBC Latest Ref Rng & Units 01/13/2021 01/12/2021 12/05/2020  WBC 4.0 - 10.5 K/uL 13.5(H) 10.7(H) 8.8  Hemoglobin 12.0 - 15.0 g/dL 10.2(L) 10.1(L) 11.8(L)  Hematocrit 36.0 - 46.0 % 29.8(L) 30.0(L) 36.8  Platelets 150 - 400 K/uL 205 214 266.0    No results found for: VD25OH  Clinical ASCVD: No  The 10-year ASCVD risk score (Arnett DK, et al., 2019) is: 35.1%   Values used to calculate the score:     Age: 65 years     Sex: Female     Is Non-Hispanic African American: Yes     Diabetic: Yes     Tobacco smoker: Yes     Systolic Blood Pressure: 570 mmHg     Is BP treated: Yes     HDL Cholesterol: 68.2 mg/dL     Total Cholesterol: 180 mg/dL    Depression screen [facility] 2/9 02/20/2020 02/15/2020 02/15/2020  Decreased Interest '3 3 3  ' Down, Depressed, Hopeless 3 3 -  PHQ - 2 Score '6 6 3  ' Altered sleeping 3 3 -  Tired, decreased energy '2 2 2  ' Change in appetite '2 2 2  ' Feeling bad or failure about yourself  3 3 -  Trouble concentrating 0 0 -  Moving slowly or fidgety/restless 0 0 -  Suicidal thoughts 0 0 -  PHQ-9 Score 16 16 -     Social History   Tobacco Use  Smoking Status Every Day   Packs/day: 0.25   Years: 50.00   Pack years: 12.50   Types: Cigarettes  Smokeless Tobacco Never   BP Readings from Last 3 Encounters:  01/15/21 129/83  01/07/21 126/77  12/20/20 120/80   Pulse Readings from Last 3 Encounters:  01/15/21 88  01/07/21 95  12/20/20 86   Wt Readings from Last 3 Encounters:  01/11/21 164 lb (74.4 kg)  01/07/21 164 lb (74.4 kg)  12/20/20 166 lb (75.3 kg)   BMI Readings from Last 3 Encounters:  01/11/21 26.47 kg/m  01/07/21 26.47 kg/m  12/20/20 26.79 kg/m    Assessment/Interventions: Review of patient past medical history, allergies, medications, health status, including review of consultants reports, laboratory and other test data, was performed as part of comprehensive evaluation and provision of chronic care management services.   SDOH:   (Social Determinants of Health) assessments and interventions performed: Yes   SDOH Screenings   Alcohol Screen: Not on file  Depression (PHQ2-9): Medium Risk   PHQ-2 Score: 16  Financial Resource Strain: Low Risk    Difficulty of Paying Living Expenses: Not very hard  Food Insecurity: Not on file  Housing: Not on file  Physical Activity: Not on file  Social Connections: Not on file  Stress: Not on file  Tobacco Use: High Risk   Smoking Tobacco Use: Every Day   Smokeless Tobacco Use: Never   Passive Exposure: Not on file  Transportation Needs: Not on file    [place]  Allergen Reactions   Peanut Butter Flavor Anaphylaxis   Peanut-Containing Drug Products Anaphylaxis    Medications Reviewed Today     Reviewed by Charlton Haws, [facility] (Pharmacist) on 02/19/21 at 1337  Med List Status: <None>   Medication Order Taking? Sig Documenting Provider Last Dose Status Informant  albuterol (PROVENTIL HFA;VENTOLIN HFA) 108 (90 Base) MCG/ACT inhaler [id number] Yes Inhale 1-2 puffs into the lungs every 6 (six) hours as needed for wheezing or shortness of breath. [provider] Taking Active Self  aspirin 81 MG chewable tablet [id number] Yes Chew 1 tablet (81 mg total) by mouth 2 (two) times daily. Carlynn Spry, PA-C Taking Active   atorvastatin (LIPITOR) 40 MG tablet [id number] Yes Take 1 tablet (40 mg total) by mouth daily. Elby Beck, FNP Taking Active Self  Biotin 5000 MCG TABS [id number] Yes Take 5,000 mcg by mouth in the morning. [provider] Taking Active Self  Biotin w/ Vitamins C & E (HAIR/SKIN/NAILS PO) [id number] Yes Take 1 capsule by mouth in the morning. [provider] Taking Active Self  BLACK CURRANT SEED OIL PO [id number] Yes Take 1 capsule by mouth in the morning. [provider] Taking Active Self  blood glucose meter kit and supplies KIT [id number] Yes Dispense based on patient and insurance preference. Use up  to four times daily as directed. Lesleigh Noe, MD Taking Active Self  Calcium Carbonate-Vitamin D ([employer]S CALCIUM 600 + D PO) [id number] Yes Take 1 tablet by mouth in the morning. Takes 3 times per week [provider] Taking Active Self  dapagliflozin propanediol (FARXIGA) 10 MG TABS tablet [id number] Yes Take 1 tablet (10 mg total) by mouth daily. Lesleigh Noe, MD Taking Active Self  diphenhydrAMINE (BENADRYL) 25 MG tablet [id number] Yes Take 25 mg by mouth daily. [provider] Taking Active Self  docusate sodium (COLACE) 50 MG capsule [id number] Yes Take 50 mg by mouth in the morning. [provider] Taking Active Self  HYDROcodone-acetaminophen (NORCO) 7.5-325 MG tablet [id number]  Take 1 tablet by mouth every 4 (four) hours as needed for severe pain (pain score 7-10). Carlynn Spry, PA-C  Active   lisinopril (ZESTRIL) 20 MG tablet [id number] Yes Take 20 mg by mouth daily. [provider] Taking Active Self    Discontinued 02/19/21 1337 (Completed Course)   Omega-3 Fatty Acids (FISH OIL PO) [id number] Yes Take 2,400 mg by mouth in the morning. [provider] Taking Active Self  omeprazole (PRILOSEC) 20 MG capsule [id number] Yes Take 1 capsule (20 mg total) by mouth daily. Lesleigh Noe, MD Taking Active Self  sitaGLIPtin (JANUVIA) 100 MG tablet [id number] Yes Take 1 tablet (100 mg total) by mouth daily. Lesleigh Noe, MD Taking Active Self            Patient Active Problem List   Diagnosis Date Noted   History of total hip replacement, left 01/11/2021   Left breast abscess 12/05/2020   Tobacco use 12/05/2020   Fatty liver disease, nonalcoholic 99/37/1696   Arthritis of left hip 09/20/2020   Assistance needed with transportation 09/20/2020   Hepatomegaly 05/17/2020   Alcohol abuse 01/19/2018   Hyperparathyroidism ([place]) 01/19/2018   Mixed hyperlipidemia 01/19/2018   Class 1 obesity with body mass index (BMI) of 33.0 to 33.9 in adult  05/27/2017   Essential hypertension 05/27/2017   Gastroesophageal reflux disease without esophagitis 05/27/2017   Mucopurulent chronic bronchitis ([place]) 05/27/2017   Type 2 diabetes mellitus with other specified complication ([place]) 78/93/8101  Immunization History  Administered Date(s) Administered   [employer] Vaccination 07/11/2019, 08/11/2019     Conditions to be addressed/monitored:  Hypertension and Diabetes  Care Plan : [place]  Updates made by Charlton Haws, [place] since 02/19/2021 12:00 AM     Problem: Disease Management   Onset Date: 12/25/2020  Note:   Current Barriers:  Suboptimal control - diabetes  Pharmacist Clinical Goal(s):  Patient will achieve improvement in diabetes control as evidenced by A1c through collaboration with PharmD and provider.   Interventions: 1:1 collaboration with Lesleigh Noe, MD regarding development and update of comprehensive plan of care as evidenced by provider attestation and co-signature Inter-disciplinary care team collaboration (see longitudinal plan of care) Comprehensive medication review performed; medication list updated in electronic medical record  Hypertension (BP goal <140/90) -Controlled - per clinic readings (no updates 01/22/21) -Current treatment: Lisinopril 20 mg - 1 tablet daily  -Medications previously tried: none reported  -Current home readings: none reported -Denies hypotensive/hypertensive symptoms -Reviewed adherence, no gaps in fill history -Recommended to continue current medication  Diabetes (A1c goal <7%) -Uncontrolled - A1c 9.6% (10/24/20); pt is staying with her sister right now and she does not have her glucometer and has not been checking her BG -Initial visit completed 12/25/20, Pt reported sugars improved with better diet (avoiding starches, eating more vegetables). In July she went on cruise and ate very poorly resulting in high A1c. Prior to this A1c was 5.8% on just  [place]. She is also scheduled with nutritionist next week. She would prefer to avoid injections at this time. -Current home glucose readings: checks once/day, reports BG have fluctuated  fasting glucose: not checking - doesn't have meter post prandial glucose: n/a  -Current medications: Januvia 100 mg - 1 tablet daily (started August 2022) Farxiga 10 mg - 1 tablet daily -Medications previously tried: glimepiride, metformin   -Adherence: Continues Januvia and [place] as prescribed -Current meal patterns: she tries to use sugar free substitutes, more veggies and less starches -Current exercise: able to move around more as hip improves  -She is due for annual eye exam.  -Recommended to continue current medication; advised pt to schedule PCP visit for repeat A1c  Hyperlipidemia: (LDL goal < 100) -Controlled -Dx fatty liver disease, alcohol abuse -Current treatment: Atorvastatin 40 mg daily -Educated on Cholesterol goals; Benefits of statin for ASCVD risk reduction; -Recommended to continue current medication  Tobacco use (Goal quit smoking) -Uncontrolled - not discussed today. Reassess next visit.  S/p THA (Goal: manage pain) -Controlled - pt reports pain is improving; she is trying to limit hydrocodone use as much as possible; she has completed methocarbamol -Current treatment  Methocarbamol 500 mg - removed from med list Hydrocodone-APAP - 20 remaining Tylenol 500 mg BID -Recommended to continue current medication  Health Maintenance -Vaccine gaps: Flu, covid booster, Prevnar, Shingrix -Fatty liver, established with Dr Vicente Males 12/14  Patient Goals/Self-Care Activities Patient will:  - take medications as prescribed -check glucose daily, document, and provide at future appointments         Medication Assistance: None required.  Patient affirms current coverage meets needs.  Compliance/Adherence/Medication fill history: Care Gaps: DEXA Scan - DUE Mammogram - due 02/02/21  (ordered 12/05/20)  Star Rating Drugs:  Medication:                Last Fill:         Day Supply Farxiga 37m  01/12/2021      30 PDC 43% Januvia 168m            02/01/2021     90      PDC 12%   Atorvastatin 421m      02/01/2021      30 PDC 86% Lisinopril 2078m          01/12/2021      30       PDC 78%   Patient's preferred pharmacy is:  [facility] [place] [place] [address]: [phone]: [phone] pill box? Yes Pt endorses 100% compliance  Care Plan and Follow Up Patient Decision:  Patient agrees to Care Plan and Follow-up.  Follow Up Plan: Telephone follow up appointment with care management team member scheduled for:  4 weeks   LinCharlene BrookeharmD, BCACP Clinical Pharmacist [phone]

## 2021-02-15 ENCOUNTER — Telehealth: Payer: Self-pay

## 2021-02-15 NOTE — Progress Notes (Addendum)
    Chronic Care Management Pharmacy Assistant   Name: Megan [name]  MRN: [id number] DOB: 25-Dec-1955  Reason for Encounter: CCM (Appointment Reminder)   Medications: Outpatient Encounter Medications as of 02/15/2021  Medication Sig   albuterol (PROVENTIL HFA;VENTOLIN HFA) 108 (90 Base) MCG/ACT inhaler Inhale 1-2 puffs into the lungs every 6 (six) hours as needed for wheezing or shortness of breath.   aspirin 81 MG chewable tablet Chew 1 tablet (81 mg total) by mouth 2 (two) times daily.   atorvastatin (LIPITOR) 40 MG tablet Take 1 tablet (40 mg total) by mouth daily.   Biotin 5000 MCG TABS Take 5,000 mcg by mouth in the morning.   Biotin w/ Vitamins C & E (HAIR/SKIN/NAILS PO) Take 1 capsule by mouth in the morning.   BLACK CURRANT SEED OIL PO Take 1 capsule by mouth in the morning.   blood glucose meter kit and supplies KIT Dispense based on patient and insurance preference. Use up to four times daily as directed.   Calcium Carbonate-Vitamin D (CVS CALCIUM 600 + D PO) Take 1 tablet by mouth in the morning. Takes 3 times per week   dapagliflozin propanediol (FARXIGA) 10 MG TABS tablet Take 1 tablet (10 mg total) by mouth daily.   diphenhydrAMINE (BENADRYL) 25 MG tablet Take 25 mg by mouth daily.   docusate sodium (COLACE) 50 MG capsule Take 50 mg by mouth in the morning.   HYDROcodone-acetaminophen (NORCO) 7.5-325 MG tablet Take 1 tablet by mouth every 4 (four) hours as needed for severe pain (pain score 7-10).   ibuprofen (ADVIL) 200 MG tablet Take 600 mg by mouth in the morning and at bedtime. (Patient not taking: Reported on 12/25/2020)   lisinopril (ZESTRIL) 20 MG tablet Take 20 mg by mouth daily.   methocarbamol (ROBAXIN) 500 MG tablet Take 1 tablet (500 mg total) by mouth 4 (four) times daily.   Omega-3 Fatty Acids (FISH OIL PO) Take 2,400 mg by mouth in the morning.   omeprazole (PRILOSEC) 20 MG capsule Take 1 capsule (20 mg total) by mouth daily.   sitaGLIPtin (JANUVIA) 100 MG  tablet Take 1 tablet (100 mg total) by mouth daily.   No facility-administered encounter medications on file as of 02/15/2021.   Left a message for Megan [name] to remind her of her upcoming telephone visit with Charlene Brooke on 02/19/2021 at 1:00 pm. Patient was reminded to have all medications, supplements and any blood glucose and blood pressure readings available for review at appointment.  Star Rating Drugs: Medication:  Last Fill: Day Supply Atorvastatin 40 mg 02/01/2021 90  Januvia 100 mg 02/01/2021 [place], CPP notified  [name], [place] Pharmacy Assistant [phone]

## 2021-02-19 ENCOUNTER — Other Ambulatory Visit: Payer: Self-pay

## 2021-02-19 ENCOUNTER — Ambulatory Visit (INDEPENDENT_AMBULATORY_CARE_PROVIDER_SITE_OTHER): Payer: Medicare Other | Admitting: Pharmacist

## 2021-02-19 DIAGNOSIS — E782 Mixed hyperlipidemia: Secondary | ICD-10-CM

## 2021-02-19 DIAGNOSIS — E119 Type 2 diabetes mellitus without complications: Secondary | ICD-10-CM

## 2021-02-19 DIAGNOSIS — I10 Essential (primary) hypertension: Secondary | ICD-10-CM

## 2021-02-19 NOTE — Patient Instructions (Signed)
Visit Information  Phone number for Pharmacist: [phone]   Goals Addressed             This Visit's Progress    Monitor and Manage My Blood Sugar-Diabetes Type 2       Timeframe:  Long-Range Goal Priority:  High Start Date:   02/19/21                          Expected End Date:   02/19/22                    Follow Up Date Feb 2023   - check blood sugar at prescribed times - once a day, varying time of day - take the blood sugar meter to all doctor visits    Why is this important?   Checking your blood sugar at home helps to keep it from getting very high or very low.  Writing the results in a diary or log helps the doctor know how to care for you.  Your blood sugar log should have the time, date and the results.  Also, write down the amount of insulin or other medicine that you take.  Other information, like what you ate, exercise done and how you were feeling, will also be helpful.     Notes:         Care Plan : [place]  Updates made by Charlton Haws, [place] since 02/19/2021 12:00 AM     Problem: Disease Management   Onset Date: 12/25/2020  Note:   Current Barriers:  Suboptimal control - diabetes  Pharmacist Clinical Goal(s):  Patient will achieve improvement in diabetes control as evidenced by A1c through collaboration with PharmD and provider.   Interventions: 1:1 collaboration with Lesleigh Noe, MD regarding development and update of comprehensive plan of care as evidenced by provider attestation and co-signature Inter-disciplinary care team collaboration (see longitudinal plan of care) Comprehensive medication review performed; medication list updated in electronic medical record  Hypertension (BP goal <140/90) -Controlled - per clinic readings (no updates 01/22/21) -Current treatment: Lisinopril 20 mg - 1 tablet daily  -Medications previously tried: none reported  -Current home readings: none reported -Denies  hypotensive/hypertensive symptoms -Reviewed adherence, no gaps in fill history -Recommended to continue current medication  Diabetes (A1c goal <7%) -Uncontrolled - A1c 9.6% (10/24/20); pt is staying with her sister right now and she does not have her glucometer and has not been checking her BG -Initial visit completed 12/25/20, Pt reported sugars improved with better diet (avoiding starches, eating more vegetables). In July she went on cruise and ate very poorly resulting in high A1c. Prior to this A1c was 5.8% on just [place]. She is also scheduled with nutritionist next week. She would prefer to avoid injections at this time. -Current home glucose readings: checks once/day, reports BG have fluctuated  fasting glucose: not checking - doesn't have meter post prandial glucose: n/a  -Current medications: Januvia 100 mg - 1 tablet daily (started August 2022) Farxiga 10 mg - 1 tablet daily -Medications previously tried: glimepiride, metformin   -Adherence: Continues Januvia and [place] as prescribed -Current meal patterns: she tries to use sugar free substitutes, more veggies and less starches -Current exercise: able to move around more as hip improves  -She is due for annual eye exam.  -Recommended to continue current medication; advised pt to schedule PCP visit for repeat A1c  Hyperlipidemia: (LDL goal < 100) -Controlled -  Dx fatty liver disease, alcohol abuse -Current treatment: Atorvastatin 40 mg daily -Educated on Cholesterol goals; Benefits of statin for ASCVD risk reduction; -Recommended to continue current medication  Tobacco use (Goal quit smoking) -Uncontrolled - not discussed today. Reassess next visit.  S/p THA (Goal: manage pain) -Controlled - pt reports pain is improving; she is trying to limit hydrocodone use as much as possible; she has completed methocarbamol -Current treatment  Methocarbamol 500 mg - removed from med list Hydrocodone-APAP - 20 remaining Tylenol 500 mg  BID -Recommended to continue current medication  Health Maintenance -Vaccine gaps: Flu, covid booster, Prevnar, Shingrix -Fatty liver, established with Dr Vicente Males 12/14  Patient Goals/Self-Care Activities Patient will:  - take medications as prescribed -check glucose daily, document, and provide at future appointments       Patient verbalizes understanding of instructions provided today and agrees to view in [place].  Telephone follow up appointment with pharmacy team member scheduled for: 2 months  Charlene Brooke, PharmD, [facility] Clinical Pharmacist [place] [facility] [phone]

## 2021-02-19 NOTE — Progress Notes (Signed)
I have personally reviewed this encounter including the documentation in this note and have collaborated with the care management provider regarding care management and care coordination activities to include development and update of the comprehensive care plan. I am certifying that I agree with the content of this note and encounter as supervising physician.    

## 2021-02-27 ENCOUNTER — Ambulatory Visit: Payer: Medicare Other | Admitting: Gastroenterology

## 2021-03-05 ENCOUNTER — Ambulatory Visit (INDEPENDENT_AMBULATORY_CARE_PROVIDER_SITE_OTHER): Payer: Medicare Other | Admitting: Gastroenterology

## 2021-03-05 ENCOUNTER — Encounter: Payer: Self-pay | Admitting: Gastroenterology

## 2021-03-05 ENCOUNTER — Other Ambulatory Visit: Payer: Self-pay | Admitting: Gastroenterology

## 2021-03-05 VITALS — BP 134/91 | HR 79 | Temp 98.4°F | Ht 66.0 in | Wt 167.0 lb

## 2021-03-05 DIAGNOSIS — K769 Liver disease, unspecified: Secondary | ICD-10-CM | POA: Diagnosis not present

## 2021-03-05 DIAGNOSIS — D509 Iron deficiency anemia, unspecified: Secondary | ICD-10-CM

## 2021-03-05 DIAGNOSIS — K7460 Unspecified cirrhosis of liver: Secondary | ICD-10-CM

## 2021-03-05 MED ORDER — DIAZEPAM 5 MG PO TABS: ORAL_TABLET | ORAL | 0 refills | Status: DC

## 2021-03-05 NOTE — Addendum Note (Signed)
Addended by: [name] on: 03/05/2021 11:49 AM   Modules accepted: Orders

## 2021-03-05 NOTE — Progress Notes (Signed)
Megan Darby, MD [address]  [place]  [place], [place] [address]  Main: [phone]  Fax: [phone]    Gastroenterology Consultation  Referring Provider:     Lesleigh Noe, MD Primary Care Physician:  Lesleigh Noe, MD Primary Gastroenterologist:  Dr. Cephas [name] Reason for Consultation:     Cirrhosis of liver, liver lesion        HPI:   Megan [name] is a 65 y.o. female referred by Dr. Einar Pheasant, Jobe Marker, MD  for consultation & management of cirrhosis of liver, liver lesion.  Patient is diagnosed with cirrhosis of liver based on imaging ultrasound in 06/2020.  She underwent ultrasound of the liver secondary to elevated LFTs.  Patient is noticed to have elevated LFTs, dating back to 05/2017.  Most recent LFTs revealed elevated alkaline phosphatase 432, AST 51 in 9/22.  Patient also has chronic iron deficiency anemia, hemoglobin 10 in 10/22.  No evidence of thrombocytopenia.  Mildly elevated PTT.  Patient denies any abdominal distention, swelling of legs.  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  She was drinking heavily for almost 1 year along with her husband who was alcoholic as she was in severe depression when she lived with him.  She quit drinking alcohol on September 16, 2020 after she made a decision to separate from her husband.  She does not been drinking since then.  She does have history of diabetes, not controlled, hemoglobin A1c 9.2.  Patient denies rectal bleeding, black stools, nausea, vomiting, hematemesis.  She is found to have 2.5 x 2.0 x 2.1 cm echogenic lesion in the right lobe of the liver which is indeterminate based on the right upper quadrant ultrasound in 7/22.  Fatty liver with probable early changes of cirrhosis.  Portal vein is patent on the color Doppler with normal directional blood flow towards the liver.  Patient tells me  that she tried to undergo MRI in the past, had severe claustrophobia, could not finish the procedure.  She underwent left hip replacement in October 2022, planning to undergo right hip replacement early next year.  She does smoke cigarettes, 1 pack lasts for 3 days  NSAIDs: None  Antiplts/Anticoagulants/Anti thrombotics: None  GI Procedures:  Screening colonoscopy by Dr. Vicente Males - One 5 mm polyp in the descending colon, removed with a cold snare. Resected and retrieved. - Diverticulosis in the sigmoid colon. - The examination was otherwise normal on direct and retroflexion views. DIAGNOSIS:  A. COLON POLYP, DESCENDING; COLD SNARE:  - TUBULAR ADENOMA.  - NEGATIVE FOR HIGH-GRADE DYSPLASIA AND MALIGNANCY.   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 but stopped 09/16/20   Hypercholesteremia    Hypertension     Past Surgical History:  Procedure Laterality Date   COLONOSCOPY WITH PROPOFOL N/A 06/25/2020   Procedure: COLONOSCOPY WITH PROPOFOL;  Surgeon: Jonathon Bellows, MD;  Location: [facility] ENDOSCOPY;  Service: Endoscopy;  Laterality: N/A;   TOTAL HIP ARTHROPLASTY Left 01/11/2021   Procedure: TOTAL HIP ARTHROPLASTY ANTERIOR APPROACH;  Surgeon: Lovell Sheehan, MD;  Location: [facility] ORS;  Service: Orthopedics;  Laterality: Left;   TUBAL LIGATION      Current Outpatient Medications:    albuterol (PROVENTIL HFA;VENTOLIN HFA) 108 (90 Base) MCG/ACT inhaler, Inhale 1-2 puffs into the lungs every 6 (six) hours as needed for wheezing or shortness of breath., Disp: , Rfl:    aspirin 81 MG chewable tablet, Chew 1 tablet (81 mg total) by mouth 2 (two) times daily., Disp: 60 tablet, Rfl: 0   atorvastatin (LIPITOR) 40 MG tablet, Take 1 tablet (40 mg total) by mouth daily., Disp: 90 tablet,  Rfl: 1   Biotin 5000 MCG TABS, Take 5,000 mcg by mouth in the morning., Disp: , Rfl:    Biotin w/ Vitamins C & E (HAIR/SKIN/NAILS PO), Take 1 capsule by mouth in the morning., Disp: , Rfl:    BLACK CURRANT SEED OIL PO, Take 1 capsule by mouth in the morning., Disp: , Rfl:    blood glucose meter kit and supplies KIT, Dispense based on patient and insurance preference. Use up to four times daily as directed., Disp: 1 each, Rfl: 0   Calcium Carbonate-Vitamin D ([employer] CALCIUM 600 + D PO), Take 1 tablet by mouth in the morning. Takes 3 times per week, Disp: , Rfl:    dapagliflozin propanediol (FARXIGA) 10 MG TABS tablet, Take 1 tablet (10 mg total) by mouth daily., Disp: 90 tablet, Rfl: 3   diphenhydrAMINE (BENADRYL) 25 MG tablet, Take 25 mg by mouth daily., Disp: , Rfl:    docusate sodium (COLACE) 50 MG capsule, Take 50 mg by mouth in the morning., Disp: , Rfl:    lisinopril (ZESTRIL) 20 MG tablet, Take 20 mg by mouth daily., Disp: , Rfl:    Omega-3 Fatty Acids (FISH OIL PO), Take 2,400 mg by mouth in the morning., Disp: , Rfl:    omeprazole (PRILOSEC) 20 MG capsule, Take 1 capsule (20 mg total) by mouth daily., Disp: 90 capsule, Rfl: 1   sitaGLIPtin (JANUVIA) 100 MG tablet, Take 1 tablet (100 mg total) by mouth daily., Disp: 90 tablet, Rfl: 1  Family History  Adopted: Yes  Family history unknown: Yes     Social History   Tobacco Use   Smoking status: Every Day    Packs/day: 0.25    Years: 50.00    Pack years: 12.50    Types: Cigarettes   Smokeless tobacco: Never  Vaping Use   Vaping Use: Never used  Substance Use Topics   Alcohol use: Not Currently    Comment: as of 7/3/222   Drug use: Never    Allergies as of 03/05/2021 - Review Complete 03/05/2021  Allergen Reaction Noted   Peanut butter flavor Anaphylaxis 07/12/2017   Peanut-containing drug products Anaphylaxis 12/13/2020    Review of Systems:    All systems reviewed and negative except where noted in HPI.   Physical Exam:   BP (!) 134/91    Pulse 79    Temp 98.4 F (36.9 C) (Oral)    Ht '5\' 6"'  (1.676 m)    Wt 167 lb (75.8 kg)    BMI 26.95 kg/m  No LMP recorded. Patient is postmenopausal.  General:   Alert,  Well-developed, well-nourished, pleasant and cooperative in NAD Head:  Normocephalic and atraumatic. Eyes:  Sclera clear, no icterus.   Conjunctiva pink. Ears:  Normal auditory acuity. Nose:  No deformity, discharge, or lesions. Mouth:  No deformity or lesions,oropharynx pink & moist. Neck:  Supple; no masses or thyromegaly. Lungs:  Respirations even and unlabored.  Clear throughout to auscultation.  No wheezes, crackles, or rhonchi. No acute distress. Heart:  Regular rate and rhythm; no murmurs, clicks, rubs, or gallops. Abdomen:  Normal bowel sounds. Soft, non-tender and non-distended without masses, hepatosplenomegaly or hernias noted.  No guarding or rebound tenderness.   Rectal: Not performed Msk:  Symmetrical without gross deformities. Good, equal movement & strength bilaterally. Pulses:  Normal pulses noted. Extremities:  No clubbing or edema.  No cyanosis. Neurologic:  Alert and oriented x3;  grossly normal neurologically. Skin:  Intact without significant lesions or rashes. No jaundice. Lymph Nodes:  No significant cervical adenopathy. Psych:  Alert and cooperative. Normal mood and affect.  Imaging Studies: Reviewed  Assessment and Plan:   LAKIRA OGANDO is a 65 y.o. pleasant African-American female with metabolic syndrome, uncontrolled diabetes, fatty liver and probable cirrhosis, liver lesion  Elevated LFTs with history of fatty liver and probable cirrhosis, history of alcohol use, quit drinking alcohol in July 2022 Hepatitis B and C serologies are negative Recommend rest of the secondary liver disease work-up given significantly elevated alkaline phosphatase levels, rule out PBC No evidence of portal hypertension based on imaging and laboratory data Recommend EGD for variceal  screening as well as history of iron deficiency anemia.  If EGD is unremarkable, recommend video capsule endoscopy Appears euvolemic No evidence of PSE HCC screening: Liver lesion on the ultrasound, recommend triple phase CT as patient is claustrophobic for MRI and she also had left hip replacement Discussed about tight control of diabetes   Follow up in 4 months   Megan Darby, MD

## 2021-03-05 NOTE — Addendum Note (Signed)
Addended by: [name] on: 03/05/2021 04:01 PM   Modules accepted: Orders

## 2021-03-05 NOTE — Addendum Note (Signed)
Addended by: Lurlean Nanny on: 03/05/2021 12:44 PM   Modules accepted: Orders

## 2021-03-05 NOTE — Addendum Note (Signed)
Addended by: Lurlean Nanny on: 03/05/2021 11:44 AM   Modules accepted: Orders

## 2021-03-05 NOTE — Patient Instructions (Addendum)
I will call you later with the appointment day and time for the CT scan  If you need to cancel or reschedule, you can call [phone]

## 2021-03-06 ENCOUNTER — Telehealth: Payer: Self-pay

## 2021-03-06 LAB — VITAMIN D 25 HYDROXY (VIT D DEFICIENCY, FRACTURES): Vit D, 25-Hydroxy: 32.1 ng/mL (ref 30.0–100.0)

## 2021-03-06 LAB — ANTI-SMOOTH MUSCLE ANTIBODY, IGG: Smooth Muscle Ab: 32 Units — ABNORMAL HIGH (ref 0–19)

## 2021-03-06 LAB — IRON,TIBC AND FERRITIN PANEL
Ferritin: 27 ng/mL (ref 15–150)
Iron Saturation: 7 % — CL (ref 15–55)
Iron: 34 ug/dL (ref 27–139)
Total Iron Binding Capacity: 486 ug/dL — ABNORMAL HIGH (ref 250–450)
UIBC: 452 ug/dL — ABNORMAL HIGH (ref 118–369)

## 2021-03-06 LAB — ANTI-MICROSOMAL ANTIBODY LIVER / KIDNEY: LKM1 Ab: 1.2 Units (ref 0.0–20.0)

## 2021-03-06 LAB — B12 AND FOLATE PANEL
Folate: 7.5 ng/mL (ref >3.0)
Vitamin B-12: >2000 pg/mL — ABNORMAL HIGH (ref 232–1245)

## 2021-03-06 LAB — ANA: Anti Nuclear Antibody (ANA): NEGATIVE

## 2021-03-06 LAB — AFP TUMOR MARKER: AFP, Serum, Tumor Marker: 5.2 ng/mL (ref 0.0–9.2)

## 2021-03-06 LAB — ALPHA-1-ANTITRYPSIN: A-1 Antitrypsin: 190 mg/dL — ABNORMAL HIGH (ref 101–187)

## 2021-03-06 LAB — CERULOPLASMIN: Ceruloplasmin: 36.8 mg/dL (ref 19.0–39.0)

## 2021-03-06 LAB — MITOCHONDRIAL ANTIBODIES: Mitochondrial Ab: <20 Units (ref 0.0–20.0)

## 2021-03-06 MED ORDER — FUSION PLUS PO CAPS: 1.0000 | ORAL_CAPSULE | Freq: Every day | ORAL | 0 refills | Status: DC

## 2021-03-06 MED ORDER — FUSION PLUS PO CAPS: 1.0000 | ORAL_CAPSULE | Freq: Every day | ORAL | 2 refills | Status: DC

## 2021-03-06 NOTE — Telephone Encounter (Signed)
Called patient she understands her results anfd will get by as soon as she can to pick up the fusion samples from the front desk

## 2021-03-06 NOTE — Telephone Encounter (Signed)
-----   Message from Lin Landsman, MD sent at 03/06/2021 10:09 AM EST ----- Please inform patient that she has severe iron deficiency.  Recommend treating with iron replacement, she can pick up fusion plus samples and take once daily and please send in a prescription for 1 month with 2 refills  RV

## 2021-03-08 LAB — BASIC METABOLIC PANEL
BUN/Creatinine Ratio: 17 (ref 12–28)
BUN: 13 mg/dL (ref 8–27)
CO2: 23 mmol/L (ref 20–29)
Calcium: 12.1 mg/dL — ABNORMAL HIGH (ref 8.7–10.3)
Chloride: 99 mmol/L (ref 96–106)
Creatinine, Ser: 0.75 mg/dL (ref 0.57–1.00)
Glucose: 114 mg/dL — ABNORMAL HIGH (ref 70–99)
Potassium: 4.6 mmol/L (ref 3.5–5.2)
Sodium: 135 mmol/L (ref 134–144)
eGFR: 88 mL/min/{1.73_m2} (ref >59)

## 2021-03-08 LAB — SPECIMEN STATUS REPORT

## 2021-03-13 ENCOUNTER — Other Ambulatory Visit: Payer: Medicare Other

## 2021-03-16 DIAGNOSIS — E782 Mixed hyperlipidemia: Secondary | ICD-10-CM

## 2021-03-16 DIAGNOSIS — E119 Type 2 diabetes mellitus without complications: Secondary | ICD-10-CM | POA: Diagnosis not present

## 2021-03-16 DIAGNOSIS — I10 Essential (primary) hypertension: Secondary | ICD-10-CM | POA: Diagnosis not present

## 2021-03-20 ENCOUNTER — Telehealth: Payer: Self-pay

## 2021-03-20 NOTE — Telephone Encounter (Signed)
Patient left a voicemail and stated she had some questions about her EGD that is schedule on 03/27/2021. Called and left a message for call back and sent patient a mychart message

## 2021-03-21 ENCOUNTER — Telehealth: Payer: Self-pay

## 2021-03-21 NOTE — Progress Notes (Signed)
Chronic Care Management Pharmacy Assistant   Name: ARMANII URBANIK  MRN: [id number] DOB: [birth year]  Reason for Encounter: CCM (Diabetes Disease State)   Recent office visits:  None since last CCM contact  Recent consult visits:  03/06/2021 - Gastroenterology - Telephone - Due to sever iron deficiency start: Iron-FA-B Cmp-C-Biot-Probiotic (FUSION PLUS) CAPS.  03/05/2021 - Gastroenterology - Patient presented for cirrhosis of liver. Labs: Iron, TIBC, Ferritin Panel and Alpha-1-antitrypsin, Ant-smooth muscle, B12 and BMP. Order: CT Liver Abdomen wo contrast. Start: diazepam (VALIUM) 5 MG tablet. Stop due to completed course: HYDROcodone-acetaminophen (NORCO) 7.5-325 MG tablet.   Hospital visits:  None since last CCM contact  Medications: Outpatient Encounter Medications as of 03/21/2021  Medication Sig   albuterol (PROVENTIL HFA;VENTOLIN HFA) 108 (90 Base) MCG/ACT inhaler Inhale 1-2 puffs into the lungs every 6 (six) hours as needed for wheezing or shortness of breath.   aspirin 81 MG chewable tablet Chew 1 tablet (81 mg total) by mouth 2 (two) times daily.   atorvastatin (LIPITOR) 40 MG tablet Take 1 tablet (40 mg total) by mouth daily.   Biotin 5000 MCG TABS Take 5,000 mcg by mouth in the morning.   Biotin w/ Vitamins C & E (HAIR/SKIN/NAILS PO) Take 1 capsule by mouth in the morning.   BLACK CURRANT SEED OIL PO Take 1 capsule by mouth in the morning.   blood glucose meter kit and supplies KIT Dispense based on patient and insurance preference. Use up to four times daily as directed.   Calcium Carbonate-Vitamin D (CVS CALCIUM 600 + D PO) Take 1 tablet by mouth in the morning. Takes 3 times per week   dapagliflozin propanediol (FARXIGA) 10 MG TABS tablet Take 1 tablet (10 mg total) by mouth daily.   diazepam (VALIUM) 5 MG tablet Take 1 tablet 1 hour prior to procedure, Take 1 tablet right before procedure if needed   diphenhydrAMINE (BENADRYL) 25 MG tablet Take 25 mg by mouth daily.    docusate sodium (COLACE) 50 MG capsule Take 50 mg by mouth in the morning.   Iron-FA-B Cmp-C-Biot-Probiotic (FUSION PLUS) CAPS Take 1 capsule by mouth daily.   lisinopril (ZESTRIL) 20 MG tablet Take 20 mg by mouth daily.   Omega-3 Fatty Acids (FISH OIL PO) Take 2,400 mg by mouth in the morning.   omeprazole (PRILOSEC) 20 MG capsule Take 1 capsule (20 mg total) by mouth daily.   sitaGLIPtin (JANUVIA) 100 MG tablet Take 1 tablet (100 mg total) by mouth daily.   No facility-administered encounter medications on file as of 03/21/2021.   Recent Relevant Labs: Lab Results  Component Value Date/Time   HGBA1C 9.6 (A) 10/24/2020 09:20 AM   HGBA1C 5.8 (A) 06/18/2020 10:13 AM   HGBA1C 5.8 12/12/2019 12:00 AM   MICROALBUR Negative 12/12/2019 12:00 AM    Kidney Function Lab Results  Component Value Date/Time   CREATININE 0.75 03/05/2021 09:25 AM   CREATININE 0.76 01/12/2021 04:25 AM   GFR 67.66 12/05/2020 09:11 AM   GFRNONAA >60 01/12/2021 04:25 AM   Contacted patient on 03/21/2021 to discuss diabetes disease state.   Current antihyperglycemic regimen:  Januvia 100 mg - 1 tablet daily (started August 2022) Farxiga 10 mg - 1 tablet daily   Patient verbally confirms she is taking the above medications as directed. Yes  What diet changes have been made to improve diabetes control? Patient states the medicine has been controlling her diabetes. She has not been watching what she eats.   What recent  interventions/DTPs have been made to improve glycemic control:  No recent interventions  Have there been any recent hospitalizations or ED visits since last visit with CPP? No  Patient denies hypoglycemic symptoms, including Pale, Sweaty, Shaky, Hungry, Nervous/irritable, and Vision changes  Patient denies hyperglycemic symptoms, including blurry vision, excessive thirst, fatigue, polyuria, and weakness  How often are you checking your blood sugar? Patient has not been checking her blood sugar as  her machine is broken.   Are you checking your feet daily/regularly? Yes  Adherence Review: Is the patient currently on a STATIN medication? Yes Is the patient currently on ACE/ARB medication? Yes Does the patient have >5 day gap between last estimated fill dates? No  Care Gaps: Annual wellness visit in last year? No Most recent A1C reading: 9.6 on 10/24/2020 Most Recent BP reading: 134/91 on 03/05/2021   Last eye exam / retinopathy screening: Up to date Last diabetic foot exam: Up to date  Star Rating Drugs:  Medication:  Last Fill: Day Supply Farxiga 10 mg  02/21/2021 30   Januvia 100 mg 02/01/2021 90 Atorvastatin 40 mg 02/01/2021 90 Lisinopril 20 mg 02/21/2021 30  CT Liver appointment on 03/26/2021  Charlene Brooke, CPP notified  [name], [place] [phone]  Time Spent:  30 Minutes

## 2021-03-22 NOTE — Progress Notes (Signed)
Called patient to inquire what was wrong with her blood glucose meter. Patient states she does not need a new one. She is having difficulty changing the needle. Patient will have her daughter look at it and see if she can help her. Patient will then start taking her blood sugar again.   Patient would like to schedule an appointment with Dr. Einar Pheasant in January if possible. Patient would like to come in for a diabetes follow up. Patient is available anytime other than the 23rd of January. Patient needs three days advance notice so she can arrange transportation.   Charlene Brooke, CPP notified  [name], [place] Clinical Pharmacy Assistant [phone]  Time Spent: 10 Minutes

## 2021-03-26 ENCOUNTER — Ambulatory Visit
Admission: RE | Admit: 2021-03-26 | Discharge: 2021-03-26 | Disposition: A | Discharge status: Home / Self Care | Payer: Medicare Other | Source: Ambulatory Visit | Attending: Gastroenterology | Admitting: Gastroenterology

## 2021-03-26 ENCOUNTER — Encounter: Payer: Self-pay | Admitting: Gastroenterology

## 2021-03-26 DIAGNOSIS — K769 Liver disease, unspecified: Secondary | ICD-10-CM | POA: Diagnosis present

## 2021-03-26 DIAGNOSIS — K7460 Unspecified cirrhosis of liver: Secondary | ICD-10-CM | POA: Insufficient documentation

## 2021-03-26 IMAGING — CT CT ABDOMEN WO/W CM
3 of 15 series · 11 of 46 positions shown, 17 images · IV contrast (agent unspecified)
Comparison: Ultrasound on 09/24/2020

CLINICAL DATA: Hepatic cirrhosis.  Liver lesion seen on ultrasound.

EXAM:
CT ABDOMEN WITHOUT AND WITH CONTRAST
TECHNIQUE: Multidetector CT imaging of the abdomen was performed following the
standard protocol before and following the bolus administration of
intravenous contrast.

[Series 7: axial arterial · axial · arterial · 0.67mm/px · z∈[+508,+664]mm · 5 of 80 slices shown]
[im 14/80  soft-tissue]
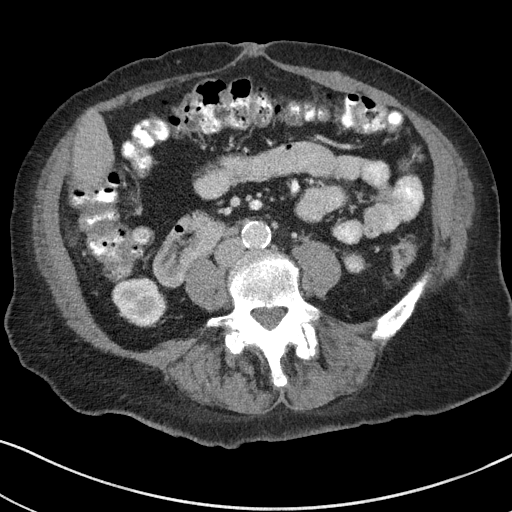
[im 27/80  soft-tissue]
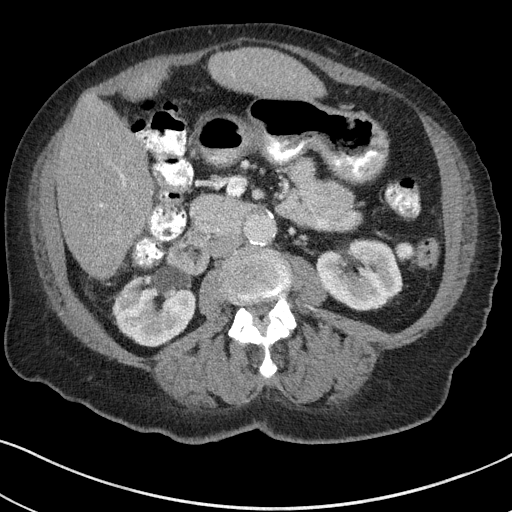
[im 40/80  soft-tissue]
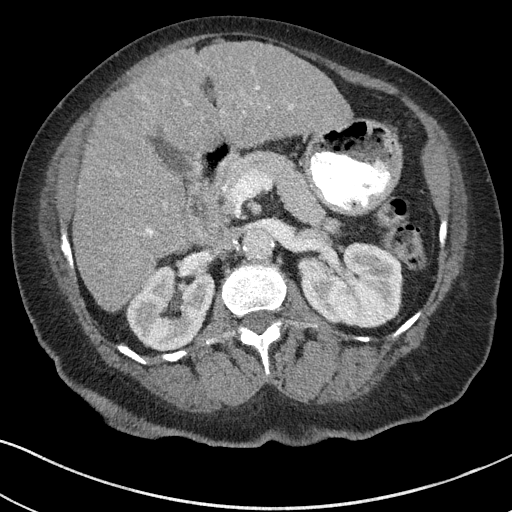
[im 53/80  soft-tissue]
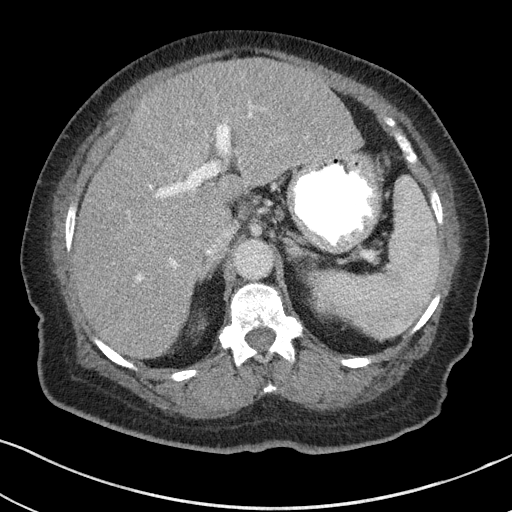
[im 66/80  soft-tissue]
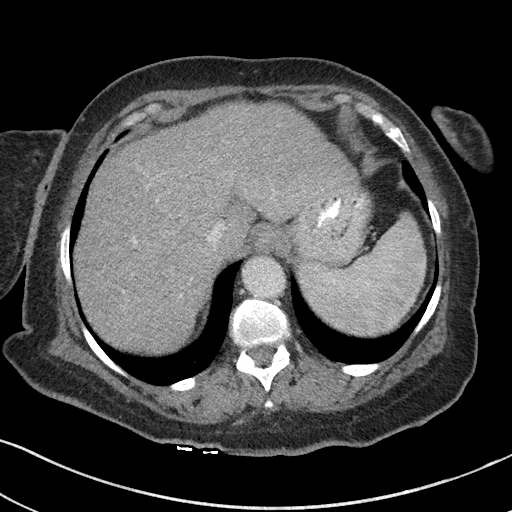

[Series 12: axial venous · axial · portal-venous · 0.67mm/px · z∈[+514,+673]mm · 5 of 81 slices shown, 10 images]
[im 14/81  soft-tissue]
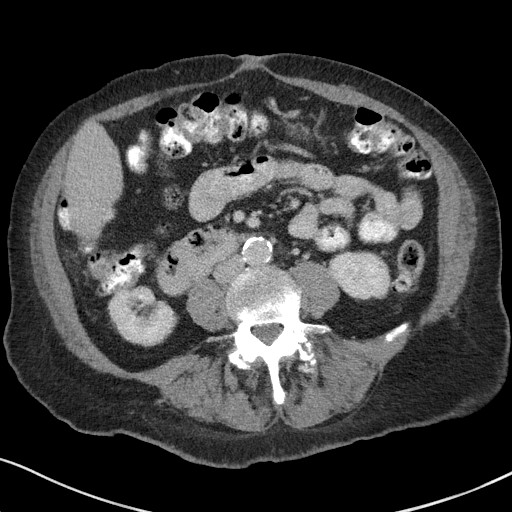
[im 14/81  bone]
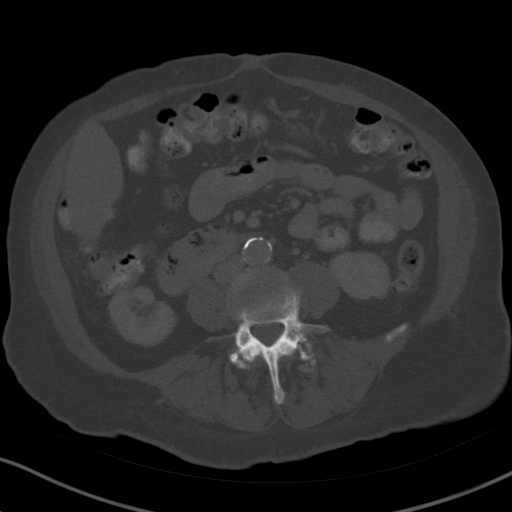
[im 27/81  soft-tissue]
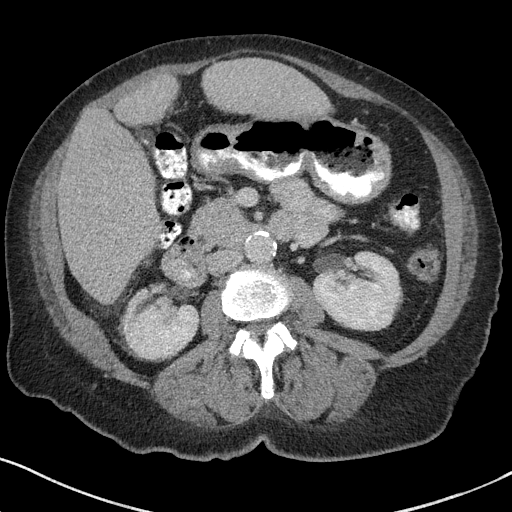
[im 27/81  lung]
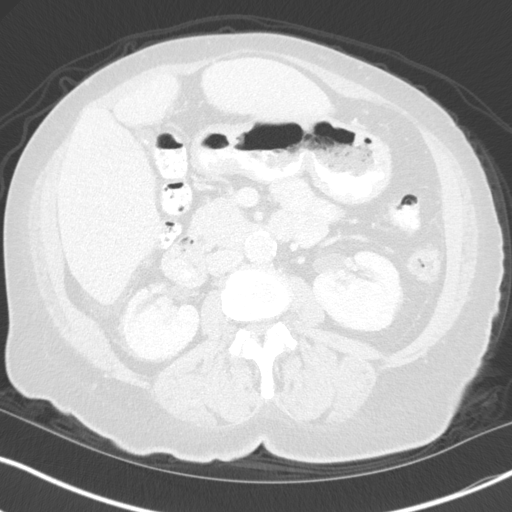
[im 41/81  soft-tissue]
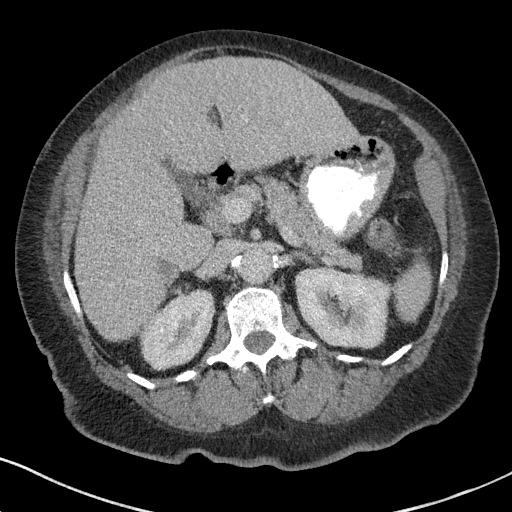
[im 41/81  lung]
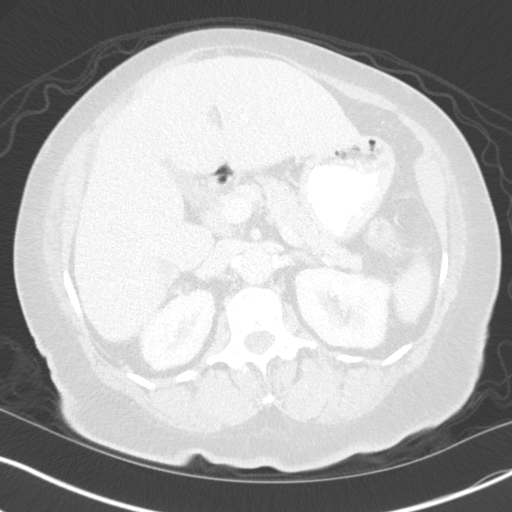
[im 54/81  soft-tissue]
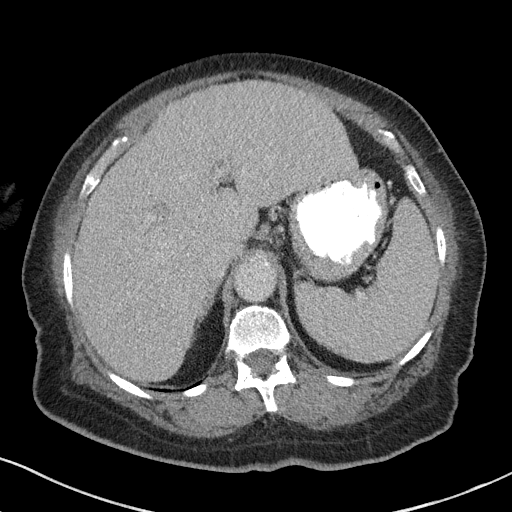
[im 54/81  lung]
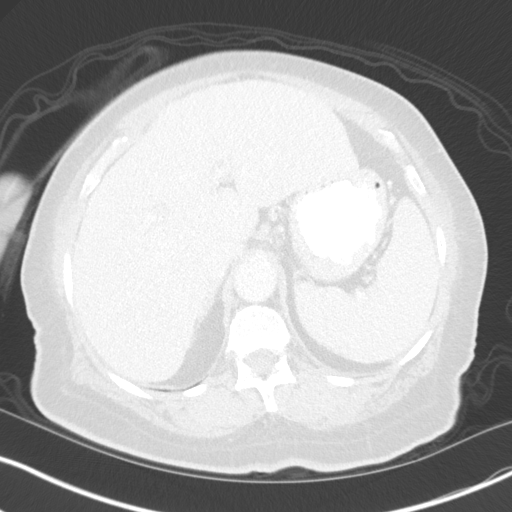
[im 67/81  soft-tissue]
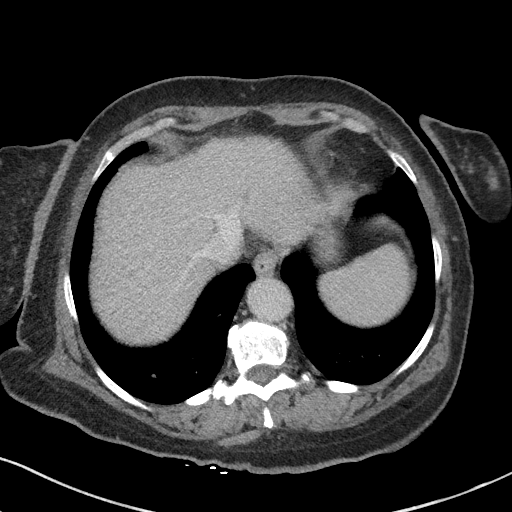
[im 67/81  lung]
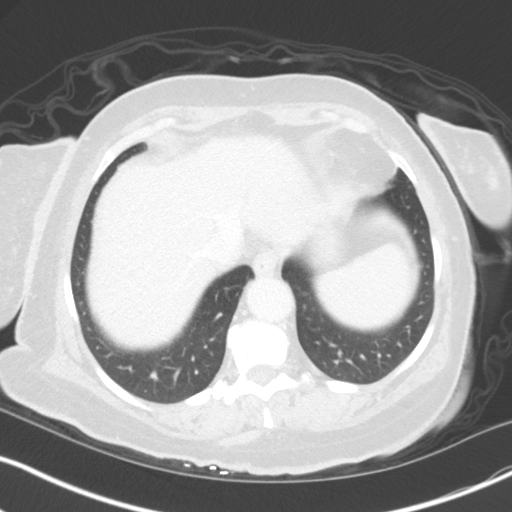

[Series 15: coronal venous · coronal · portal-venous · 0.55mm/px · 1 of 101 slices shown, 2 images]
[im 51/101  soft-tissue]
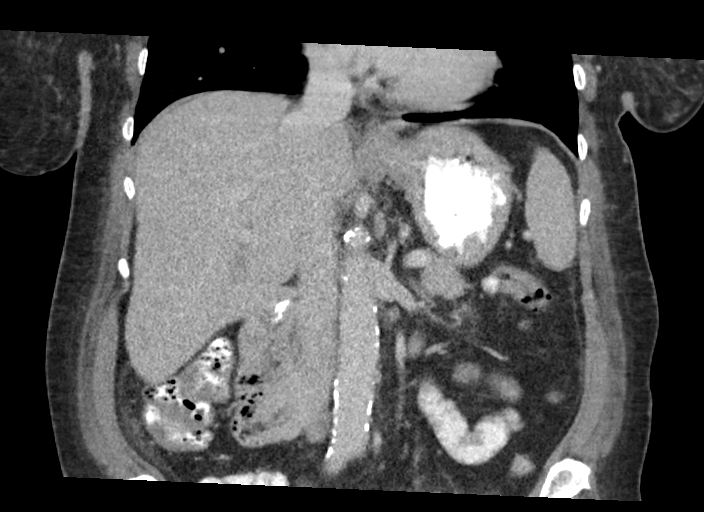
[im 51/101  bone]
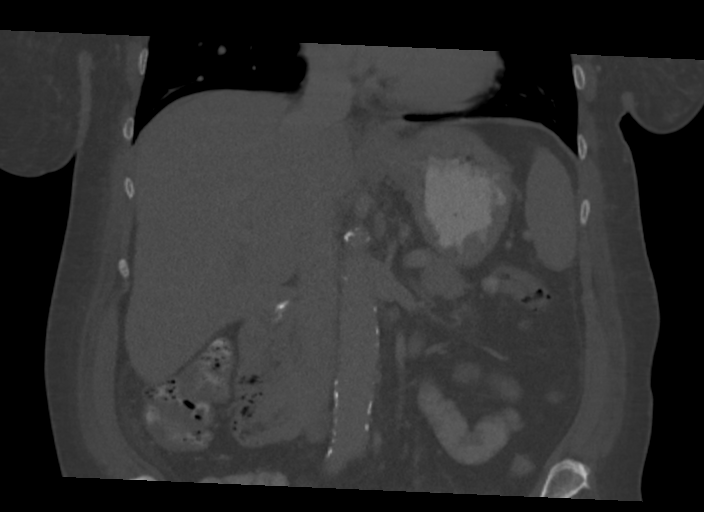

[11 of 46 positions shown; findings below may reference images not displayed]

RADIATION DOSE REDUCTION: This exam was performed according to the
departmental dose-optimization program which includes automated
exposure control, adjustment of the mA and/or kV according to
patient size and/or use of iterative reconstruction technique.

CONTRAST:  100mL OMNIPAQUE IOHEXOL 300 MG/ML  SOLN
FINDINGS: Lower chest: No acute findings.

Hepatobiliary: Hepatic cirrhosis is demonstrated. A single
hypovascular mass is seen in segment 6 of the right lobe which
measures 1.9 x 1.6 cm (image 36/7). This has nonspecific
characteristics, but does not show hypervascularity seen with
hepatocellular carcinoma. No other liver lesions identified. Several
tiny sub-cm gallstones are noted, however there is no evidence of
cholecystitis or biliary ductal dilatation.

Pancreas:  No mass or inflammatory changes.

Spleen:  Within normal limits in size and appearance.

Adrenals/Urinary Tract: No masses identified. Mild right renal
parenchymal scarring noted. Sub-cm cyst seen in the posterior
interpolar region of the left kidney. No evidence of hydronephrosis.

Stomach/Bowel: Left-sided colonic diverticulosis is noted, however
there is no evidence of diverticulitis in this region.

Vascular/Lymphatic: No pathologically enlarged lymph nodes
identified. No acute vascular findings. Aortic atherosclerotic
calcification noted.

Other:  None.

Musculoskeletal:  No suspicious bone lesions identified.
IMPRESSION: Hepatic cirrhosis.

1.9 cm hypovascular mass in right hepatic lobe. This has nonspecific
characteristics, but does not show hypervascularity seen with
hepatocellular carcinoma. Recommend abdomen MRI without and with
contrast for further characterization.

Cholelithiasis. No radiographic evidence of cholecystitis.

Colonic diverticulosis. No radiographic evidence of diverticulitis.

Aortic Atherosclerosis (2K6IB-V06.6).

## 2021-03-26 MED ORDER — IOHEXOL 300 MG/ML  SOLN
100.0000 mL | Freq: Once | INTRAMUSCULAR | Status: AC | PRN
  Administered 2021-03-26: 100 mL via INTRAVENOUS

## 2021-03-27 ENCOUNTER — Ambulatory Visit
Admission: RE | Admit: 2021-03-27 | Discharge: 2021-03-27 | Disposition: A | Discharge status: Home / Self Care | Payer: Medicare Other | Source: Ambulatory Visit | Attending: Gastroenterology | Admitting: Gastroenterology

## 2021-03-27 ENCOUNTER — Ambulatory Visit: Payer: Medicare Other | Admitting: Anesthesiology

## 2021-03-27 ENCOUNTER — Encounter: Payer: Self-pay | Admitting: Gastroenterology

## 2021-03-27 ENCOUNTER — Encounter
Admission: RE | Disposition: A | Discharge status: Home / Self Care | Payer: Self-pay | Source: Ambulatory Visit | Attending: Gastroenterology

## 2021-03-27 DIAGNOSIS — K269 Duodenal ulcer, unspecified as acute or chronic, without hemorrhage or perforation: Secondary | ICD-10-CM | POA: Insufficient documentation

## 2021-03-27 DIAGNOSIS — K219 Gastro-esophageal reflux disease without esophagitis: Secondary | ICD-10-CM | POA: Diagnosis not present

## 2021-03-27 DIAGNOSIS — K7460 Unspecified cirrhosis of liver: Secondary | ICD-10-CM | POA: Diagnosis not present

## 2021-03-27 DIAGNOSIS — E119 Type 2 diabetes mellitus without complications: Secondary | ICD-10-CM | POA: Diagnosis not present

## 2021-03-27 DIAGNOSIS — D509 Iron deficiency anemia, unspecified: Secondary | ICD-10-CM | POA: Diagnosis not present

## 2021-03-27 DIAGNOSIS — K2950 Unspecified chronic gastritis without bleeding: Secondary | ICD-10-CM | POA: Diagnosis not present

## 2021-03-27 HISTORY — PX: ESOPHAGOGASTRODUODENOSCOPY (EGD) WITH PROPOFOL: SHX5813

## 2021-03-27 LAB — GLUCOSE, CAPILLARY: Glucose-Capillary: 121 mg/dL — ABNORMAL HIGH (ref 70–99)

## 2021-03-27 SURGERY — ESOPHAGOGASTRODUODENOSCOPY (EGD) WITH PROPOFOL
Anesthesia: General

## 2021-03-27 MED ORDER — PROPOFOL 10 MG/ML IV BOLUS
INTRAVENOUS | Status: DC | PRN
  Administered 2021-03-27: 30 mg via INTRAVENOUS
  Administered 2021-03-27: 20 mg via INTRAVENOUS
  Administered 2021-03-27: 100 mg via INTRAVENOUS

## 2021-03-27 MED ORDER — LIDOCAINE HCL (PF) 2 % IJ SOLN
INTRAMUSCULAR | Status: AC
  Filled 2021-03-27: qty 5

## 2021-03-27 MED ORDER — SODIUM CHLORIDE 0.9 % IV SOLN: INTRAVENOUS | Status: DC

## 2021-03-27 MED ORDER — LIDOCAINE HCL (CARDIAC) PF 100 MG/5ML IV SOSY
PREFILLED_SYRINGE | INTRAVENOUS | Status: DC | PRN
  Administered 2021-03-27: 10 mg via INTRAVENOUS

## 2021-03-27 NOTE — Anesthesia Postprocedure Evaluation (Signed)
Anesthesia Post Note  Patient: Megan [name]  Procedure(s) Performed: ESOPHAGOGASTRODUODENOSCOPY (EGD) WITH PROPOFOL  Patient location during evaluation: PACU Anesthesia Type: General Level of consciousness: awake and alert, oriented and patient cooperative Pain management: pain level controlled Vital Signs Assessment: post-procedure vital signs reviewed and stable Respiratory status: spontaneous breathing, nonlabored ventilation and respiratory function stable Cardiovascular status: blood pressure returned to baseline and stable Postop Assessment: adequate PO intake Anesthetic complications: no   No notable events documented.   Last Vitals:  Vitals:   03/27/21 1030 03/27/21 1040  BP: (!) 142/87 (!) 169/83  Pulse: 86 74  Resp: 19 (!) 24  Temp:    SpO2: 100% 100%    Last Pain:  Vitals:   03/27/21 1020  TempSrc: Temporal  PainSc:                  Darrin Nipper

## 2021-03-27 NOTE — Anesthesia Preprocedure Evaluation (Signed)
Anesthesia Evaluation  Patient identified by MRN, date of birth, ID band Patient awake    Reviewed: Allergy & Precautions, NPO status , Patient's Chart, lab work & pertinent test results  History of Anesthesia Complications Negative for: history of anesthetic complications  Airway Mallampati: I   Neck ROM: Full    Dental   8 teeth remaining, some chipped:   Pulmonary asthma , Current Smoker (1/3 ppd)Patient did not abstain from smoking.,  Chronic bronchitis   Pulmonary exam normal breath sounds clear to auscultation       Cardiovascular hypertension, Normal cardiovascular exam Rhythm:Regular Rate:Normal     Neuro/Psych Hx alcohol use disorder, quit since 09/16/20    GI/Hepatic GERD  ,  Endo/Other  diabetes, Type 2  Renal/GU negative Renal ROS     Musculoskeletal  (+) Arthritis ,   Abdominal   Peds  Hematology negative hematology ROS (+)   Anesthesia Other Findings   Reproductive/Obstetrics                             Anesthesia Physical Anesthesia Plan  ASA: 2  Anesthesia Plan: General   Post-op Pain Management:    Induction: Intravenous  PONV Risk Score and Plan: 2 and Propofol infusion, TIVA and Treatment may vary due to age or medical condition  Airway Management Planned: Natural Airway  Additional Equipment:   Intra-op Plan:   Post-operative Plan:   Informed Consent: I have reviewed the patients History and Physical, chart, labs and discussed the procedure including the risks, benefits and alternatives for the proposed anesthesia with the patient or authorized representative who has indicated his/her understanding and acceptance.       Plan Discussed with: CRNA  Anesthesia Plan Comments: (LMA/GETA backup discussed.  Patient consented for risks of anesthesia including but not limited to:  - adverse reactions to medications - damage to eyes, teeth, lips or other  oral mucosa - nerve damage due to positioning  - sore throat or hoarseness - damage to heart, brain, nerves, lungs, other parts of body or loss of life  Informed patient about role of CRNA in peri- and intra-operative care.  Patient voiced understanding.)        Anesthesia Quick Evaluation

## 2021-03-27 NOTE — Transfer of Care (Signed)
Immediate Anesthesia Transfer of Care Note  Patient: Megan [name]  Procedure(s) Performed: ESOPHAGOGASTRODUODENOSCOPY (EGD) WITH PROPOFOL  Patient Location: PACU and Endoscopy Unit  Anesthesia Type:General  Level of Consciousness: drowsy and patient cooperative  Airway & Oxygen Therapy: Patient Spontanous Breathing  Post-op Assessment: Report given to RN and Post -op Vital signs reviewed and stable  Post vital signs: Reviewed and stable  Last Vitals:  Vitals Value Taken Time  BP 138/75 03/27/21 1020  Temp    Pulse 86 03/27/21 1021  Resp 36 03/27/21 1021  SpO2 100 % 03/27/21 1021  Vitals shown include unvalidated device data.  Last Pain:  Vitals:   03/27/21 0848  TempSrc: Temporal  PainSc: 2          Complications: No notable events documented.

## 2021-03-27 NOTE — Op Note (Signed)
[facility] Gastroenterology Patient Name: Megan [name] Procedure Date: 03/27/2021 10:01 AM MRN: [id number] Account #: [id number] Date of Birth: 09-24-1955 Admit Type: Outpatient Age: 66 Room: [facility] ENDO ROOM 4 Gender: Female Note Status: Finalized Instrument Name: Altamese Cabal Endoscope [id number] Procedure:             Upper GI endoscopy Indications:           Unexplained iron deficiency anemia, Cirrhosis rule out                         esophageal varices Providers:             Lin Landsman MD, MD Referring MD:          Jobe Marker. Einar Pheasant (Referring MD) Medicines:             General Anesthesia Complications:         No immediate complications. Estimated blood loss: None. Procedure:             Pre-Anesthesia Assessment:                        - Prior to the procedure, a History and Physical was                         performed, and patient medications and allergies were                         reviewed. The patient is competent. The risks and                         benefits of the procedure and the sedation options and                         risks were discussed with the patient. All questions                         were answered and informed consent was obtained.                         Patient identification and proposed procedure were                         verified by the physician, the nurse, the                         anesthesiologist, the anesthetist and the technician                         in the pre-procedure area in the procedure room in the                         endoscopy suite. Mental Status Examination: alert and                         oriented. Airway Examination: normal oropharyngeal                         airway and neck mobility. Respiratory Examination:  clear to auscultation. CV Examination: normal.                         Prophylactic Antibiotics: The patient does not require                          prophylactic antibiotics. Prior Anticoagulants: The                         patient has taken no previous anticoagulant or                         antiplatelet agents. ASA Grade Assessment: II - A                         patient with mild systemic disease. After reviewing                         the risks and benefits, the patient was deemed in                         satisfactory condition to undergo the procedure. The                         anesthesia plan was to use general anesthesia.                         Immediately prior to administration of medications,                         the patient was re-assessed for adequacy to receive                         sedatives. The heart rate, respiratory rate, oxygen                         saturations, blood pressure, adequacy of pulmonary                         ventilation, and response to care were monitored                         throughout the procedure. The physical status of the                         patient was re-assessed after the procedure.                        After obtaining informed consent, the endoscope was                         passed under direct vision. Throughout the procedure,                         the patient's blood pressure, pulse, and oxygen                         saturations were monitored continuously. The Endoscope  was introduced through the mouth, and advanced to the                         second part of duodenum. The upper GI endoscopy was                         accomplished without difficulty. The patient tolerated                         the procedure well. Findings:      One non-bleeding superficial duodenal ulcer with a clean ulcer base       (Forrest Class III) was found in the duodenal bulb. The lesion was 3 mm       in largest dimension.      The second portion of the duodenum was normal.      A few dispersed small erosions with stigmata of recent bleeding were        found in the gastric body. Biopsies were taken with a cold forceps for       Helicobacter pylori testing.      The incisura and gastric antrum were normal. Biopsies were taken with a       cold forceps for Helicobacter pylori testing.      Esophagogastric landmarks were identified: the gastroesophageal junction       was found at 39 cm from the incisors.      The gastroesophageal junction and examined esophagus were normal. Impression:            - Non-bleeding duodenal ulcer with a clean ulcer base                         (Forrest Class III).                        - Normal second portion of the duodenum.                        - Erosive gastropathy with stigmata of recent                         bleeding. Biopsied.                        - Normal incisura and antrum. Biopsied.                        - Esophagogastric landmarks identified.                        - Normal gastroesophageal junction and esophagus. Recommendation:        - Discharge patient to home (with escort).                        - Resume previous diet today.                        - Continue present medications.                        - Await pathology results.                        -  Return to my office as previously scheduled. Procedure Code(s):     --- Professional ---                        [phone], Esophagogastroduodenoscopy, flexible,                         transoral; with biopsy, single or multiple Diagnosis Code(s):     --- Professional ---                        K26.9, Duodenal ulcer, unspecified as acute or                         chronic, without hemorrhage or perforation                        K92.2, Gastrointestinal hemorrhage, unspecified                        K74.60, Unspecified cirrhosis of liver                        D50.9, Iron deficiency anemia, unspecified CPT copyright 2019 American Medical Association. All rights reserved. The codes documented in this report are preliminary and upon coder  review may  be revised to meet current compliance requirements. Dr. Ulyess Mort Lin Landsman MD, MD 03/27/2021 10:19:51 AM This report has been signed electronically. Number of Addenda: 0 Note Initiated On: 03/27/2021 10:01 AM Estimated Blood Loss:  Estimated blood loss: none.      [facility]

## 2021-03-27 NOTE — H&P (Signed)
[name], MD [address]  [address]  [place], [place] [address]  Main: [phone]  Fax: [phone] Pager: [phone]  Primary Care Physician:  Lesleigh Noe, MD Primary Gastroenterologist:  Dr. [name]  Pre-Procedure History & Physical: HPI:  Megan [name] is a 66 y.o. female is here for an endoscopy.   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 Authorizing Provider  atorvastatin (LIPITOR) 40 MG tablet Take 1 tablet (40 mg total) by mouth daily. 02/27/20  Yes Elby Beck, FNP  dapagliflozin propanediol (FARXIGA) 10 MG TABS tablet Take 1 tablet (10 mg total) by mouth daily. 12/05/20  Yes Lesleigh Noe, MD  diazepam (VALIUM) 5 MG tablet Take 1 tablet 1 hour prior to procedure, Take 1 tablet right before procedure if needed 03/05/21  Yes Kaylon Laroche, Tally Due, MD  diphenhydrAMINE (BENADRYL) 25 MG tablet Take 25 mg by mouth daily.   Yes [provider]  lisinopril (ZESTRIL) 20 MG tablet Take 20 mg by mouth daily. 11/02/19  Yes [provider]  sitaGLIPtin (JANUVIA) 100 MG tablet Take 1 tablet (100 mg total) by mouth daily. 10/24/20  Yes Lesleigh Noe, MD  albuterol (PROVENTIL HFA;VENTOLIN HFA) 108 (90 Base) MCG/ACT inhaler Inhale 1-2 puffs into the lungs every 6 (six) hours as needed for wheezing or shortness of breath.    [provider]  aspirin 81 MG chewable tablet Chew 1 tablet (81 mg total) by mouth 2 (two) times daily. 01/12/21   Carlynn Spry, PA-C  Biotin 5000 MCG TABS Take 5,000 mcg by mouth in the morning.    [provider]  Biotin w/ Vitamins C & E (HAIR/SKIN/NAILS PO) Take 1 capsule by mouth in the morning.    [provider]  BLACK CURRANT SEED OIL PO Take 1 capsule by mouth in the morning.    [provider]  blood glucose meter kit and supplies KIT Dispense based on patient and insurance preference. Use up to four times daily as directed. 12/05/20   Lesleigh Noe, MD  Calcium Carbonate-Vitamin D ([employer] CALCIUM 600 + D PO) Take 1 tablet by mouth in the morning. Takes 3 times per week    [provider]  docusate sodium (COLACE) 50 MG capsule Take 50 mg by mouth in the morning.    [provider]  Iron-FA-B Cmp-C-Biot-Probiotic (FUSION PLUS) CAPS Take 1 capsule by mouth daily. 03/06/21   Lin Landsman, MD  Omega-3 Fatty Acids (FISH OIL PO) Take 2,400 mg by mouth in the morning.    [provider]  omeprazole (PRILOSEC) 20 MG capsule Take 1  capsule (20 mg total) by mouth daily. 08/07/20   Lesleigh Noe, MD    Allergies as of 03/05/2021 - Review Complete 03/05/2021  Allergen Reaction Noted   Peanut butter flavor Anaphylaxis 07/12/2017   Peanut-containing drug products Anaphylaxis 12/13/2020    Family History  Adopted: Yes  Family history unknown: Yes    Social History   Socioeconomic History   Marital status: Married    Spouse name: Not on file   Number of children: Not on file   Years of education: Not on file   Highest education level: Not on file  Occupational History   Not on file  Tobacco Use   Smoking status: Every Day     Packs/day: 0.25    Years: 50.00    Pack years: 12.50    Types: Cigarettes   Smokeless tobacco: Never  Vaping Use   Vaping Use: Never used  Substance and Sexual Activity   Alcohol use: Not Currently    Comment: as of 7/3/222   Drug use: Never   Sexual activity: Not Currently  Other Topics Concern   Not on file  Social History Narrative   05/17/20   From: [place] originally, moved in 2006   Living: with husband (marital stress) and adult daughter   Work: retired Patent attorney      Family: 2 children - Amie [place] (lives with her) and [place] ([place]) - 2 grandchildren - Academic librarian (lives with him) and Vladimir Creeks       Enjoys: playing games on phone      Exercise: not currently   Diet: low appetite, alcohol       Safety   Seat belts: Yes    Guns: No   Safe in relationships: does not feel safe with husband - has asked him to leave   Social Determinants of Radio broadcast assistant Strain: Low Risk    Difficulty of Paying Living Expenses: Not very hard  Food Insecurity: Not on file  Transportation Needs: Not on file  Physical Activity: Not on file  Stress: Not on file  Social Connections: Not on file  Intimate Partner Violence: Not on file    Review of Systems: See HPI, otherwise negative ROS  Physical Exam: BP 135/85    Pulse 89    Temp (!) 96.4 F (35.8 C) (Temporal)    Resp 18    Ht '5\' 6"'  (1.676 m)    Wt 75.8 kg    SpO2 100%    BMI 26.95 kg/m  General:   Alert,  pleasant and cooperative in NAD Head:  Normocephalic and atraumatic. Neck:  Supple; no masses or thyromegaly. Lungs:  Clear throughout to auscultation.    Heart:  Regular rate and rhythm. Abdomen:  Soft, nontender and nondistended. Normal bowel sounds, without guarding, and without rebound.   Neurologic:  Alert and  oriented x4;  grossly normal neurologically.  Impression/Plan: Megan [name] is here for an endoscopy to be performed for cirrhosis of liver, variceal screening and IDA  Risks,  benefits, limitations, and alternatives regarding  endoscopy have been reviewed with the patient.  Questions have been answered.  All parties agreeable.   Sherri Sear, MD  03/27/2021, 10:00 AM

## 2021-03-28 ENCOUNTER — Encounter: Payer: Self-pay | Admitting: Gastroenterology

## 2021-03-28 LAB — SURGICAL PATHOLOGY

## 2021-03-29 ENCOUNTER — Telehealth: Payer: Self-pay

## 2021-03-29 MED ORDER — OMEPRAZOLE 40 MG PO CPDR: 40.0000 mg | DELAYED_RELEASE_CAPSULE | Freq: Two times a day (BID) | ORAL | 0 refills | Status: DC

## 2021-03-29 NOTE — Telephone Encounter (Signed)
-----   Message from Lin Landsman, MD sent at 03/29/2021 11:35 AM EST ----- Please inform patient that the pathology results from upper endoscopy were normal.  She does have very small ulcers in her stomach.  Therefore, recommend 4-week course of omeprazole 40 mg p.o. twice daily before meals  RV

## 2021-03-29 NOTE — Telephone Encounter (Signed)
Patient verbalized understanding of results sent omeprazole to the pharmacy

## 2021-04-05 ENCOUNTER — Telehealth: Payer: Self-pay | Admitting: Gastroenterology

## 2021-04-05 MED ORDER — OMEPRAZOLE 40 MG PO CPDR: 40.0000 mg | DELAYED_RELEASE_CAPSULE | Freq: Two times a day (BID) | ORAL | 1 refills | Status: DC

## 2021-04-05 NOTE — Telephone Encounter (Signed)
Inbound call from pt stating that she called her pharmacy and they stated they don't not have a prescription for her omeprazole. The pt is asking if it can be sent over again. Thank you.

## 2021-04-05 NOTE — Telephone Encounter (Signed)
Re-sent to pharmacy.

## 2021-04-09 ENCOUNTER — Telehealth: Payer: Self-pay

## 2021-04-09 ENCOUNTER — Other Ambulatory Visit: Payer: Self-pay

## 2021-04-09 DIAGNOSIS — K769 Liver disease, unspecified: Secondary | ICD-10-CM

## 2021-04-09 MED ORDER — ACCU-CHEK FASTCLIX LANCET KIT: PACK | 1 refills | Status: DC

## 2021-04-09 MED ORDER — GLUCOSE BLOOD VI STRP: ORAL_STRIP | 2 refills | Status: DC

## 2021-04-09 NOTE — Telephone Encounter (Signed)
-----   Message from Lin Landsman, MD sent at 04/08/2021  5:18 PM EST ----- Called patient to discuss about the secondary liver disease work-up as well as the CT liver protocol results.   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  Patient agreed to undergo ultrasound-guided liver biopsy  I have also discussed CT scan results with the patient after reviewing with radiologist, Dr. Kris Hartmann.  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  This lesion does not have characteristics of hepatocellular carcinoma and her serum AFP levels are normal.  Patient is very reluctant to undergo MRI given claustrophobia unless she is fully sedated.  I told patient that either we can repeat triple phase CT scan in 6 months or try an MRI or perform ultrasound-guided biopsy of this lesion if amenable when she undergoes ultrasound-guided random liver biopsy.  Patient expressed understanding of my recommendations  Caryl Pina  Please call patient this week to schedule ultrasound-guided liver biopsy and please let me know when it is scheduled so that I can discuss with the radiologist about liver lesion  Thank you  Rohini Vanga

## 2021-04-09 NOTE — Telephone Encounter (Signed)
Order the Ultrasound Liver biopsy and faxed over the form the special procedures Called special procedures and informed Pamala Hurry that we faxed over this.

## 2021-04-10 ENCOUNTER — Encounter: Payer: Self-pay | Admitting: Family

## 2021-04-10 ENCOUNTER — Other Ambulatory Visit: Payer: Self-pay

## 2021-04-10 ENCOUNTER — Ambulatory Visit (INDEPENDENT_AMBULATORY_CARE_PROVIDER_SITE_OTHER): Payer: Medicare Other | Admitting: Family

## 2021-04-10 VITALS — BP 136/70 | HR 106 | Temp 97.0°F | Ht 66.0 in | Wt 170.0 lb

## 2021-04-10 DIAGNOSIS — L84 Corns and callosities: Secondary | ICD-10-CM

## 2021-04-10 DIAGNOSIS — K7030 Alcoholic cirrhosis of liver without ascites: Secondary | ICD-10-CM | POA: Diagnosis not present

## 2021-04-10 DIAGNOSIS — E1169 Type 2 diabetes mellitus with other specified complication: Secondary | ICD-10-CM

## 2021-04-10 DIAGNOSIS — E119 Type 2 diabetes mellitus without complications: Secondary | ICD-10-CM | POA: Diagnosis not present

## 2021-04-10 LAB — MICROALBUMIN / CREATININE URINE RATIO
Creatinine,U: 35.8 mg/dL
Microalb Creat Ratio: 2 mg/g (ref 0.0–30.0)
Microalb, Ur: <0.7 mg/dL (ref 0.0–1.9)

## 2021-04-10 LAB — POCT GLYCOSYLATED HEMOGLOBIN (HGB A1C): Hemoglobin A1C: 6.7 % — AB (ref 4.0–5.6)

## 2021-04-10 MED ORDER — DAPAGLIFLOZIN PROPANEDIOL 10 MG PO TABS: 10.0000 mg | ORAL_TABLET | Freq: Every day | ORAL | 1 refills | Status: AC

## 2021-04-10 MED ORDER — SITAGLIPTIN PHOSPHATE 100 MG PO TABS: 100.0000 mg | ORAL_TABLET | Freq: Every day | ORAL | 1 refills | Status: DC

## 2021-04-10 NOTE — Telephone Encounter (Signed)
Called special procedures and left a message for call back to find out when patient is schedule

## 2021-04-10 NOTE — Progress Notes (Signed)
Established Patient Office Visit  Subjective:  Patient ID: Megan [name], female    DOB: 04-Nov-1955  Age: 66 y.o. MRN: [id number]  CC:  Chief Complaint  Patient presents with   Medication Refill    HPI Megan [name] is here today for follow up. Pt is a Dr. Einar Pheasant pt.  Pt is without acute concerns.  DM2: currently tolerating 100 mg januvia as well as farxiga 10 mg once daily. Pt not checking daily at home with glucose meter because was having issues with the needle. Last eye exam was over a year ago. She does check her feet daily however does have issues with dryness of her feet/and calluses. She applies vaseline nightly. Last urine MA was 9/21. She does state calluses are painful, does not currently see podiatrist but would like to as these are painful. Right >left.   A1C in office today: 6.7  Lab Results  Component Value Date   HGBA1C 6.7 (A) 04/10/2021   Hypercalcemia: taking calcium daily.   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 Kurtis Bushman yesterday and doing well. Pt walking with cane which is helping her, right hip now acting up. She did get some xrays done yesterday for this as well.   CT abd/liver 03/26/21 showing hepatic cirrhosis, with a single hypovascular mass in right lobe. No hypervascularity.followed by Dr. Marius Ditch, GI, recently had EGD 03/27/21. She is pending a liver biopsy to be scheduled as well.   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 but stopped 09/16/20   Hypercholesteremia    Hypertension     Past Surgical History:  Procedure Laterality Date   COLONOSCOPY WITH PROPOFOL N/A 06/25/2020   Procedure: COLONOSCOPY WITH PROPOFOL;  Surgeon: Jonathon Bellows, MD;  Location: [facility] ENDOSCOPY;  Service: Endoscopy;   Laterality: N/A;   ESOPHAGOGASTRODUODENOSCOPY (EGD) WITH PROPOFOL N/A 03/27/2021   Procedure: ESOPHAGOGASTRODUODENOSCOPY (EGD) WITH PROPOFOL;  Surgeon: Lin Landsman, MD;  Location: [facility] ENDOSCOPY;  Service: Gastroenterology;  Laterality: N/A;   TOTAL HIP ARTHROPLASTY Left 01/11/2021   Procedure: TOTAL HIP ARTHROPLASTY ANTERIOR APPROACH;  Surgeon: Lovell Sheehan, MD;  Location: [facility] ORS;  Service: Orthopedics;  Laterality: Left;   TUBAL LIGATION      Family History  Adopted: Yes  Family history unknown: Yes    Social History   Socioeconomic History   Marital status: Married    Spouse name: Not on file   Number of children: Not on file   Years of education: Not on file   Highest education level: Not on file  Occupational History   Not on file  Tobacco Use   Smoking status: Every Day    Packs/day: 0.25    Years: 50.00    Pack years: 12.50    Types: Cigarettes   Smokeless tobacco: Never  Vaping Use   Vaping Use: Never used  Substance and Sexual Activity   Alcohol use: Not Currently    Comment: as of 7/3/222   Drug use: Never   Sexual activity: Not Currently  Other Topics Concern   Not on file  Social History Narrative   05/17/20   From: [place] originally, moved in 2006   Living: with husband (marital stress)  and adult daughter   Work: retired Patent attorney      Family: 2 children - Amie [place] (lives with her) and Gwyndolyn Saxon ([place]) - 2 grandchildren - Academic librarian (lives with him) and Vladimir Creeks       Enjoys: playing games on phone      Exercise: not currently   Diet: low appetite, alcohol       Safety   Seat belts: Yes    Guns: No   Safe in relationships: does not feel safe with husband - has asked him to leave   Social Determinants of Radio broadcast assistant Strain: Low Risk    Difficulty of Paying Living Expenses: Not very hard  Food Insecurity: Not on file  Transportation Needs: Not on file  Physical Activity: Not on file  Stress: Not on file   Social Connections: Not on file  Intimate Partner Violence: Not on file    Outpatient Medications Prior to Visit  Medication Sig Dispense Refill   albuterol (PROVENTIL HFA;VENTOLIN HFA) 108 (90 Base) MCG/ACT inhaler Inhale 1-2 puffs into the lungs every 6 (six) hours as needed for wheezing or shortness of breath.     aspirin 81 MG chewable tablet Chew 1 tablet (81 mg total) by mouth 2 (two) times daily. 60 tablet 0   atorvastatin (LIPITOR) 40 MG tablet Take 1 tablet (40 mg total) by mouth daily. 90 tablet 1   Biotin 5000 MCG TABS Take 5,000 mcg by mouth in the morning.     Biotin w/ Vitamins C & E (HAIR/SKIN/NAILS PO) Take 1 capsule by mouth in the morning.     BLACK CURRANT SEED OIL PO Take 1 capsule by mouth in the morning.     blood glucose meter kit and supplies KIT Dispense based on patient and insurance preference. Use up to four times daily as directed. 1 each 0   Calcium Carbonate-Vitamin D (CVS CALCIUM 600 + D PO) Take 1 tablet by mouth in the morning. Takes 3 times per week     diphenhydrAMINE (BENADRYL) 25 MG tablet Take 25 mg by mouth daily.     docusate sodium (COLACE) 50 MG capsule Take 50 mg by mouth in the morning.     glucose blood test strip Use as instructed, to test blood sugar twice daily. 100 each 2   Iron-FA-B Cmp-C-Biot-Probiotic (FUSION PLUS) CAPS Take 1 capsule by mouth daily. 30 capsule 2   Lancets Misc. (ACCU-CHEK FASTCLIX LANCET) KIT Use as directed to test blood sugar twice daily. 1 kit 1   lisinopril (ZESTRIL) 20 MG tablet Take 20 mg by mouth daily.     Omega-3 Fatty Acids (FISH OIL PO) Take 2,400 mg by mouth in the morning.     omeprazole (PRILOSEC) 40 MG capsule Take 1 capsule (40 mg total) by mouth 2 (two) times daily before a meal. 60 capsule 1   dapagliflozin propanediol (FARXIGA) 10 MG TABS tablet Take 1 tablet (10 mg total) by mouth daily. 90 tablet 3   sitaGLIPtin (JANUVIA) 100 MG tablet Take 1 tablet (100 mg total) by mouth daily. 90 tablet 1    diazepam (VALIUM) 5 MG tablet Take 1 tablet 1 hour prior to procedure, Take 1 tablet right before procedure if needed 2 tablet 0   No facility-administered medications prior to visit.    Allergies  Allergen Reactions   Peanut Butter Flavor Anaphylaxis   Peanut-Containing Drug Products Anaphylaxis    ROS Review of Systems  Review of Systems  Respiratory:  Negative for shortness of breath.   Cardiovascular:  Negative for chest pain and palpitations.  Gastrointestinal:  Negative for constipation and diarrhea.  Genitourinary:  Negative for dysuria, frequency and urgency.  Musculoskeletal:  Negative for myalgias.  Psychiatric/Behavioral:  Negative for depression and suicidal ideas.   Skin: corns/callouses some painful on bil heels of feet All other systems reviewed and are negative.    Objective:    Physical Exam  Diabetic Foot Exam - Simple   Simple Foot Form Diabetic Foot exam was performed with the following findings: Yes 04/10/2021 10:23 AM  Visual Inspection Sensation Testing Pulse Check Comments Multiple callous/corns seen on base of bil foot pad.           Gen: NAD, resting comfortably HEENT: TMs normal bilaterally. OP clear. No thyromegaly noted.  CV: RRR with no murmurs appreciated Pulm: NWOB, CTAB with no crackles, wheezes, or rhonchi GI: Normal bowel sounds present. Soft, Nontender, Nondistended. MSK: no edema, cyanosis, or clubbing noted left hip with limited ROM due to recent surgery, using cane with walking. Very mild pain with add/abduction Skin: warm, dry Psych: Normal affect and thought content  BP 136/70    Pulse (!) 106    Temp (!) 97 F (36.1 C) (Temporal)    Ht '5\' 6"'  (1.676 m)    Wt 170 lb (77.1 kg)    SpO2 97%    BMI 27.44 kg/m  Wt Readings from Last 3 Encounters:  04/10/21 170 lb (77.1 kg)  03/27/21 167 lb (75.8 kg)  03/05/21 167 lb (75.8 kg)     Health Maintenance Due  Topic Date Due   Pneumonia Vaccine 69+ Years old (1 - PCV) Never  done   Zoster Vaccines- Shingrix (1 of 2) Never done   DEXA SCAN  Never done   COVID-19 Vaccine (3 - Booster for Moderna series) 10/06/2019   INFLUENZA VACCINE  Never done   MAMMOGRAM  02/02/2021    There are no preventive care reminders to display for this patient.  No results found for: TSH Lab Results  Component Value Date   WBC 13.5 (H) 01/13/2021   HGB 10.2 (L) 01/13/2021   HCT 29.8 (L) 01/13/2021   MCV 90.3 01/13/2021   PLT 205 01/13/2021   Lab Results  Component Value Date   NA 135 03/05/2021   K 4.6 03/05/2021   CO2 23 03/05/2021   GLUCOSE 114 (H) 03/05/2021   BUN 13 03/05/2021   CREATININE 0.75 03/05/2021   BILITOT 1.0 12/05/2020   ALKPHOS 432 (H) 12/05/2020   AST 51 (H) 12/05/2020   ALT 29 12/05/2020   PROT 8.2 12/05/2020   ALBUMIN 3.7 12/05/2020   CALCIUM 12.1 (H) 03/05/2021   ANIONGAP 7 01/12/2021   EGFR 88 03/05/2021   GFR 67.66 12/05/2020   Lab Results  Component Value Date   CHOL 180 09/20/2020   Lab Results  Component Value Date   HDL 68.20 09/20/2020   Lab Results  Component Value Date   LDLCALC 84 09/20/2020   Lab Results  Component Value Date   TRIG 138.0 09/20/2020   Lab Results  Component Value Date   CHOLHDL 3 09/20/2020   Lab Results  Component Value Date   HGBA1C 6.7 (A) 04/10/2021      Assessment & Plan:   Problem List Items Addressed This Visit       Digestive   Cirrhosis of liver without ascites ([place])     Endocrine   Type 2 diabetes mellitus  without complication, without long-term current use of insulin ([place]) - Primary    Refer patient to ophthalmology for annual diabetic retinopathy exams.  Also patient to check feet daily for signs of infection.  Refer to podiatry for corns and calluses on feet.  Also recommended corn pads.  A1c is great within the office today patient to continue with medication as prescribed refills given for pharmacy as well as Januvia that she is tolerating well.  Patient to work on a diabetic  diet and exercise as tolerated.  Also ordered urine microalbumin for annual screening as well pending results.      Relevant Medications   sitaGLIPtin (JANUVIA) 100 MG tablet   dapagliflozin propanediol (FARXIGA) 10 MG TABS tablet     Musculoskeletal and Integument   Corns and callus    Corn pads recommended patient referred to podiatry for ongoing management.      Relevant Orders   Ambulatory referral to Podiatry    Meds ordered this encounter  Medications   sitaGLIPtin (JANUVIA) 100 MG tablet    Sig: Take 1 tablet (100 mg total) by mouth daily.    Dispense:  90 tablet    Refill:  1    Order Specific Question:   Supervising Provider    Answer:   BEDSOLE, AMY E [[id number]]   dapagliflozin propanediol (FARXIGA) 10 MG TABS tablet    Sig: Take 1 tablet (10 mg total) by mouth daily.    Dispense:  90 tablet    Refill:  1    Order Specific Question:   Supervising Provider    Answer:   Diona Browner, AMY E [[id number]]    Follow-up: Return in about 3 months (around 07/09/2021).    Eugenia Pancoast, FNP

## 2021-04-10 NOTE — Patient Instructions (Addendum)
Please leave a urine sample on the way out today from the office.   A referral was placed today for both opthamology as well as podiatry. Please let us know if you have not heard back within 1 week about your referral.  As a diabetic you should have a yearly eye exam to check for diabetic retinopathy, and check your feet daily for s/s infection.  I have also refilled your requested medication.   It was a pleasure seeing you today! Please do not hesitate to reach out with any questions and or concerns.  Regards,   Eugenia Pancoast FNP-C

## 2021-04-10 NOTE — Telephone Encounter (Signed)
Informed patient and she verbalized understanding.  

## 2021-04-10 NOTE — Telephone Encounter (Signed)
Patient is scheduled for a liver biopsy for 04/19/21 [facility] 9[facility]00[facility].

## 2021-04-11 NOTE — Assessment & Plan Note (Signed)
Refer patient to ophthalmology for annual diabetic retinopathy exams.  Also patient to check feet daily for signs of infection.  Refer to podiatry for corns and calluses on feet.  Also recommended corn pads.  A1c is great within the office today patient to continue with medication as prescribed refills given for pharmacy as well as Januvia that she is tolerating well.  Patient to work on a diabetic diet and exercise as tolerated.  Also ordered urine microalbumin for annual screening as well pending results.

## 2021-04-11 NOTE — Assessment & Plan Note (Signed)
Corn pads recommended patient referred to podiatry for ongoing management.

## 2021-04-16 NOTE — Progress Notes (Signed)
Patient on schedule for Liver Biopsy 04/19/2021, called and spoke with patient regarding pre procedure instructions. Made aware to be here @ 0900, NPO after [place] prior to procedure and driver post procedure./recovery/discharge, and needing responsible driver to take patient home post recovery. Stated to me at that time that [facility] transportation takes care of her getting to and from procedures., very argumentative during conversation when I addressed to her that [facility] Popper can not take her home post liver biopsy if this is who will provide transportation, she stated during conversation that we all always provided her transportation for procedures because she has no one. Encouraged her to have a responsible driver to and from procedure on 2/3, stated understanding. Also reached out to Ashleigh from GI endo clinic in regards to see who is getting patient home post procedure.

## 2021-04-17 ENCOUNTER — Telehealth: Payer: Self-pay

## 2021-04-17 ENCOUNTER — Ambulatory Visit (INDEPENDENT_AMBULATORY_CARE_PROVIDER_SITE_OTHER): Payer: Medicare Other | Admitting: Podiatrist

## 2021-04-17 ENCOUNTER — Other Ambulatory Visit: Payer: Self-pay

## 2021-04-17 ENCOUNTER — Encounter: Payer: Self-pay | Admitting: Podiatrist

## 2021-04-17 DIAGNOSIS — E1151 Type 2 diabetes mellitus with diabetic peripheral angiopathy without gangrene: Secondary | ICD-10-CM | POA: Diagnosis not present

## 2021-04-17 DIAGNOSIS — L84 Corns and callosities: Secondary | ICD-10-CM

## 2021-04-17 DIAGNOSIS — B351 Tinea unguium: Secondary | ICD-10-CM

## 2021-04-17 DIAGNOSIS — M79609 Pain in unspecified limb: Secondary | ICD-10-CM | POA: Diagnosis not present

## 2021-04-17 NOTE — Patient Instructions (Signed)

## 2021-04-17 NOTE — Progress Notes (Signed)
Chief Complaint  Patient presents with   Nail Problem   Callouses   Diabetes    Patient presents for nail trim and callus trim.  She is also Diabetic   no other complaints today     HPI: Patient is 66 y.o. female who presents today for diabetic foot evaluation with a nail trim and callus trim.  She relates no numbness or tingling in the feet, no cramping in the calves when she walks.  Overall she states the calluses have become thick and painful and she is unable to trim them.  Her last hemoglobin A1c was 6.7 and she relates this stays under good control.  Patient Active Problem List   Diagnosis Date Noted   Corns and callus 04/10/2021   Cirrhosis of liver without ascites (HCC)    Iron deficiency anemia    History of total hip replacement, left 01/11/2021   Left breast abscess 12/05/2020   Tobacco use 12/05/2020   Fatty liver disease, nonalcoholic 16/12/9602   Arthritis of left hip 09/20/2020   Assistance needed with transportation 09/20/2020   Hepatomegaly 05/17/2020   Hyperparathyroidism ([place]) 01/19/2018   Mixed hyperlipidemia 01/19/2018   Class 1 obesity with body mass index (BMI) of 33.0 to 33.9 in adult 05/27/2017   Essential hypertension 05/27/2017   Gastroesophageal reflux disease without esophagitis 05/27/2017   Mucopurulent chronic bronchitis ([place]) 05/27/2017   Type 2 diabetes mellitus without complication, without long-term current use of insulin ([place]) 05/27/2017    Current Outpatient Medications on File Prior to Visit  Medication Sig Dispense Refill   albuterol (PROVENTIL HFA;VENTOLIN HFA) 108 (90 Base) MCG/ACT inhaler Inhale 1-2 puffs into the lungs every 6 (six) hours as needed for wheezing or shortness of breath.     aspirin 81 MG chewable tablet Chew 1 tablet (81 mg total) by mouth 2 (two) times daily. 60 tablet 0   atorvastatin (LIPITOR) 40 MG tablet Take 1 tablet (40 mg total) by mouth daily. 90 tablet 1   Biotin 5000 MCG TABS Take 5,000 mcg by mouth in the  morning.     Biotin w/ Vitamins C & E (HAIR/SKIN/NAILS PO) Take 1 capsule by mouth in the morning.     BLACK CURRANT SEED OIL PO Take 1 capsule by mouth in the morning.     blood glucose meter kit and supplies KIT Dispense based on patient and insurance preference. Use up to four times daily as directed. 1 each 0   Calcium Carbonate-Vitamin D (CVS CALCIUM 600 + D PO) Take 1 tablet by mouth in the morning. Takes 3 times per week     dapagliflozin propanediol (FARXIGA) 10 MG TABS tablet Take 1 tablet (10 mg total) by mouth daily. 90 tablet 1   diphenhydrAMINE (BENADRYL) 25 MG tablet Take 25 mg by mouth daily.     docusate sodium (COLACE) 50 MG capsule Take 50 mg by mouth in the morning.     glucose blood test strip Use as instructed, to test blood sugar twice daily. 100 each 2   Iron-FA-B Cmp-C-Biot-Probiotic (FUSION PLUS) CAPS Take 1 capsule by mouth daily. 30 capsule 2   Lancets Misc. (ACCU-CHEK FASTCLIX LANCET) KIT Use as directed to test blood sugar twice daily. 1 kit 1   lisinopril (ZESTRIL) 20 MG tablet Take 20 mg by mouth daily.     Omega-3 Fatty Acids (FISH OIL PO) Take 2,400 mg by mouth in the morning.     omeprazole (PRILOSEC) 40 MG capsule Take 1 capsule (40 mg  total) by mouth 2 (two) times daily before a meal. 60 capsule 1   sitaGLIPtin (JANUVIA) 100 MG tablet Take 1 tablet (100 mg total) by mouth daily. 90 tablet 1   No current facility-administered medications on file prior to visit.    Allergies  Allergen Reactions   Peanut Butter Flavor Anaphylaxis   Peanut-Containing Drug Products Anaphylaxis    Review of Systems No fevers, chills, nausea, muscle aches, no difficulty breathing, no calf pain, no chest pain or shortness of breath.   Physical Exam  GENERAL APPEARANCE: Alert, conversant. Appropriately groomed. No acute distress.   VASCULAR: Pedal pulses palpable 1/4 DP and  non palpable PT bilateral.  Capillary refill time is immediate to all digits,  Proximal to distal  cooling it warm to warm.  Digital hair growth absent. Multiple variscosities present.  Mild edema bilateral ankles noted.   NEUROLOGIC: sensation is intact to 5.07 monofilament at 5/5 sites bilateral.  Light touch is intact bilateral, vibratory sensation intact bilateral  MUSCULOSKELETAL: acceptable muscle strength, tone and stability bilateral.  Forefoot cavus noted with prominent metatarsals present.    No pain, crepitus or limitation noted with foot and ankle range of motion bilateral.   DERMATOLOGIC: skin is warm, supple, and dry.  No open lesions noted.  No rash, no pre ulcerative lesions. Large intractable hyperkeratotic lesions are presenet submet 1 and submet 4/5 of the right foot as well as submet 4 of the left foot. They are painful with direct pressure.  Digital nails are thick, discolored, dystrophic, brittle with subungual debris present and clinically mycotic x 10.     Assessment     ICD-10-CM   1. Diabetes mellitus with peripheral circulatory disorder, controlled (HCC)  E11.51     2. Pain due to onychomycosis of nail  B35.1    M79.609     3. Corns and callus  L84          Plan  Discussed exam findings and treatment options with the patient.  I recommended debridement of the keratotic lesions as well as trimming of the nails which was accomplished today without complication the nails were trimmed via manual mechanical means and the lesions were pared with a #15 blade down to a safe level.  The patient tolerated this well.  I recommended continued care every 3 months for follow-up.  Of note we did discuss diabetic shoes and inserts.  She is not interested in the diabetic shoes however she is interested in diabetic inserts.  At her next visit she will be scheduled on a day where Aaron Edelman and Dr. Sharyon Cable are here and the inserts discussed at this time.  She will call if any problems or concerns arise in the meantime otherwise she will be seen back in 3 months for follow-up.

## 2021-04-17 NOTE — Progress Notes (Signed)
° ° °  Chronic Care Management Pharmacy Assistant   Name: SHARRA CAYABYAB  MRN: [id number] DOB: 1956/01/02  Reason for Encounter: CCM (Appointment Reminder)   Medications: Outpatient Encounter Medications as of 04/17/2021  Medication Sig   albuterol (PROVENTIL HFA;VENTOLIN HFA) 108 (90 Base) MCG/ACT inhaler Inhale 1-2 puffs into the lungs every 6 (six) hours as needed for wheezing or shortness of breath.   aspirin 81 MG chewable tablet Chew 1 tablet (81 mg total) by mouth 2 (two) times daily.   atorvastatin (LIPITOR) 40 MG tablet Take 1 tablet (40 mg total) by mouth daily.   Biotin 5000 MCG TABS Take 5,000 mcg by mouth in the morning.   Biotin w/ Vitamins C & E (HAIR/SKIN/NAILS PO) Take 1 capsule by mouth in the morning.   BLACK CURRANT SEED OIL PO Take 1 capsule by mouth in the morning.   blood glucose meter kit and supplies KIT Dispense based on patient and insurance preference. Use up to four times daily as directed.   Calcium Carbonate-Vitamin D (CVS CALCIUM 600 + D PO) Take 1 tablet by mouth in the morning. Takes 3 times per week   dapagliflozin propanediol (FARXIGA) 10 MG TABS tablet Take 1 tablet (10 mg total) by mouth daily.   diphenhydrAMINE (BENADRYL) 25 MG tablet Take 25 mg by mouth daily.   docusate sodium (COLACE) 50 MG capsule Take 50 mg by mouth in the morning.   glucose blood test strip Use as instructed, to test blood sugar twice daily.   Iron-FA-B Cmp-C-Biot-Probiotic (FUSION PLUS) CAPS Take 1 capsule by mouth daily.   Lancets Misc. (ACCU-CHEK FASTCLIX LANCET) KIT Use as directed to test blood sugar twice daily.   lisinopril (ZESTRIL) 20 MG tablet Take 20 mg by mouth daily.   Omega-3 Fatty Acids (FISH OIL PO) Take 2,400 mg by mouth in the morning.   omeprazole (PRILOSEC) 40 MG capsule Take 1 capsule (40 mg total) by mouth 2 (two) times daily before a meal.   sitaGLIPtin (JANUVIA) 100 MG tablet Take 1 tablet (100 mg total) by mouth daily.   No facility-administered  encounter medications on file as of 04/17/2021.   KILI GRACY was contacted to remind of upcoming telephone visit with Charlene Brooke on 04/22/2021 at 1:00 pm. Patient was reminded to have all medications, supplements and any blood glucose and blood pressure readings available for review at appointment. If unable to reach, a voicemail was left for patient.   Are you having any problems with your medications? No   Do you have any concerns you like to discuss with the pharmacist? No  Star Rating Drugs: Medication:  Last Fill: Day Supply Januvia 100 mg 04/10/2021 90  Farxiga 10 mg  03/29/2021 30   Atorvastatin 40 mg 04/10/2021 30  Lisinopril 20 mg 03/29/2021 [place], CPP notified  [name], [place] [phone]  Time Spent: 10 Minutes

## 2021-04-18 ENCOUNTER — Other Ambulatory Visit: Payer: Self-pay | Admitting: Student

## 2021-04-18 NOTE — H&P (Incomplete)
Chief Complaint: Patient was seen in consultation today for liver lesion biopsy   Referring Physician(s): Lin Landsman  Supervising Physician: Juliet Rude  Patient Status: [facility] - Out-pt  History of Present Illness: Megan [name] is a 66 y.o. [name] with a medical history significant for asthma, DM and alcoholic cirrhosis. She was referred to gastroenterology for evaluation of elevated liver function tests and a liver lesion first identified on ultrasound 06/19/20. Additional imaging ordered.    CT Liver Abdomen 03/26/21 IMPRESSION: 1. Hepatic cirrhosis. 2. 1.9 cm hypovascular mass in right hepatic lobe. This has nonspecific characteristics, but does not show hypervascularity seen with hepatocellular carcinoma. Recommend abdomen MRI without and with contrast for further characterization. 3. Cholelithiasis. No radiographic evidence of cholecystitis. 4. Colonic diverticulosis. No radiographic evidence of diverticulitis. 5. Aortic Atherosclerosis (ICD10-I70.0).  Interventional Radiology has been asked to evaluate this patient for an image-guided liver lesion biopsy for further work up. Imaging reviewed and procedure approved by Dr. Annamaria Boots   Past Medical History:  Diagnosis Date   Arthritis    Asthma    Diabetes mellitus without complication ([place])    diet controlled   Dyspnea    GERD (gastroesophageal reflux disease)    Hepatitis    per patient was diagnosed with cirrosis due to drinking alcohol but stopped 09/16/20   Hypercholesteremia    Hypertension     Past Surgical History:  Procedure Laterality Date   COLONOSCOPY WITH PROPOFOL N/A 06/25/2020   Procedure: COLONOSCOPY WITH PROPOFOL;  Surgeon: Jonathon Bellows, MD;  Location[facility] END[facility]COPY;  Service: Endoscopy;  Laterality: N/A;   ESOPHAGOGASTRODUODENOSCOPY (EGD) WITH PROPOFOL N/A 03/27/2021   Procedure: ESOPHAGOGASTRODUODENOSCOPY (EGD) WITH PROPOFOL;  Surgeon: Lin Landsman, MD;  Location: [facility] ENDOSCOPY;   Service: Gastroenterology;  Laterality: N/A;   TOTAL HIP ARTHROPLASTY Left 01/11/2021   Procedure: TOTAL HIP ARTHROPLASTY ANTERIOR APPROACH;  Surgeon: Lovell Sheehan, MD;  Location: [facility] ORS;  Service: Orthopedics;  Laterality: Left;   TUBAL LIGATION      Allergies: Peanut butter flavor and Peanut-containing drug products  Medications: Prior to Admission medications   Medication Sig Start Date End Date Taking? Authorizing Provider  albuterol (PROVENTIL HFA;VENTOLIN HFA) 108 (90 Base) MCG/ACT inhaler Inhale 1-2 puffs into the lungs every 6 (six) hours as needed for wheezing or shortness of breath.    [provider]  aspirin 81 MG chewable tablet Chew 1 tablet (81 mg total) by mouth 2 (two) times daily. 01/12/21   Carlynn Spry, PA-C  atorvastatin (LIPITOR) 40 MG tablet Take 1 tablet (40 mg total) by mouth daily. 02/27/20   Elby Beck, FNP  Biotin 5000 MCG TABS Take 5,000 mcg by mouth in the morning.    [provider]  Biotin w/ Vitamins C & E (HAIR/SKIN/NAILS PO) Take 1 capsule by mouth in the morning.    [provider]  BLACK CURRANT SEED OIL PO Take 1 capsule by mouth in the morning.    [provider]  blood glucose meter kit and supplies KIT Dispense based on patient and insurance preference. Use up to four times daily as directed. 12/05/20   Lesleigh Noe, MD  Calcium Carbonate-Vitamin D ([employer] CALCIUM 600 + D PO) Take 1 tablet by mouth in the morning. Takes 3 times per week    [provider]  dapagliflozin propanediol (FARXIGA) 10 MG TABS tablet Take 1 tablet (10 mg total) by mouth daily. 04/10/21 10/07/21  Eugenia Pancoast, FNP  diphenhydrAMINE (BENADRYL) 25 MG tablet  Take 25 mg by mouth daily.    [provider]  docusate sodium (COLACE) 50 MG capsule Take 50 mg by mouth in the morning.    [provider]  glucose blood test strip Use as instructed, to test blood sugar twice daily. 04/09/21   Lesleigh Noe, MD   Iron-FA-B Cmp-C-Biot-Probiotic (FUSION PLUS) CAPS Take 1 capsule by mouth daily. 03/06/21   Lin Landsman, MD  Lancets Misc. (ACCU-CHEK FASTCLIX LANCET) KIT Use as directed to test blood sugar twice daily. 04/09/21   Lesleigh Noe, MD  lisinopril (ZESTRIL) 20 MG tablet Take 20 mg by mouth daily. 11/02/19   [provider]  Omega-3 Fatty Acids (FISH OIL PO) Take 2,400 mg by mouth in the morning.    [provider]  omeprazole (PRILOSEC) 40 MG capsule Take 1 capsule (40 mg total) by mouth 2 (two) times daily before a meal. 04/05/21 05/05/21  Vanga, Tally Due, MD  sitaGLIPtin (JANUVIA) 100 MG tablet Take 1 tablet (100 mg total) by mouth daily. 04/10/21   Eugenia Pancoast, FNP     Family History  Adopted: Yes  Family history unknown: Yes    Social History   Socioeconomic History   Marital status: Married    Spouse name: Not on file   Number of children: Not on file   Years of education: Not on file   Highest education level: Not on file  Occupational History   Not on file  Tobacco Use   Smoking status: Every Day    Packs/day: 0.25    Years: 50.00    Pack years: 12.50    Types: Cigarettes   Smokeless tobacco: Never  Vaping Use   Vaping Use: Never used  Substance and Sexual Activity   Alcohol use: Not Currently    Comment: as of 7/3/222   Drug use: Never   Sexual activity: Not Currently  Other Topics Concern   Not on file  Social History Narrative   05/17/20   From: [place] originally, moved in 2006   Living: with husband (marital stress) and adult daughter   Work: retired Patent attorney      Family: 2 children - Amie [place] (lives with her) and [place] ([place]) - 2 grandchildren - Academic librarian (lives with him) and Vladimir Creeks       Enjoys: playing games on phone      Exercise: not currently   Diet: low appetite, alcohol       Safety   Seat belts: Yes    Guns: No   Safe in relationships: does not feel safe with husband - has asked him to leave   Social  Determinants of Radio broadcast assistant Strain: Low Risk    Difficulty of Paying Living Expenses: Not very hard  Food Insecurity: Not on file  Transportation Needs: Not on file  Physical Activity: Not on file  Stress: Not on file  Social Connections: Not on file    Review of Systems: A 12 point ROS discussed and pertinent positives are indicated in the HPI above.  All other systems are negative.  Review of Systems  Vital Signs: There were no vitals taken for this visit.  Physical Exam  Imaging: CT LIVER ABDOMEN W WO CONTRAST  Result Date: 03/26/2021 CLINICAL DATA:  Hepatic cirrhosis.  Liver lesion seen on ultrasound. EXAM: CT ABDOMEN WITHOUT AND WITH CONTRAST TECHNIQUE: Multidetector CT imaging of the abdomen was performed following the standard protocol before and following the bolus  administration of intravenous contrast. RADIATION DOSE REDUCTION: This exam was performed according to the departmental dose-optimization program which includes automated exposure control, adjustment of the mA and/or kV according to patient size and/or use of iterative reconstruction technique. CONTRAST:  188m OMNIPAQUE IOHEXOL 300 MG/ML  SOLN COMPARISON:  Ultrasound on 09/24/2020 FINDINGS: Lower chest: No acute findings. Hepatobiliary: Hepatic cirrhosis is demonstrated. A single hypovascular mass is seen in segment 6 of the right lobe which measures 1.9 x 1.6 cm (image 36/7). This has nonspecific characteristics, but does not show hypervascularity seen with hepatocellular carcinoma. No other liver lesions identified. Several tiny sub-cm gallstones are noted, however there is no evidence of cholecystitis or biliary ductal dilatation. Pancreas:  No mass or inflammatory changes. Spleen:  Within normal limits in size and appearance. Adrenals/Urinary Tract: No masses identified. Mild right renal parenchymal scarring noted. Sub-cm cyst seen in the posterior interpolar region of the left kidney. No evidence of  hydronephrosis. Stomach/Bowel: Left-sided colonic diverticulosis is noted, however there is no evidence of diverticulitis in this region. Vascular/Lymphatic: No pathologically enlarged lymph nodes identified. No acute vascular findings. Aortic atherosclerotic calcification noted. Other:  None. Musculoskeletal:  No suspicious bone lesions identified. IMPRESSION: Hepatic cirrhosis. 1.9 cm hypovascular mass in right hepatic lobe. This has nonspecific characteristics, but does not show hypervascularity seen with hepatocellular carcinoma. Recommend abdomen MRI without and with contrast for further characterization. Cholelithiasis. No radiographic evidence of cholecystitis. Colonic diverticulosis. No radiographic evidence of diverticulitis. Aortic Atherosclerosis (ICD10-I70.0). Electronically Signed   By: JMarlaine HindM.D.   On: 03/26/2021 15:10    Labs:  CBC: Recent Labs    09/20/20 1045 12/05/20 0911 01/12/21 0425 01/13/21 0412  WBC 7.3 8.8 10.7* 13.5*  HGB 11.7* 11.8* 10.1* 10.2*  HCT 36.3 36.8 30.0* 29.8*  PLT 297.0 266.0 214 205    COAGS: Recent Labs    12/05/20 0911 12/20/20 1123  INR 1.2* 1.1  APTT  --  39*    BMP: Recent Labs    09/20/20 1045 12/05/20 0911 01/12/21 0425 03/05/21 0925  NA 135 133* 134* 135  K 3.9 4.2 3.9 4.6  CL 99 98 103 99  CO2 '28 28 24 23  ' GLUCOSE 310* 355* 191* 114*  BUN '15 13 18 13  ' CALCIUM 11.9* 11.9* 10.9* 12.1*  CREATININE 0.87 0.89 0.76 0.75  GFRNONAA  --   --  >60  --     LIVER FUNCTION TESTS: Recent Labs    09/20/20 1045 12/05/20 0911  BILITOT 1.0 1.0  AST 73* 51*  ALT 40* 29  ALKPHOS 330* 432*  PROT 8.2 8.2  ALBUMIN 4.1 3.7    TUMOR MARKERS: No results for input(s): AFPTM, CEA, CA199, CHROMGRNA in the last 8760 hours.  Assessment and Plan:  Liver lesion: [name][facility]-guided liver [facility]  [facility]opsy.  Risks and benefits of this procedure were discussed with the patient and/or patient's family including, but not limited to bleeding, infection, damage to adjacent structures or low yield requiring additional tests.  All of the questions were answered and there is agreement to proceed.  She has been NPO. She is not on any blood-thinning medications.   Consent signed and in chart.  Thank you for this interesting consult.  I greatly enjoyed meeting Megan KOWALand look forward to participating in their care.  A copy of this report was sent to the requesting provider on this date.  Electronically Signed: Soyla Dryer, AGACNP-BC [phone] 04/18/2021, 3:18 PM   I spent a total of  30 Minutes   in face to face in clinical consultation, greater than 50% of which was counseling/coordinating care for liver lesion biopsy

## 2021-04-19 ENCOUNTER — Ambulatory Visit
Admission: RE | Admit: 2021-04-19 | Discharge: 2021-04-19 | Disposition: A | Discharge status: Home / Self Care | Payer: Medicare Other | Source: Ambulatory Visit | Attending: Gastroenterology | Admitting: Gastroenterology

## 2021-04-22 ENCOUNTER — Ambulatory Visit (INDEPENDENT_AMBULATORY_CARE_PROVIDER_SITE_OTHER): Payer: Medicare Other | Admitting: Pharmacist

## 2021-04-22 ENCOUNTER — Other Ambulatory Visit: Payer: Self-pay

## 2021-04-22 DIAGNOSIS — E1169 Type 2 diabetes mellitus with other specified complication: Secondary | ICD-10-CM

## 2021-04-22 DIAGNOSIS — Z720 Tobacco use: Secondary | ICD-10-CM

## 2021-04-22 DIAGNOSIS — I10 Essential (primary) hypertension: Secondary | ICD-10-CM

## 2021-04-22 DIAGNOSIS — E782 Mixed hyperlipidemia: Secondary | ICD-10-CM

## 2021-04-22 NOTE — Progress Notes (Signed)
Chronic Care Management Pharmacy Note  04/23/2021 Name:  Megan [name] MRN:  [id number] DOB:  07/15/1955  Summary: -CCM follow up: pt endorses compliance with medications as prescribed -A1c recently improved 9.6 > 6.7%; pt is not checking BG but recently got a new glucometer and has not set it up yet  Recommendations/Changes made from today's visit: -No med changes -Advised to monitor BG a few days a week, varying time of day  Plan: -Crook will call patient 3 months for adherence review -Pharmacist follow up televisit scheduled for 6 months -PCP 40-monthf/u due around 07/09/21 - not yet scheduled   Subjective: Megan OCCHIPINTIis an 66y.o. year old female who is a primary patient of Cody, JJobe Marker MD.  The CCM team was consulted for assistance with disease management and care coordination needs.    Engaged with patient by telephone for follow up visit in response to provider referral for pharmacy case management and/or care coordination services.   Consent to Services:  The patient was given information about Chronic Care Management services, agreed to services, and gave verbal consent prior to initiation of services.  Please see initial visit note for detailed documentation.   Patient Care Team: CLesleigh Noe MD as PCP - General (Family Medicine) ADebbora Dus [facility] (Pharmacist)  Recent office visits:  04/10/21 NP TLadell HeadsOV: f/u T2DM. A1c 6.7. Refilled Januvia and FIran Referred for DM eye and foot exams.  12/05/2020 - JWaunita Schooner MD - Patient presented for pre-op clearance and diabetes. Labs: CBC, CMP, Protime-INR, EKG. Start: (BACTRIM DS) 800-160 MG tablet  10/24/2020 - JWaunita Schooner MD - Patient presented for diabetes and vaginal itching. Labs: A1c. Start: Januvia 100 mg daily  09/20/2020 - JWaunita Schooner MD - Patient presented for follow up for alcohol abuse and hyperlipidemia. Patient sober 4 days. Labs: CBC, CMP, Lipid.  [facility].  No medication changes.    Recent consult visits:  03/27/21 Admission for EGD.  03/05/21 Dr VMarius Ditch(GI): establish care for fatty liver disease. Ordered CT. Found severe IDA, start iron replacement (Fusion Plus)  12/19/2020 - Orthopedics - Patient presented for left hip pain. Plan is for left total hip replacement. Procedure is scheduled on: Monday. January 07, 2021.   06/25/2020 - KJonathon Bellows MD - Patient presented for Colonoscopy with Propofol.    Hospital visits: Medication Reconciliation was completed by comparing discharge summary, patients EMR and Pharmacy list, and upon discussion with patient.  Admitted to the hospital on 01/11/21 due to Left THA. Discharge date was . [facility] -started methocarbamol and hydrocodone-APAP  Medication Changes at Hospital Discharge: -Changed aspirin EC 81 mg to chewable aspirin 81 mg BID  Medications that remain the same after Hospital Discharge:??  -All other medications will remain the same.     Objective:  Lab Results  Component Value Date   CREATININE 0.75 03/05/2021   BUN 13 03/05/2021   GFR 67.66 12/05/2020   GFRNONAA >60 01/12/2021   NA 135 03/05/2021   K 4.6 03/05/2021   CALCIUM 12.1 (H) 03/05/2021   CO2 23 03/05/2021   GLUCOSE 114 (H) 03/05/2021    Lab Results  Component Value Date/Time   HGBA1C 6.7 (A) 04/10/2021 10:07 AM   HGBA1C 9.6 (A) 10/24/2020 09:20 AM   HGBA1C 5.8 12/12/2019 12:00 AM   GFR 67.66 12/05/2020 09:11 AM   GFR  69.63 09/20/2020 10:45 AM   MICROALBUR <0.7 04/10/2021 10:14 AM   MICROALBUR Negative 12/12/2019 12:00 AM    Last diabetic Eye exam: Referral place 12/19/20 by PCP  Last diabetic Foot exam: 10/24/20 - completed by PCP    Lab Results  Component Value Date   CHOL 180 09/20/2020   HDL 68.20 09/20/2020   LDLCALC 84 09/20/2020   TRIG 138.0 09/20/2020   CHOLHDL  3 09/20/2020    Hepatic Function Latest Ref Rng & Units 12/05/2020 09/20/2020 02/15/2020  Total Protein 6.0 - 8.3 g/dL 8.2 8.2 8.6(H)  Albumin 3.5 - 5.2 g/dL 3.7 4.1 4.2  AST 0 - 37 U/L 51(H) 73(H) 104(H)  ALT 0 - 35 U/L 29 40(H) 40(H)  Alk Phosphatase 39 - 117 U/L 432(H) 330(H) 367(H)  Total Bilirubin 0.2 - 1.2 mg/dL 1.0 1.0 0.7    No results found for: TSH, FREET4  CBC Latest Ref Rng & Units 01/13/2021 01/12/2021 12/05/2020  WBC 4.0 - 10.5 K/uL 13.5(H) 10.7(H) 8.8  Hemoglobin 12.0 - 15.0 g/dL 10.2(L) 10.1(L) 11.8(L)  Hematocrit 36.0 - 46.0 % 29.8(L) 30.0(L) 36.8  Platelets 150 - 400 K/uL 205 214 266.0    Lab Results  Component Value Date/Time   VD25OH 32.1 03/05/2021 09:25 AM    Clinical ASCVD: No  The 10-year ASCVD risk score (Arnett DK, et al., 2019) is: 38.7%   Values used to calculate the score:     Age: 66 years     Sex: Female     Is Non-Hispanic African American: Yes     Diabetic: Yes     Tobacco smoker: Yes     Systolic Blood Pressure: 102 mmHg     Is BP treated: Yes     HDL Cholesterol: 68.2 mg/dL     Total Cholesterol: 180 mg/dL    Depression screen [facility] 2/9 04/10/2021 02/20/2020 02/15/2020  Decreased Interest 0 3 3  Down, Depressed, Hopeless 0 3 3  PHQ - 2 Score 0 6 6  Altered sleeping - 3 3  Tired, decreased energy - 2 2  Change in appetite - 2 2  Feeling bad or failure about yourself  - 3 3  Trouble concentrating - 0 0  Moving slowly or fidgety/restless - 0 0  Suicidal thoughts - 0 0  PHQ-9 Score - 16 16     Social History   Tobacco Use  Smoking Status Every Day   Packs/day: 0.25   Years: 50.00   Pack years: 12.50   Types: Cigarettes  Smokeless Tobacco Never   BP Readings from Last 3 Encounters:  04/10/21 136/70  03/27/21 (!) 169/83  03/05/21 (!) 134/91   Pulse Readings from Last 3 Encounters:  04/10/21 (!) 106  03/27/21 74  03/05/21 79   Wt Readings from Last 3 Encounters:  04/10/21 170 lb (77.1 kg)  03/27/21 167 lb (75.8 kg)   03/05/21 167 lb (75.8 kg)   BMI Readings from Last 3 Encounters:  04/10/21 27.44 kg/m  03/27/21 26.95 kg/m  03/05/21 26.95 kg/m    Assessment/Interventions: Review of patient past medical history, allergies, medications, health status, including review of consultants reports, laboratory and other test data, was performed as part of comprehensive evaluation and provision of chronic care management services.   SDOH:  (Social Determinants of Health) assessments and interventions performed: Yes SDOH Interventions    Flowsheet Row Most Recent Value  SDOH Interventions   Food Insecurity Interventions Intervention Not Indicated  Transportation Interventions Other (Comment)  [pt has transportation  through insurance, has to call 3 days in advance]       SDOH Screenings   Alcohol Screen: Not on file  Depression (PHQ2-9): Low Risk    PHQ-2 Score: 0  Financial Resource Strain: Low Risk    Difficulty of Paying Living Expenses: Not very hard  Food Insecurity: No Food Insecurity   Worried About Charity fundraiser in the Last Year: Never true   Ran Out of Food in the Last Year: Never true  Housing: Not on file  Physical Activity: Not on file  Social Connections: Not on file  Stress: Not on file  Tobacco Use: High Risk   Smoking Tobacco Use: Every Day   Smokeless Tobacco Use: Never   Passive Exposure: Not on file  Transportation Needs: Unmet Transportation Needs   Lack of Transportation (Medical): Yes   Lack of Transportation (Non-Medical): Yes    CCM Care Plan  Allergies  Allergen Reactions   Peanut Butter Flavor Anaphylaxis   Peanut-Containing Drug Products Anaphylaxis    Medications Reviewed Today     Reviewed by Charlton Haws, [facility] (Pharmacist) on 04/22/21 at 1323  Med List Status: <None>   Medication Order Taking? Sig Documenting Provider Last Dose Status Informant  albuterol (PROVENTIL HFA;VENTOLIN HFA) 108 (90 Base) MCG/ACT inhaler [id number] Yes Inhale 1-2  puffs into the lungs every 6 (six) hours as needed for wheezing or shortness of breath. [provider] Taking Active Self  aspirin 81 MG chewable tablet [id number] Yes Chew 1 tablet (81 mg total) by mouth 2 (two) times daily. Carlynn Spry, PA-C Taking Active   atorvastatin (LIPITOR) 40 MG tablet [id number] Yes Take 1 tablet (40 mg total) by mouth daily. Elby Beck, FNP Taking Active Self  Biotin 5000 MCG TABS [id number] Yes Take 5,000 mcg by mouth in the morning. [provider] Taking Active Self  Biotin w/ Vitamins C & E (HAIR/SKIN/NAILS PO) [id number] Yes Take 1 capsule by mouth in the morning. [provider] Taking Active Self  BLACK CURRANT SEED OIL PO [id number] Yes Take 1 capsule by mouth in the morning. [provider] Taking Active Self  blood glucose meter kit and supplies KIT [id number] Yes Dispense based on patient and insurance preference. Use up to four times daily as directed. Lesleigh Noe, MD Taking Active Self  Calcium Carbonate-Vitamin D ([employer] CALCIUM 600 + D PO) [id number] Yes Take 1 tablet by mouth in the morning. Takes 3 times per week [provider] Taking Active Self  dapagliflozin propanediol (FARXIGA) 10 MG TABS tablet [id number] Yes Take 1 tablet (10 mg total) by mouth daily. Eugenia Pancoast, FNP Taking Active   diphenhydrAMINE (BENADRYL) 25 MG tablet [id number] Yes Take 25 mg by mouth daily. [provider] Taking Active Self  docusate sodium (COLACE) 50 MG capsule [id number] Yes Take 50 mg by mouth in the morning. [provider] Taking Active Self  glucose blood test strip [id number] Yes Use as instructed, to test blood sugar twice daily. Lesleigh Noe, MD Taking Active   Iron-FA-B Cmp-C-Biot-Probiotic (FUSION PLUS) CAPS [id number] Yes Take 1 capsule by mouth daily. Lin Landsman, MD Taking Active   Lancets Misc. (ACCU-CHEK FASTCLIX LANCET) KIT [id number] Yes Use as directed to test blood sugar twice  daily. Lesleigh Noe, MD Taking Active   lisinopril (ZESTRIL) 20 MG tablet [id number] Yes Take 20 mg by mouth daily. [provider] Taking Active Self  Omega-3 Fatty Acids (FISH OIL PO) [id number] Yes Take 2,400 mg  by mouth in the morning. [provider] Taking Active Self  omeprazole (PRILOSEC) 40 MG capsule [id number] Yes Take 1 capsule (40 mg total) by mouth 2 (two) times daily before a meal. Vanga, Tally Due, MD Taking Active   sitaGLIPtin (JANUVIA) 100 MG tablet [id number] Yes Take 1 tablet (100 mg total) by mouth daily. Eugenia Pancoast, FNP Taking Active             Patient Active Problem List   Diagnosis Date Noted   Corns and callus 04/10/2021   Cirrhosis of liver without ascites (HCC)    Iron deficiency anemia    History of total hip replacement, left 01/11/2021   Left breast abscess 12/05/2020   Tobacco use 12/05/2020   Fatty liver disease, nonalcoholic 64/40/3474   Arthritis of left hip 09/20/2020   Assistance needed with transportation 09/20/2020   Hepatomegaly 05/17/2020   Hyperparathyroidism ([place]) 01/19/2018   Mixed hyperlipidemia 01/19/2018   Class 1 obesity with body mass index (BMI) of 33.0 to 33.9 in adult 05/27/2017   Essential hypertension 05/27/2017   Gastroesophageal reflux disease without esophagitis 05/27/2017   Mucopurulent chronic bronchitis ([place]) 05/27/2017   Type 2 diabetes mellitus without complication, without long-term current use of insulin ([place]) 05/27/2017    Immunization History  Administered Date(s) Administered   Moderna Sars-Covid-2 Vaccination 07/11/2019, 08/11/2019     Conditions to be addressed/monitored:  Hypertension, Hyperlipidemia, Diabetes, and Tobacco use, Iron deficiency  Care Plan : [place]  Updates made by Charlton Haws, Martha since 04/23/2021 12:00 AM     Problem: Hypertension, Hyperlipidemia, Diabetes, and Tobacco use, Iron deficiency      Long-Range Goal: Disease mgmt   Start  Date: 04/23/2021  Expected End Date: 04/23/2022  This Visit's Progress: On track  Priority: High  Note:   Current Barriers:  Unable to independently monitor therapeutic efficacy  Pharmacist Clinical Goal(s):  Patient will achieve adherence to monitoring guidelines and medication adherence to achieve therapeutic efficacy through collaboration with PharmD and provider.   Interventions: 1:1 collaboration with Lesleigh Noe, MD regarding development and update of comprehensive plan of care as evidenced by provider attestation and co-signature Inter-disciplinary care team collaboration (see longitudinal plan of care) Comprehensive medication review performed; medication list updated in electronic medical record  Hypertension (BP goal <140/90) -Controlled - per clinic readings; pt is not checking BP at home and is not interested in getting a monitor -Current treatment: Lisinopril 20 mg daily- Appropriate, Effective, Safe, Accessible -Reviewed adherence, no gaps in fill history -Recommended to continue current medication  Diabetes (A1c goal <7%) -Controlled - A1c improved to 6.7% (from 9.6 10/2020); she is still not checking BG but does have a new glucometer, she has not set it up yet -Current home glucose readings: n/a -Current medications: Januvia 100 mg daily - Appropriate, Effective, Safe, Accessible Farxiga 10 mg daily - Appropriate, Effective, Safe, Accessible -Medications previously tried: glimepiride, metformin -Recommended to continue current medication  Hyperlipidemia: (LDL goal < 100) -Controlled - LDL is at goal -Dx fatty liver disease, alcohol abuse -Current treatment: Atorvastatin 40 mg daily -Appropriate, Effective, Safe, Accessible -Educated on Cholesterol goals; Benefits of statin for ASCVD risk reduction; -Recommended to continue current medication  Tobacco use (Goal quit smoking) -Uncontrolled - not discussed today. Reassess next visit.  Iron deficiency anemia  (Goal: improve iron levels) -Unknown control - pt just started iron supplement and endorses compliance; she has not had repeat CBC/iron studies yet; she reports she used to chew on  ice all the time but that craving has gone away, she was informed that ice cravings are a sign of iron defiency -Current treatment  Iron supplement (Fusion Plus) - Appropriate, Query Effective, Safe, Accessible -Recommended to continue current medication; repeat iron studies  Health Maintenance -Vaccine gaps: Flu, covid booster, Prevnar, Shingrix -Fatty liver, established with Dr Vicente Males 02/2021  Patient Goals/Self-Care Activities Patient will:  - take medications as prescribed as evidenced by patient report and record review focus on medication adherence by routine check glucose 2-3x per week, document, and provide at future appointments     Medication Assistance: None required.  Patient affirms current coverage meets needs. (Medicare/Medicaid)  Compliance/Adherence/Medication fill history: Care Gaps: DEXA Scan - DUE Mammogram - due 02/02/21 (ordered 12/05/20)  Star Rating Drugs:  Medication:                Last Fill:         Day Supply Farxiga 41m              03/29/21    30 PDC 69% Januvia 1019m           04/08/21    90      PDC 98% Atorvastatin 4089m     04/10/21    30 PDC 100% Lisinopril 90m33m         03/29/21    30       PDC 90%   Patient's [place] is[facility]  [place]: [phone]: [phone] pill box? Yes Pt endorses 100% compliance  Care Plan and Follow Up Patient Decision:  Patient agrees to Care Plan and Follow-up.  Follow Up Plan: Telephone follow up appointment with care management team member scheduled for:  6 months  LindCharlene BrookearmD, BCACP Clinical Pharmacist [facility] at [phone]

## 2021-04-23 NOTE — Patient Instructions (Addendum)
Visit Information  Phone number for Pharmacist: [phone]   Goals Addressed             This Visit's Progress    Monitor and Manage My Blood Sugar-Diabetes Type 2       Timeframe:  Long-Range Goal Priority:  High Start Date:   02/19/21                          Expected End Date:   02/19/22                    Follow Up Date Aug 2023   - check blood sugar at prescribed times - once a day, varying time of day - take the blood sugar meter to all doctor visits    Why is this important?   Checking your blood sugar at home helps to keep it from getting very high or very low.  Writing the results in a diary or log helps the doctor know how to care for you.  Your blood sugar log should have the time, date and the results.  Also, write down the amount of insulin or other medicine that you take.  Other information, like what you ate, exercise done and how you were feeling, will also be helpful.     Notes:         Care Plan : [place]  Updates made by Charlton Haws, RPH since 04/23/2021 12:00 AM     Problem: Hypertension, Hyperlipidemia, Diabetes, and Tobacco use, Iron deficiency      Long-Range Goal: Disease mgmt   Start Date: 04/23/2021  Expected End Date: 04/23/2022  This Visit's Progress: On track  Priority: High  Note:   Current Barriers:  Unable to independently monitor therapeutic efficacy  Pharmacist Clinical Goal(s):  Patient will achieve adherence to monitoring guidelines and medication adherence to achieve therapeutic efficacy through collaboration with PharmD and provider.   Interventions: 1:1 collaboration with Lesleigh Noe, MD regarding development and update of comprehensive plan of care as evidenced by provider attestation and co-signature Inter-disciplinary care team collaboration (see longitudinal plan of care) Comprehensive medication review performed; medication list updated in electronic medical record  Hypertension (BP goal  <140/90) -Controlled - per clinic readings; pt is not checking BP at home and is not interested in getting a monitor -Current treatment: Lisinopril 20 mg daily- Appropriate, Effective, Safe, Accessible -Reviewed adherence, no gaps in fill history -Recommended to continue current medication  Diabetes (A1c goal <7%) -Controlled - A1c improved to 6.7% (from 9.6 10/2020); she is still not checking BG but does have a new glucometer, she has not set it up yet -Current home glucose readings: n/a -Current medications: Januvia 100 mg daily - Appropriate, Effective, Safe, Accessible Farxiga 10 mg daily - Appropriate, Effective, Safe, Accessible -Medications previously tried: glimepiride, metformin -Recommended to continue current medication  Hyperlipidemia: (LDL goal < 100) -Controlled - LDL is at goal -Dx fatty liver disease, alcohol abuse -Current treatment: Atorvastatin 40 mg daily -Appropriate, Effective, Safe, Accessible -Educated on Cholesterol goals; Benefits of statin for ASCVD risk reduction; -Recommended to continue current medication  Tobacco use (Goal quit smoking) -Uncontrolled - not discussed today. Reassess next visit.  Iron deficiency anemia (Goal: improve iron levels) -Unknown control - pt just started iron supplement and endorses compliance; she has not had repeat CBC/iron studies yet; she reports she used to chew on ice all the time but that craving has  gone away, she was informed that ice cravings are a sign of iron defiency -Current treatment  Iron supplement (Fusion Plus) - Appropriate, Query Effective, Safe, Accessible -Recommended to continue current medication; repeat iron studies  Health Maintenance -Vaccine gaps: Flu, covid booster, Prevnar, Shingrix -Fatty liver, established with Dr Vicente Males 02/2021  Patient Goals/Self-Care Activities Patient will:  - take medications as prescribed as evidenced by patient report and record review focus on medication adherence by  routine check glucose 2-3x per week, document, and provide at future appointments      Patient verbalizes understanding of instructions and care plan provided today and agrees to view in [place]. Active MyChart status confirmed with patient.   Telephone follow up appointment with pharmacy team member scheduled for: 6 months  Charlene Brooke, PharmD, [facility] Clinical Pharmacist [place] [facility] at [facility] [phone]

## 2021-04-24 ENCOUNTER — Encounter: Payer: Self-pay | Admitting: *Deleted

## 2021-04-29 ENCOUNTER — Ambulatory Visit: Payer: Medicare Other

## 2021-05-08 ENCOUNTER — Other Ambulatory Visit: Payer: Self-pay | Admitting: Orthopedic Surgery

## 2021-05-08 ENCOUNTER — Encounter: Payer: Self-pay | Admitting: Orthopedic Surgery

## 2021-05-08 DIAGNOSIS — Z01818 Encounter for other preprocedural examination: Secondary | ICD-10-CM

## 2021-05-08 NOTE — H&P (Signed)
NAME: Megan [name] MRN:   [id number] DOB:   1955-05-01     HISTORY AND PHYSICAL  CHIEF COMPLAINT:  right hip pain  HISTORY:   Megan [name] a 66 y.o. female  with right  Hip Pain Patient complains of right hip pain. Onset of the symptoms was several years ago. Inciting event: known DJD. The patient reports the hip pain is worse with weight bearing. Associated symptoms: none. Aggravating symptoms include: any weight bearing, going up and down stairs, and inactivity. Patient has had prior hip problems. Previous visits for this problem:  Dr. Harlow Mares . Evaluation to date: plain films, which were abnormal  osteoarthritis . Treatment to date: OTC analgesics, which have been somewhat effective, prescription analgesics, which have been somewhat effective, home exercise program, which has been somewhat effective, and physical therapy, which has been not very effective.  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 stopped 09/16/20   Hypercholesteremia    Hypertension     PAST SURGICAL HISTORY:   Past Surgical History:  Procedure Laterality Date   COLONOSCOPY WITH PROPOFOL N/A 06/25/2020   Procedure: COLONOSCOPY WITH PROPOFOL;  Surgeon: Jonathon Bellows, MD;  Location[facility] ENDOSCOPY;  Service: Endoscopy;  Laterality: N/A;   ESOPHAGOGASTRODUODENOSCOPY (EGD) WITH PROPOFOL N/A 03/27/2021   Procedure: ESOPHAGOGASTRODUODENOSCOPY (EGD) WITH PROPOFOL;  Surgeon: Lin Landsman, MD;  Location[facility] ENDOSCOPY;  Service: Gastroenterology;  Laterality: N/A;   TOTAL HIP ARTHROPLASTY Left 01/11/2021   Procedure: TOTAL HIP ARTHROPLASTY ANTERIOR APPROACH;  Surgeon: Lovell Sheehan, MD;  Location: [facility] ORS;  Service:  Orthopedics;  Laterality: Left;   TUBAL LIGATION      MEDICATIONS:  (Not in a hospital admission)   ALLERGIES:   Allergies  Allergen Reactions   Peanut Butter Flavor Anaphylaxis   Peanut-Containing Drug Products Anaphylaxis    REVIEW OF SYSTEMS:   Negative except HPI  FAMILY HISTORY:   Family History  Adopted: Yes  Family history unknown: Yes    SOCIAL HISTORY:   reports that she has been smoking cigarettes. She has a 12.50 pack-year smoking history. She has never used smokeless tobacco. She reports that she does not currently use alcohol. She reports that she does not use drugs.  PHYSICAL EXAM:  General appearance: alert, cooperative, and no distress Neck: no JVD and supple, symmetrical, trachea midline Resp: clear to auscultation bilaterally Cardio: regular rate and rhythm, S1, S2 normal, no murmur, click, rub or gallop GI: soft, non-tender; bowel sounds normal; no masses,  no organomegaly Extremities: extremities normal, atraumatic, no cyanosis or edema and Homans sign is negative, no sign of DVT Pulses: 2+ and symmetric Skin: Skin color, texture, turgor normal. No rashes or lesions Neurologic: Alert and oriented X 3, normal strength and tone. Normal symmetric reflexes. Normal coordination and gait Incision/Wound:    LABORATORY STUDIES: No results for input(s): WBC, HGB, HCT, PLT in the last 72 hours.  No results for input(s): NA, K, CL, CO2, GLUCOSE, BUN, CREATININE, CALCIUM in the last 72 hours.  STUDIES/RESULTS:  No results found.  ASSESSMENT:  End stage osteoarthritis right hip        Active Problems:   * No active  hospital problems. *    PLAN:  Right Primary Total Hip   Carlynn Spry 05/08/2021. 7:44 AM

## 2021-05-14 DIAGNOSIS — I10 Essential (primary) hypertension: Secondary | ICD-10-CM | POA: Diagnosis not present

## 2021-05-14 DIAGNOSIS — E785 Hyperlipidemia, unspecified: Secondary | ICD-10-CM

## 2021-05-14 DIAGNOSIS — D509 Iron deficiency anemia, unspecified: Secondary | ICD-10-CM | POA: Diagnosis not present

## 2021-05-14 DIAGNOSIS — E1159 Type 2 diabetes mellitus with other circulatory complications: Secondary | ICD-10-CM

## 2021-05-14 DIAGNOSIS — Z794 Long term (current) use of insulin: Secondary | ICD-10-CM

## 2021-05-14 DIAGNOSIS — F17210 Nicotine dependence, cigarettes, uncomplicated: Secondary | ICD-10-CM

## 2021-05-15 ENCOUNTER — Other Ambulatory Visit: Payer: Self-pay

## 2021-05-15 ENCOUNTER — Encounter
Admission: RE | Admit: 2021-05-15 | Discharge: 2021-05-15 | Disposition: A | Discharge status: Home / Self Care | Payer: Medicare Other | Source: Ambulatory Visit | Attending: Orthopedic Surgery | Admitting: Orthopedic Surgery

## 2021-05-15 VITALS — BP 141/80 | HR 76 | Resp 16 | Ht 66.0 in | Wt 166.0 lb

## 2021-05-15 DIAGNOSIS — Z01818 Encounter for other preprocedural examination: Secondary | ICD-10-CM | POA: Insufficient documentation

## 2021-05-15 DIAGNOSIS — K760 Fatty (change of) liver, not elsewhere classified: Secondary | ICD-10-CM | POA: Diagnosis not present

## 2021-05-15 DIAGNOSIS — K7460 Unspecified cirrhosis of liver: Secondary | ICD-10-CM

## 2021-05-15 DIAGNOSIS — K7030 Alcoholic cirrhosis of liver without ascites: Secondary | ICD-10-CM | POA: Insufficient documentation

## 2021-05-15 HISTORY — DX: Anemia, unspecified: D64.9

## 2021-05-15 HISTORY — DX: Other specified health status: Z78.9

## 2021-05-15 HISTORY — DX: Encounter for adoption services: Z02.82

## 2021-05-15 HISTORY — DX: Hyperparathyroidism, unspecified: E21.3

## 2021-05-15 LAB — TYPE AND SCREEN
ABO/RH(D): O POS
Antibody Screen: NEGATIVE

## 2021-05-15 LAB — URINALYSIS, ROUTINE W REFLEX MICROSCOPIC
Bilirubin Urine: NEGATIVE
Glucose, UA: >=500 mg/dL — AB
Hgb urine dipstick: NEGATIVE
Ketones, ur: NEGATIVE mg/dL
Leukocytes,Ua: NEGATIVE
Nitrite: NEGATIVE
Protein, ur: NEGATIVE mg/dL
Specific Gravity, Urine: 1.028 (ref 1.005–1.030)
Squamous Epithelial / HPF: NONE SEEN (ref 0–5)
pH: 5 (ref 5.0–8.0)

## 2021-05-15 LAB — COMPREHENSIVE METABOLIC PANEL
ALT: 26 U/L (ref 0–44)
AST: 47 U/L — ABNORMAL HIGH (ref 15–41)
Albumin: 3.7 g/dL (ref 3.5–5.0)
Alkaline Phosphatase: 342 U/L — ABNORMAL HIGH (ref 38–126)
Anion gap: 4 — ABNORMAL LOW (ref 5–15)
BUN: 16 mg/dL (ref 8–23)
CO2: 28 mmol/L (ref 22–32)
Calcium: 11.6 mg/dL — ABNORMAL HIGH (ref 8.9–10.3)
Chloride: 104 mmol/L (ref 98–111)
Creatinine, Ser: 0.85 mg/dL (ref 0.44–1.00)
GFR, Estimated: >60 mL/min (ref >60)
Glucose, Bld: 125 mg/dL — ABNORMAL HIGH (ref 70–99)
Potassium: 3.9 mmol/L (ref 3.5–5.1)
Sodium: 136 mmol/L (ref 135–145)
Total Bilirubin: 0.8 mg/dL (ref 0.3–1.2)
Total Protein: 8.6 g/dL — ABNORMAL HIGH (ref 6.5–8.1)

## 2021-05-15 LAB — APTT: aPTT: 42 seconds — ABNORMAL HIGH (ref 24–36)

## 2021-05-15 LAB — CBC
HCT: 40.9 % (ref 36.0–46.0)
Hemoglobin: 12.6 g/dL (ref 12.0–15.0)
MCH: 29 pg (ref 26.0–34.0)
MCHC: 30.8 g/dL (ref 30.0–36.0)
MCV: 94 fL (ref 80.0–100.0)
Platelets: 246 10*3/uL (ref 150–400)
RBC: 4.35 MIL/uL (ref 3.87–5.11)
RDW: 17.2 % — ABNORMAL HIGH (ref 11.5–15.5)
WBC: 9.6 10*3/uL (ref 4.0–10.5)
nRBC: 0 % (ref 0.0–0.2)

## 2021-05-15 LAB — PROTIME-INR
INR: 1.1 (ref 0.8–1.2)
Prothrombin Time: 14.5 seconds (ref 11.4–15.2)

## 2021-05-15 LAB — SURGICAL PCR SCREEN
MRSA, PCR: NEGATIVE
Staphylococcus aureus: NEGATIVE

## 2021-05-15 NOTE — Patient Instructions (Addendum)
Your procedure is scheduled on:05-27-21 Monday ?Report to the Registration Desk on the 1st floor of the [facility].Then proceed to the 2nd floor Surgery Desk in the [facility] ?To find out your arrival time, please call [phone] between 1PM - 3PM on:05-25-11 Friday ? ?REMEMBER: ?Instructions that are not followed completely may result in serious medical risk, up to and including death; or upon the discretion of your surgeon and anesthesiologist your surgery may need to be rescheduled. ? ?Do not eat food after midnight the night before surgery.  ?No gum chewing, lozengers or hard candies. ? ?You may however, drink Water up to 2 hours before you are scheduled to arrive for your surgery. Do not drink anything within 2 hours of your scheduled arrival time. ? ?Type 1 and Type 2 diabetics should only drink water. ? ?TAKE THESE MEDICATIONS THE MORNING OF SURGERY WITH A SIP OF WATER: ?-atorvastatin (LIPITOR) ?-omeprazole (PRILOSEC)-take one the night before and one on the morning of surgery - helps to prevent nausea after surgery.) ? ?stop your aspirin 81 MG chewable tablet 7 days prior-Last dose on 05-19-21 ? ?Stop your dapagliflozin propanediol (FARXIGA) 3 days prior to surgery-Last dose on 05-23-21 Thursday ? ?Use your albuterol (PROVENTIL HFA;VENTOLIN HFA) Inhaler the day of surgery and bring inhaler to hospital ? ?One week prior to surgery:Last dose on 05-19-21 ?Stop Anti-inflammatories (NSAIDS) such as Advil, Aleve, Ibuprofen, Motrin, Naproxen, Naprosyn and Aspirin based products such as Excedrin, Goodys Powder, BC Powder.You may however, continue to take Tylenol if needed for pain up until the day of surgery. ? ?Stop ANY OVER THE COUNTER supplements/vitamins 7 days prior to surgery (HAIR/SKIN/NAILS, BLACK CURRANT SEED OIL , VITAMIN D3, ferrous gluconate , FISH OIL ) ? ?No Alcohol for 24 hours before or after surgery. ? ?No Smoking including e-cigarettes for 24 hours prior to surgery.  ?No chewable tobacco products  for at least 6 hours prior to surgery.  ?No nicotine patches on the day of surgery. ? ?Do not use any "recreational" drugs for at least a week prior to your surgery.  ?Please be advised that the combination of cocaine and anesthesia may have negative outcomes, up to and including death. ?If you test positive for cocaine, your surgery will be cancelled. ? ?On the morning of surgery brush your teeth with toothpaste and water, you may rinse your mouth with mouthwash if you wish. ?Do not swallow any toothpaste or mouthwash. ? ?Use CHG Soap as directed on instruction sheet. ? ?Do not wear jewelry, make-up, hairpins, clips or nail polish. ? ?Do not wear lotions, powders, or perfumes.  ? ?Do not shave body from the neck down 48 hours prior to surgery just in case you cut yourself which could leave a site for infection.  ?Also, freshly shaved skin may become irritated if using the CHG soap. ? ?Contact lenses, hearing aids and dentures may not be worn into surgery. ? ?Do not bring valuables to the hospital. [facility] is not responsible for any missing/lost belongings or valuables.  ? ?Notify your doctor if there is any change in your medical condition (cold, fever, infection). ? ?Wear comfortable clothing (specific to your surgery type) to the hospital. ? ?After surgery, you can help prevent lung complications by doing breathing exercises.  ?Take deep breaths and cough every 1-2 hours. Your doctor may order a device called an Incentive Spirometer to help you take deep breaths. ?When coughing or sneezing, hold a pillow firmly against your incision with both hands. This  is called ?splinting.? Doing this helps protect your incision. It also decreases belly discomfort. ? ?If you are being admitted to the hospital overnight, leave your suitcase in the car. ?After surgery it may be brought to your room. ? ?If you are being discharged the day of surgery, you will not be allowed to drive home. ?You will need a responsible adult  (18 years or older) to drive you home and stay with you that night.  ? ?If you are taking public transportation, you will need to have a responsible adult (18 years or older) with you. ?Please confirm with your physician that it is acceptable to use public transportation.  ? ?Please call the [facility] Dept. at [phone] if you have any questions about these instructions. ? ?Surgery Visitation Policy: ? ?Patients undergoing a surgery or procedure may have one family member or support person with them as long as that person is not COVID-19 positive or experiencing its symptoms.  ?That person may remain in the waiting area during the procedure and may rotate out with other people. ? ?Inpatient Visitation:   ? ?Visiting hours are 7 a.m. to 8 p.m. ?Up to two visitors ages 16+ are allowed at one time in a patient room. The visitors may rotate out with other people during the day. Visitors must check out when they leave, or other visitors will not be allowed. One designated support person may remain overnight. ?The visitor must pass COVID-19 screenings, use hand sanitizer when entering and exiting the patient?s room and wear a mask at all times, including in the patient?s room. ?Patients must also wear a mask when staff or their visitor are in the room. ?Masking is required regardless of vaccination status.  ?

## 2021-05-23 ENCOUNTER — Other Ambulatory Visit: Payer: Self-pay

## 2021-05-23 ENCOUNTER — Other Ambulatory Visit
Admission: RE | Admit: 2021-05-23 | Discharge: 2021-05-23 | Disposition: A | Discharge status: Home / Self Care | Payer: Medicare Other | Source: Ambulatory Visit | Attending: Orthopedic Surgery | Admitting: Orthopedic Surgery

## 2021-05-23 DIAGNOSIS — Z20822 Contact with and (suspected) exposure to covid-19: Secondary | ICD-10-CM

## 2021-05-23 DIAGNOSIS — Z01812 Encounter for preprocedural laboratory examination: Secondary | ICD-10-CM | POA: Insufficient documentation

## 2021-05-23 LAB — SARS CORONAVIRUS 2 (TAT 6-24 HRS): SARS Coronavirus 2: NEGATIVE

## 2021-05-27 ENCOUNTER — Encounter
Admission: RE | Disposition: A | Discharge status: Home Health | Payer: Self-pay | Source: Ambulatory Visit | Attending: Orthopedic Surgery

## 2021-05-27 ENCOUNTER — Inpatient Hospital Stay: Payer: Medicare Other

## 2021-05-27 ENCOUNTER — Other Ambulatory Visit: Payer: Self-pay

## 2021-05-27 ENCOUNTER — Inpatient Hospital Stay: Payer: Medicare Other | Admitting: Urgent Care

## 2021-05-27 ENCOUNTER — Inpatient Hospital Stay
Admission: RE | Admit: 2021-05-27 | Discharge: 2021-05-29 | DRG: 470 | Disposition: A | Discharge status: Home Health | Payer: Medicare Other | Source: Ambulatory Visit | Attending: Orthopedic Surgery | Admitting: Orthopedic Surgery

## 2021-05-27 ENCOUNTER — Encounter: Payer: Self-pay | Admitting: Orthopedic Surgery

## 2021-05-27 DIAGNOSIS — Z96641 Presence of right artificial hip joint: Secondary | ICD-10-CM

## 2021-05-27 DIAGNOSIS — Z419 Encounter for procedure for purposes other than remedying health state, unspecified: Secondary | ICD-10-CM

## 2021-05-27 DIAGNOSIS — Z96642 Presence of left artificial hip joint: Secondary | ICD-10-CM | POA: Diagnosis present

## 2021-05-27 DIAGNOSIS — K219 Gastro-esophageal reflux disease without esophagitis: Secondary | ICD-10-CM | POA: Diagnosis present

## 2021-05-27 DIAGNOSIS — J45909 Unspecified asthma, uncomplicated: Secondary | ICD-10-CM | POA: Diagnosis present

## 2021-05-27 DIAGNOSIS — M1611 Unilateral primary osteoarthritis, right hip: Secondary | ICD-10-CM | POA: Diagnosis present

## 2021-05-27 DIAGNOSIS — E119 Type 2 diabetes mellitus without complications: Secondary | ICD-10-CM | POA: Diagnosis present

## 2021-05-27 DIAGNOSIS — I10 Essential (primary) hypertension: Secondary | ICD-10-CM | POA: Diagnosis present

## 2021-05-27 DIAGNOSIS — Z01818 Encounter for other preprocedural examination: Secondary | ICD-10-CM

## 2021-05-27 LAB — CBC
HCT: 40.3 % (ref 36.0–46.0)
Hemoglobin: 12.7 g/dL (ref 12.0–15.0)
MCH: 29.3 pg (ref 26.0–34.0)
MCHC: 31.5 g/dL (ref 30.0–36.0)
MCV: 92.9 fL (ref 80.0–100.0)
Platelets: 224 10*3/uL (ref 150–400)
RBC: 4.34 MIL/uL (ref 3.87–5.11)
RDW: 17.2 % — ABNORMAL HIGH (ref 11.5–15.5)
WBC: 10.7 10*3/uL — ABNORMAL HIGH (ref 4.0–10.5)
nRBC: 0 % (ref 0.0–0.2)

## 2021-05-27 LAB — GLUCOSE, CAPILLARY
Glucose-Capillary: 132 mg/dL — ABNORMAL HIGH (ref 70–99)
Glucose-Capillary: 149 mg/dL — ABNORMAL HIGH (ref 70–99)

## 2021-05-27 SURGERY — ARTHROPLASTY, HIP, TOTAL, ANTERIOR APPROACH
Anesthesia: General | Site: Hip | Laterality: Right

## 2021-05-27 MED ORDER — ATORVASTATIN CALCIUM 20 MG PO TABS
40.0000 mg | ORAL_TABLET | ORAL | Status: DC
  Administered 2021-05-28 – 2021-05-29 (×2): 40 mg via ORAL
  Filled 2021-05-27 (×2): qty 2

## 2021-05-27 MED ORDER — LACTATED RINGERS IV SOLN: INTRAVENOUS | Status: DC

## 2021-05-27 MED ORDER — DOCUSATE SODIUM 100 MG PO CAPS
100.0000 mg | ORAL_CAPSULE | Freq: Two times a day (BID) | ORAL | Status: DC
  Administered 2021-05-27 – 2021-05-29 (×5): 100 mg via ORAL
  Filled 2021-05-27 (×5): qty 1

## 2021-05-27 MED ORDER — ACETAMINOPHEN 500 MG PO TABS
ORAL_TABLET | ORAL | Status: AC
  Administered 2021-05-27: 1000 mg via ORAL
  Filled 2021-05-27: qty 2

## 2021-05-27 MED ORDER — PHENYLEPHRINE HCL (PRESSORS) 10 MG/ML IV SOLN
INTRAVENOUS | Status: AC
  Filled 2021-05-27: qty 1

## 2021-05-27 MED ORDER — GABAPENTIN 300 MG PO CAPS
ORAL_CAPSULE | ORAL | Status: AC
  Administered 2021-05-27: 300 mg via ORAL
  Filled 2021-05-27: qty 1

## 2021-05-27 MED ORDER — ONDANSETRON HCL 4 MG/2ML IJ SOLN
INTRAMUSCULAR | Status: DC | PRN
  Administered 2021-05-27: 4 mg via INTRAVENOUS

## 2021-05-27 MED ORDER — MIDAZOLAM HCL 2 MG/2ML IJ SOLN
INTRAMUSCULAR | Status: AC
  Filled 2021-05-27: qty 2

## 2021-05-27 MED ORDER — FENTANYL CITRATE (PF) 100 MCG/2ML IJ SOLN
INTRAMUSCULAR | Status: DC | PRN
  Administered 2021-05-27 (×2): 50 ug via INTRAVENOUS

## 2021-05-27 MED ORDER — POVIDONE-IODINE 10 % EX SWAB
2.0000 | Freq: Once | CUTANEOUS | Status: AC
Start: 2021-05-27 — End: 2021-05-27
  Administered 2021-05-27: 2 via TOPICAL

## 2021-05-27 MED ORDER — 0.9 % SODIUM CHLORIDE (POUR BTL) OPTIME
TOPICAL | Status: DC | PRN
  Administered 2021-05-27: 1000 mL

## 2021-05-27 MED ORDER — FENTANYL CITRATE (PF) 100 MCG/2ML IJ SOLN: 25.0000 ug | INTRAMUSCULAR | Status: DC | PRN

## 2021-05-27 MED ORDER — ONDANSETRON HCL 4 MG PO TABS: 4.0000 mg | ORAL_TABLET | Freq: Four times a day (QID) | ORAL | Status: DC | PRN

## 2021-05-27 MED ORDER — ACETAMINOPHEN 500 MG PO TABS: 1000.0000 mg | ORAL_TABLET | Freq: Once | ORAL | Status: AC

## 2021-05-27 MED ORDER — BISACODYL 10 MG RE SUPP: 10.0000 mg | Freq: Every day | RECTAL | Status: DC | PRN

## 2021-05-27 MED ORDER — CHLORHEXIDINE GLUCONATE 0.12 % MT SOLN: 15.0000 mL | Freq: Once | OROMUCOSAL | Status: AC

## 2021-05-27 MED ORDER — ACETAMINOPHEN 10 MG/ML IV SOLN: 1000.0000 mg | Freq: Once | INTRAVENOUS | Status: DC | PRN

## 2021-05-27 MED ORDER — KETOROLAC TROMETHAMINE 15 MG/ML IJ SOLN
7.5000 mg | Freq: Four times a day (QID) | INTRAMUSCULAR | Status: AC
  Administered 2021-05-27 – 2021-05-28 (×4): 7.5 mg via INTRAVENOUS
  Filled 2021-05-27 (×4): qty 1

## 2021-05-27 MED ORDER — PROPOFOL 500 MG/50ML IV EMUL
INTRAVENOUS | Status: DC | PRN
  Administered 2021-05-27: 75 ug/kg/min via INTRAVENOUS

## 2021-05-27 MED ORDER — LINAGLIPTIN 5 MG PO TABS
5.0000 mg | ORAL_TABLET | Freq: Every day | ORAL | Status: DC
  Administered 2021-05-27 – 2021-05-29 (×3): 5 mg via ORAL
  Filled 2021-05-27 (×3): qty 1

## 2021-05-27 MED ORDER — PHENYLEPHRINE 40 MCG/ML (10ML) SYRINGE FOR IV PUSH (FOR BLOOD PRESSURE SUPPORT)
PREFILLED_SYRINGE | INTRAVENOUS | Status: AC
  Administered 2021-05-27: 80 ug via INTRAVENOUS
  Filled 2021-05-27: qty 10

## 2021-05-27 MED ORDER — ALBUTEROL SULFATE (2.5 MG/3ML) 0.083% IN NEBU: 3.0000 mL | INHALATION_SOLUTION | Freq: Four times a day (QID) | RESPIRATORY_TRACT | Status: DC | PRN

## 2021-05-27 MED ORDER — PROPOFOL 1000 MG/100ML IV EMUL
INTRAVENOUS | Status: AC
  Filled 2021-05-27: qty 100

## 2021-05-27 MED ORDER — DAPAGLIFLOZIN PROPANEDIOL 10 MG PO TABS
10.0000 mg | ORAL_TABLET | Freq: Every day | ORAL | Status: DC
  Administered 2021-05-27 – 2021-05-29 (×3): 10 mg via ORAL
  Filled 2021-05-27 (×3): qty 1

## 2021-05-27 MED ORDER — ONDANSETRON HCL 4 MG/2ML IJ SOLN: 4.0000 mg | Freq: Four times a day (QID) | INTRAMUSCULAR | Status: DC | PRN

## 2021-05-27 MED ORDER — OXYCODONE HCL 5 MG PO TABS: 5.0000 mg | ORAL_TABLET | Freq: Once | ORAL | Status: DC | PRN

## 2021-05-27 MED ORDER — METOCLOPRAMIDE HCL 10 MG PO TABS: 5.0000 mg | ORAL_TABLET | Freq: Three times a day (TID) | ORAL | Status: DC | PRN

## 2021-05-27 MED ORDER — BUPIVACAINE-EPINEPHRINE (PF) 0.25% -1:200000 IJ SOLN
INTRAMUSCULAR | Status: DC | PRN
  Administered 2021-05-27: 30 mL via PERINEURAL

## 2021-05-27 MED ORDER — PHENYLEPHRINE 40 MCG/ML (10ML) SYRINGE FOR IV PUSH (FOR BLOOD PRESSURE SUPPORT)
80.0000 ug | PREFILLED_SYRINGE | Freq: Once | INTRAVENOUS | Status: AC | PRN
  Administered 2021-05-27: 80 ug via INTRAVENOUS

## 2021-05-27 MED ORDER — FUSION PLUS PO CAPS: 1.0000 | ORAL_CAPSULE | Freq: Every day | ORAL | Status: DC

## 2021-05-27 MED ORDER — PHENYLEPHRINE HCL-NACL 20-0.9 MG/250ML-% IV SOLN
INTRAVENOUS | Status: DC | PRN
  Administered 2021-05-27: 30 ug/min via INTRAVENOUS

## 2021-05-27 MED ORDER — GLYCOPYRROLATE 0.2 MG/ML IJ SOLN
INTRAMUSCULAR | Status: DC | PRN
  Administered 2021-05-27: .2 mg via INTRAVENOUS

## 2021-05-27 MED ORDER — PHENYLEPHRINE HCL (PRESSORS) 10 MG/ML IV SOLN
INTRAVENOUS | Status: DC | PRN
  Administered 2021-05-27: 80 ug via INTRAVENOUS

## 2021-05-27 MED ORDER — BUPIVACAINE HCL (PF) 0.5 % IJ SOLN
INTRAMUSCULAR | Status: DC | PRN
  Administered 2021-05-27: 2.5 mL

## 2021-05-27 MED ORDER — PRONTOSAN WOUND IRRIGATION OPTIME
TOPICAL | Status: DC | PRN
  Administered 2021-05-27: 1 via TOPICAL

## 2021-05-27 MED ORDER — DEXMEDETOMIDINE (PRECEDEX) IN NS 20 MCG/5ML (4 MCG/ML) IV SYRINGE
PREFILLED_SYRINGE | INTRAVENOUS | Status: DC | PRN
  Administered 2021-05-27: 8 ug via INTRAVENOUS
  Administered 2021-05-27: 12 ug via INTRAVENOUS

## 2021-05-27 MED ORDER — LACTATED RINGERS IV SOLN
INTRAVENOUS | Status: DC
Start: 2021-05-27 — End: 2021-05-27

## 2021-05-27 MED ORDER — OXYCODONE HCL 5 MG/5ML PO SOLN: 5.0000 mg | Freq: Once | ORAL | Status: DC | PRN

## 2021-05-27 MED ORDER — ACETAMINOPHEN 325 MG PO TABS: 325.0000 mg | ORAL_TABLET | Freq: Four times a day (QID) | ORAL | Status: DC | PRN

## 2021-05-27 MED ORDER — CEFAZOLIN SODIUM-DEXTROSE 2-4 GM/100ML-% IV SOLN
2.0000 g | INTRAVENOUS | Status: AC
  Administered 2021-05-27: 2 g via INTRAVENOUS

## 2021-05-27 MED ORDER — ALUM & MAG HYDROXIDE-SIMETH 200-200-20 MG/5ML PO SUSP
30.0000 mL | ORAL | Status: DC | PRN
  Filled 2021-05-27: qty 30

## 2021-05-27 MED ORDER — MELOXICAM 7.5 MG PO TABS
ORAL_TABLET | ORAL | Status: AC
  Administered 2021-05-27: 15 mg via ORAL
  Filled 2021-05-27: qty 2

## 2021-05-27 MED ORDER — MENTHOL 3 MG MT LOZG
1.0000 | LOZENGE | OROMUCOSAL | Status: DC | PRN
  Filled 2021-05-27 (×2): qty 9

## 2021-05-27 MED ORDER — PANTOPRAZOLE SODIUM 40 MG PO TBEC
80.0000 mg | DELAYED_RELEASE_TABLET | Freq: Every day | ORAL | Status: DC
  Administered 2021-05-28 – 2021-05-29 (×2): 80 mg via ORAL
  Filled 2021-05-27 (×2): qty 2

## 2021-05-27 MED ORDER — OXYCODONE HCL 5 MG PO TABS
5.0000 mg | ORAL_TABLET | ORAL | Status: DC | PRN
  Administered 2021-05-27 – 2021-05-28 (×2): 10 mg via ORAL
  Filled 2021-05-27: qty 2

## 2021-05-27 MED ORDER — METOCLOPRAMIDE HCL 5 MG/ML IJ SOLN: 5.0000 mg | Freq: Three times a day (TID) | INTRAMUSCULAR | Status: DC | PRN

## 2021-05-27 MED ORDER — HYDROMORPHONE HCL 1 MG/ML IJ SOLN
0.5000 mg | INTRAMUSCULAR | Status: DC | PRN
  Administered 2021-05-27 (×2): 1 mg via INTRAVENOUS
  Filled 2021-05-27 (×2): qty 1

## 2021-05-27 MED ORDER — ORAL CARE MOUTH RINSE: 15.0000 mL | Freq: Once | OROMUCOSAL | Status: AC

## 2021-05-27 MED ORDER — LISINOPRIL 20 MG PO TABS
20.0000 mg | ORAL_TABLET | ORAL | Status: DC
  Administered 2021-05-28 – 2021-05-29 (×2): 20 mg via ORAL
  Filled 2021-05-27 (×2): qty 1

## 2021-05-27 MED ORDER — CEFAZOLIN SODIUM-DEXTROSE 2-4 GM/100ML-% IV SOLN
INTRAVENOUS | Status: AC
  Filled 2021-05-27: qty 100

## 2021-05-27 MED ORDER — FENTANYL CITRATE (PF) 100 MCG/2ML IJ SOLN
INTRAMUSCULAR | Status: AC
  Filled 2021-05-27: qty 2

## 2021-05-27 MED ORDER — PHENOL 1.4 % MT LIQD
1.0000 | OROMUCOSAL | Status: DC | PRN
  Administered 2021-05-27: 1 via OROMUCOSAL
  Filled 2021-05-27 (×2): qty 177

## 2021-05-27 MED ORDER — OXYCODONE HCL 5 MG PO TABS
10.0000 mg | ORAL_TABLET | ORAL | Status: DC | PRN
  Administered 2021-05-27: 15 mg via ORAL
  Administered 2021-05-28 (×3): 10 mg via ORAL
  Administered 2021-05-29: 15 mg via ORAL
  Administered 2021-05-29 (×2): 10 mg via ORAL
  Filled 2021-05-27 (×3): qty 2
  Filled 2021-05-27: qty 3
  Filled 2021-05-27 (×2): qty 2
  Filled 2021-05-27: qty 3
  Filled 2021-05-27: qty 2

## 2021-05-27 MED ORDER — FERROUS GLUCONATE 324 (38 FE) MG PO TABS
324.0000 mg | ORAL_TABLET | Freq: Every day | ORAL | Status: DC
  Administered 2021-05-27 – 2021-05-29 (×3): 324 mg via ORAL
  Filled 2021-05-27 (×3): qty 1

## 2021-05-27 MED ORDER — CEFAZOLIN SODIUM-DEXTROSE 2-4 GM/100ML-% IV SOLN
2.0000 g | Freq: Four times a day (QID) | INTRAVENOUS | Status: AC
  Administered 2021-05-27 (×2): 2 g via INTRAVENOUS
  Filled 2021-05-27 (×2): qty 100

## 2021-05-27 MED ORDER — TRANEXAMIC ACID-NACL 1000-0.7 MG/100ML-% IV SOLN
INTRAVENOUS | Status: AC
  Filled 2021-05-27: qty 100

## 2021-05-27 MED ORDER — TRANEXAMIC ACID-NACL 1000-0.7 MG/100ML-% IV SOLN
1000.0000 mg | INTRAVENOUS | Status: AC
  Administered 2021-05-27: 1000 mg via INTRAVENOUS

## 2021-05-27 MED ORDER — SODIUM CHLORIDE 0.9 % IV SOLN: INTRAVENOUS | Status: DC

## 2021-05-27 MED ORDER — CHLORHEXIDINE GLUCONATE 0.12 % MT SOLN
OROMUCOSAL | Status: AC
  Administered 2021-05-27: 15 mL via OROMUCOSAL
  Filled 2021-05-27: qty 15

## 2021-05-27 MED ORDER — GABAPENTIN 300 MG PO CAPS: 300.0000 mg | ORAL_CAPSULE | Freq: Once | ORAL | Status: AC

## 2021-05-27 MED ORDER — ASPIRIN 81 MG PO CHEW
81.0000 mg | CHEWABLE_TABLET | Freq: Two times a day (BID) | ORAL | Status: DC
  Administered 2021-05-27 – 2021-05-29 (×4): 81 mg via ORAL
  Filled 2021-05-27 (×4): qty 1

## 2021-05-27 MED ORDER — MELOXICAM 7.5 MG PO TABS: 15.0000 mg | ORAL_TABLET | Freq: Once | ORAL | Status: AC

## 2021-05-27 MED ORDER — MAGNESIUM HYDROXIDE 400 MG/5ML PO SUSP: 30.0000 mL | Freq: Every day | ORAL | Status: DC | PRN

## 2021-05-27 MED ORDER — METHOCARBAMOL 1000 MG/10ML IJ SOLN
500.0000 mg | Freq: Four times a day (QID) | INTRAVENOUS | Status: DC | PRN
  Filled 2021-05-27: qty 5

## 2021-05-27 MED ORDER — MIDAZOLAM HCL 5 MG/5ML IJ SOLN
INTRAMUSCULAR | Status: DC | PRN
  Administered 2021-05-27: 2 mg via INTRAVENOUS

## 2021-05-27 MED ORDER — SODIUM CHLORIDE 0.9 % IR SOLN
Status: DC | PRN
  Administered 2021-05-27: 3000 mL

## 2021-05-27 MED ORDER — ONDANSETRON HCL 4 MG/2ML IJ SOLN: 4.0000 mg | Freq: Once | INTRAMUSCULAR | Status: DC | PRN

## 2021-05-27 MED ORDER — DIPHENHYDRAMINE HCL 12.5 MG/5ML PO ELIX: 12.5000 mg | ORAL_SOLUTION | ORAL | Status: DC | PRN

## 2021-05-27 MED ORDER — METHOCARBAMOL 500 MG PO TABS
500.0000 mg | ORAL_TABLET | Freq: Four times a day (QID) | ORAL | Status: DC | PRN
  Administered 2021-05-27: 500 mg via ORAL
  Filled 2021-05-27 (×2): qty 1

## 2021-05-27 MED ORDER — BUPIVACAINE-EPINEPHRINE (PF) 0.25% -1:200000 IJ SOLN
INTRAMUSCULAR | Status: AC
  Filled 2021-05-27: qty 30

## 2021-05-27 SURGICAL SUPPLY — 55 items
BLADE SAGITTAL WIDE XTHICK NO (BLADE) ×2 IMPLANT
BNDG COHESIVE 4X5 TAN ST LF (GAUZE/BANDAGES/DRESSINGS) ×4 IMPLANT
BRUSH SCRUB EZ  4% CHG (MISCELLANEOUS) ×1
BRUSH SCRUB EZ 4% CHG (MISCELLANEOUS) ×1 IMPLANT
CHLORAPREP W/TINT 26 (MISCELLANEOUS) ×2 IMPLANT
COVER HOLE (Hips) ×1 IMPLANT
CUP R3 52MM (Hips) ×1 IMPLANT
DRAPE 3/4 80X56 (DRAPES) ×2 IMPLANT
DRAPE C-ARM 42X72 X-RAY (DRAPES) ×2 IMPLANT
DRAPE STERI IOBAN 125X83 (DRAPES) IMPLANT
DRAPE U-SHAPE 47X51 STRL (DRAPES) ×2 IMPLANT
DRSG AQUACEL AG ADV 3.5X10 (GAUZE/BANDAGES/DRESSINGS) IMPLANT
DRSG AQUACEL AG ADV 3.5X14 (GAUZE/BANDAGES/DRESSINGS) IMPLANT
ELECT REM PT RETURN 9FT ADLT (ELECTROSURGICAL) ×2
ELECTRODE REM PT RTRN 9FT ADLT (ELECTROSURGICAL) ×1 IMPLANT
GAUZE 4X4 16PLY ~~LOC~~+RFID DBL (SPONGE) ×2 IMPLANT
GAUZE XEROFORM 1X8 LF (GAUZE/BANDAGES/DRESSINGS) IMPLANT
GLOVE SURG ENC MOIS LTX SZ8 (GLOVE) ×2 IMPLANT
GLOVE SURG ORTHO LTX SZ8.5 (GLOVE) ×2 IMPLANT
GLOVE SURG UNDER POLY LF SZ8.5 (GLOVE) ×4 IMPLANT
GOWN STRL REUS W/ TWL XL LVL3 (GOWN DISPOSABLE) ×2 IMPLANT
GOWN STRL REUS W/TWL XL LVL3 (GOWN DISPOSABLE) ×2
HEAD FEMORAL TAPER 36MM P0 (Head) ×1 IMPLANT
HOLSTER ELECTROSUGICAL PENCIL (MISCELLANEOUS) ×2 IMPLANT
IV NS 250ML (IV SOLUTION)
IV NS 250ML BAXH (IV SOLUTION) IMPLANT
IV NS IRRIG 3000ML ARTHROMATIC (IV SOLUTION) ×2 IMPLANT
KIT PATIENT CARE HANA TABLE (KITS) ×2 IMPLANT
KIT TURNOVER CYSTO (KITS) ×2 IMPLANT
LINER ACETAB 0 DEG (Liner) ×1 IMPLANT
MANIFOLD NEPTUNE II (INSTRUMENTS) ×2 IMPLANT
MAT ABSORB  FLUID 56X50 GRAY (MISCELLANEOUS) ×1
MAT ABSORB FLUID 56X50 GRAY (MISCELLANEOUS) ×1 IMPLANT
NDL SAFETY ECLIPSE 18X1.5 (NEEDLE) IMPLANT
NDL SPNL 20GX3.5 QUINCKE YW (NEEDLE) ×1 IMPLANT
NEEDLE HYPO 18GX1.5 SHARP (NEEDLE)
NEEDLE HYPO 22GX1.5 SAFETY (NEEDLE) ×2 IMPLANT
NEEDLE SPNL 20GX3.5 QUINCKE YW (NEEDLE) ×2 IMPLANT
PACK HIP PROSTHESIS (MISCELLANEOUS) ×2 IMPLANT
PADDING CAST BLEND 4X4 NS (MISCELLANEOUS) ×4 IMPLANT
PILLOW ABDUCTION MEDIUM (MISCELLANEOUS) ×2 IMPLANT
PULSAVAC PLUS IRRIG FAN TIP (DISPOSABLE) ×2
SCREW 6.5X25MM (Screw) ×1 IMPLANT
SOLUTION PRONTOSAN WOUND 350ML (IRRIGATION / IRRIGATOR) ×2 IMPLANT
SPONGE T-LAP 18X18 ~~LOC~~+RFID (SPONGE) ×8 IMPLANT
STAPLER SKIN PROX 35W (STAPLE) ×2 IMPLANT
STEM POLAR STD SZ4 COLLAR (Stent) ×1 IMPLANT
SUT BONE WAX W31G (SUTURE) ×2 IMPLANT
SUT DVC 2 QUILL PDO  T11 36X36 (SUTURE) ×1
SUT DVC 2 QUILL PDO T11 36X36 (SUTURE) ×1 IMPLANT
SUT VIC AB 2-0 CT1 18 (SUTURE) ×2 IMPLANT
SYR 20ML LL LF (SYRINGE) ×2 IMPLANT
TIP FAN IRRIG PULSAVAC PLUS (DISPOSABLE) ×1 IMPLANT
WAND WEREWOLF FASTSEAL 6.0 (MISCELLANEOUS) ×2 IMPLANT
WATER STERILE IRR 500ML POUR (IV SOLUTION) ×2 IMPLANT

## 2021-05-27 NOTE — H&P (Signed)
The patient has been re-examined, and the chart reviewed, and there have been no interval changes to the documented history and physical.  Plan a right total hip today.  Anesthesia is not consulted regarding a peripheral nerve block for post-operative pain.  The risks, benefits, and alternatives have been discussed at length, and the patient is willing to proceed.    

## 2021-05-27 NOTE — Op Note (Signed)
05/27/2021 ? ?9:26 AM ? ?PATIENT:  Megan [name]  ? ?MRN: [id number] ? ?PRE-OPERATIVE DIAGNOSIS:  Osteoarthritis right hip  ? ?POST-OPERATIVE DIAGNOSIS: Same ? ?Procedure: Right Total Hip Replacement ? ?Surgeon: Elyn Aquas. Harlow Mares, MD  ? ?Assist: Carlynn Spry, PA-C ? ?Anesthesia: Spinal  ? ?EBL: 200 mL  ? ?Specimens: None  ? ?Drains: None  ? ?Components used: A size 4 Polarstem Smith and Nephew, R3 size 52 mm shell, and a 36 mm +0 mm head  ? ? ?Description of the procedure in detail: After informed consent was obtained and the appropriate extremity marked in the pre-operative holding area, the patient was taken to the operating room and placed in the supine position on the fracture table. All pressure points were well padded and bilateral lower extremities were place in traction spars. The hip was prepped and draped in standard sterile fashion. A spinal anesthetic had been delivered by the anesthesia team. The skin and subcutaneous tissues were injected with a mixture of Marcaine with epinephrine for post-operative pain. A longitudinal incision approximately 10 cm in length was carried out from the anterior superior iliac spine to the greater trochanter. The tensor fascia was divided and blunt dissection was taken down to the level of the joint capsule. The lateral circumflex vessels were cauterized. Deep retractors were placed and a portion of the anterior capsule was excised. Using fluoroscopy the neck cut was planned and carried out with a sagittal saw. The head was passed from the field with use of a corkscrew and hip skid. Deep retractors were placed along the acetabulum and the degenerative labrum and large osteophytes were removed with a Rongeur. The cup was sequentially reamed to a size 52 mm. The wound was irrigated and using fluoroscopy the size 52 mm cup was impacted in to anatomic position. A single screw was placed followed by a threaded hole cover. The final liner was impacted in to position.  Attention was then turned to the proximal femur. The leg was placed in extension and external rotation. The canal was opened and sequentially broached to a size 4. The trial components were placed and the hip relocated. The components were found to be in good position using fluoroscopy. The hip was dislocated and the trial components removed. The final components were impacted in to position and the hip relocated. The final components were again check with fluoroscopy and found to be in good position. Hemostasis was achieved with electrocautery. The deep capsule was injected with Marcaine and epinephrine. The wound was irrigated with bacitracin laced normal saline and the tensor fascia closed with #2 Quill suture. The subcutaneous tissues were closed with 2-0 vicryl and staples for the skin. A sterile dressing was applied and an abduction pillow. Patient tolerated the procedure well and there were no apparent complication. Patient was taken to the recovery room in good condition.  ? ?Kurtis Bushman, MD ? ? ?

## 2021-05-27 NOTE — Transfer of Care (Signed)
Immediate Anesthesia Transfer of Care Note ? ?Patient: Megan [name] ? ?Procedure(s) Performed: TOTAL HIP ARTHROPLASTY ANTERIOR APPROACH (Right: Hip) ? ?Patient Location: PACU ? ?Anesthesia Type:Spinal ? ?Level of Consciousness: drowsy ? ?Airway & Oxygen Therapy: Patient Spontanous Breathing and Patient connected to face mask oxygen ? ?Post-op Assessment: Report given to RN ? ?Post vital signs: stable ? ?Last Vitals:  ?Vitals Value Taken Time  ?BP 81/53 05/27/21 0930  ?Temp    ?Pulse 86 05/27/21 0932  ?Resp 26 05/27/21 0932  ?SpO2 96 % 05/27/21 0932  ?Vitals shown include unvalidated device data. ? ?Last Pain:  ?Vitals:  ? 05/27/21 0657  ?TempSrc: Temporal  ?PainSc: 1   ?   ? ?  ? ?Complications: No notable events documented. ?

## 2021-05-27 NOTE — Anesthesia Procedure Notes (Signed)
Spinal ? ?Patient location during procedure: OR ?Start time: 05/27/2021 7:47 AM ?Reason for block: surgical anesthesia ?Staffing ?Performed: resident/CRNA  ?Preanesthetic Checklist ?Completed: patient identified, IV checked, site marked, risks and benefits discussed, surgical consent, monitors and equipment checked, pre-op evaluation and timeout performed ?Spinal Block ?Patient position: sitting ?Prep: DuraPrep ?Patient monitoring: heart rate, cardiac monitor, continuous pulse ox and blood pressure ?Approach: midline ?Location: L3-4 ?Injection technique: single-shot ?Needle ?Needle type: Sprotte  ?Needle gauge: 24 G ?Needle length: 9 cm ?Assessment ?Sensory level: T4 ?Events: CSF return ?Additional Notes ?No pain with injection.  Negative heme, negative paresthesia.  Good free flow CSF pre/post.  Attempt x1. Atraumatic.  ? ? ? ?

## 2021-05-27 NOTE — Progress Notes (Signed)
?   05/27/21 0730  ?Clinical Encounter Type  ?Visited With Patient  ?Visit Type Initial;Pre-op  ?Spiritual Encounters  ?Spiritual Needs Prayer  ? ?Chaplain provided support through compassionate presence, conversation and prayer ?

## 2021-05-27 NOTE — TOC Progression Note (Addendum)
Transition of Care (TOC) - Progression Note  ? ? ?Patient Details  ?Name: Megan [name] ?MRN: [id number] ?Date of Birth: Jun 18, 1955 ? ?Transition of Care (TOC) CM/SW Contact  ?Conception Oms, RN ?Phone Number: ?05/27/2021, 4:09 PM ? ?Clinical Narrative:   Met with the patient at the bedside, she lives at home with her daughter and her daughter works from home, She has a Chartered certified accountant and a 3 in1  and wants a hospital bed, her bedroom is up 16 stairs and she needs it set up down stairs ? ?I reached out to [place] with [facility] and asked them to accept the patient for [facility], awaiting a response ? ? [facility] will accept the patient for [facility] ?  ? ?Expected Discharge Plan and Services ?  ?  ?  ?  ?  ?                ?  ?  ?  ?  ?  ?  ?  ?  ?  ?  ? ? ?Social Determinants of Health (SDOH) Interventions ?  ? ?Readmission Risk Interventions ?No flowsheet data found. ? ?

## 2021-05-27 NOTE — Anesthesia Postprocedure Evaluation (Signed)
Anesthesia Post Note ? ?Patient: Megan [name] ? ?Procedure(s) Performed: TOTAL HIP ARTHROPLASTY ANTERIOR APPROACH (Right: Hip) ? ?Patient location during evaluation: PACU ?Anesthesia Type: Spinal and General ?Level of consciousness: awake and alert, oriented and patient cooperative ?Pain management: pain level controlled ?Vital Signs Assessment: post-procedure vital signs reviewed and stable ?Respiratory status: spontaneous breathing, nonlabored ventilation and respiratory function stable ?Cardiovascular status: blood pressure returned to baseline and stable ?Postop Assessment: adequate PO intake ?Anesthetic complications: no ? ? ?No notable events documented. ? ? ?Last Vitals:  ?Vitals:  ? 05/27/21 1052 05/27/21 1100  ?BP: (!) 93/59 99/64  ?Pulse: 66 76  ?Resp: 13 20  ?Temp:    ?SpO2: 94% 96%  ?  ?Last Pain:  ?Vitals:  ? 05/27/21 1100  ?TempSrc:   ?PainSc: 0-No pain  ? ? ?  ?  ?  ?  ?  ?  ? ?Darrin Nipper ? ? ? ? ?

## 2021-05-27 NOTE — Evaluation (Signed)
Physical Therapy Evaluation ?Patient Details ?Name: Megan [name] ?MRN: [id number] ?DOB: 12/03/1955 ?Today's Date: 05/27/2021 ? ?History of Present Illness ? Pt is 66 y.o. female diagnosed w/ osteoarthritis of right hip and is s/p elective R anterior THA. PmHx: HTN, GERD, Alcohol Abuse, DM, Asthma, and L THA (01/11/21). ?  ?Clinical Impression ? Pt awake, resting in bed upon PT entrance into room for evaluation today. Pt is A&Ox4 and reports current hip pain is 8/10 at rest. She reports she lives in a 2-story home w/ her daughter and grandson w/ 1 STE and 15 to get to 2nd level; is independent w/ ADLs and does not require a RW at baseline, but has one from previous L THA. ? ?Pt is able to perform all LE bed exercises (see "General LE Exercise" section) w/o increase in negative symptoms. Once completed she performs bed mobility w/ minA; provided due to increase RLE pain and weakness. She is able to sit EOB w/ SUPERVISION. Once seated EOB patient is able to progress to standing w/ CGA using RW; and is able to complete a step-pivot transfer to recliner w/ CGA using RW. Further mobility/ambulation was deferred due to Pt's pain level and her requesting to not continue with anything further due to pain. Pt will benefit from continued skilled PT in order to increase LE strength/mobility, improve gait, and restore PLOF. Current discharge recommendation to HHPT is appropriate due to the level of assistance required by the patient to ensure safety and improve overall function. ? ?   ? ?Recommendations for follow up therapy are one component of a multi-disciplinary discharge planning process, led by the attending physician.  Recommendations may be updated based on patient status, additional functional criteria and insurance authorization. ? ?Follow Up Recommendations Home health PT ? ?  ?Assistance Recommended at Discharge Intermittent Supervision/Assistance  ?Patient can return home with the following ? A little help with  walking and/or transfers;A little help with bathing/dressing/bathroom;Assistance with cooking/housework;Assist for transportation;Help with stairs or ramp for entrance ? ?  ?Equipment Recommendations None recommended by PT  ?Recommendations for Other Services ? OT consult  ?  ?Functional Status Assessment Patient has had a recent decline in their functional status and demonstrates the ability to make significant improvements in function in a reasonable and predictable amount of time.  ? ?  ?Precautions / Restrictions Precautions ?Precautions: Fall;Anterior Hip ?Precaution Booklet Issued: Yes (comment) ?Restrictions ?Weight Bearing Restrictions: Yes ?RLE Weight Bearing: Weight bearing as tolerated  ? ?  ? ?Mobility ? Bed Mobility ?Overal bed mobility: Needs Assistance ?Bed Mobility: Supine to Sit ?  ?  ?Supine to sit: Min assist ?  ?  ?  ?  ? ?Transfers ?Overall transfer level: Needs assistance ?Equipment used: Rolling walker (2 wheels) ?Transfers: Sit to/from Stand, Bed to chair/wheelchair/BSC ?Sit to Stand: Min guard ?  ?Step pivot transfers: Min guard ?  ?  ?  ?  ?  ? ?Ambulation/Gait ?  ?  ?  ?  ?  ?  ?  ?  ? ?Stairs ?  ?  ?  ?  ?  ? ?Wheelchair Mobility ?  ? ?Modified Rankin (Stroke Patients Only) ?  ? ?  ? ?Balance Overall balance assessment: Needs assistance ?Sitting-balance support: Feet supported, Bilateral upper extremity supported ?Sitting balance-Leahy Scale: Good ?  ?  ?Standing balance support: Bilateral upper extremity supported, Reliant on assistive device for balance, During functional activity ?Standing balance-Leahy Scale: Fair ?  ?  ?  ?  ?  ?  ?  ?  ?  ?  ?  ?  ?   ? ? ? ?  Pertinent Vitals/Pain Pain Assessment ?Pain Assessment: 0-10 ?Pain Score: 8  ?Pain Descriptors / Indicators: Aching, Discomfort, Grimacing, Moaning, Throbbing ?Pain Intervention(s): Limited activity within patient's tolerance, Monitored during session, Repositioned  ? ? ?Home Living Family/patient expects to be discharged to::  Private residence ?Living Arrangements: Children ?Available Help at Discharge: Family;Available 24 hours/day ?Type of Home: House ?Home Access: Stairs to enter ?Entrance Stairs-Rails: None ?Entrance Stairs-Number of Steps: 1 ?Alternate Level Stairs-Number of Steps: 15 ?Home Layout: Two level;Bed/bath upstairs ?Home Equipment: Conservation officer, nature (2 wheels);Cane - single point ?   ?  ?Prior Function Prior Level of Function : Independent/Modified Independent ?  ?  ?  ?  ?  ?  ?Mobility Comments: Ind amb without an AD community distances, no fall history, Ind with ADLs, does not drive, uses community transportation services. Family assist with IADL management including shopping, etc. ?  ?  ? ? ?Hand Dominance  ?   ? ?  ?Extremity/Trunk Assessment  ? Upper Extremity Assessment ?Upper Extremity Assessment: Overall WFL for tasks assessed ?  ? ?Lower Extremity Assessment ?Lower Extremity Assessment: Overall WFL for tasks assessed;Generalized weakness;RLE deficits/detail ?RLE Deficits / Details: weakness due to THA ?  ? ?   ?Communication  ?    ?Cognition Arousal/Alertness: Awake/alert ?Behavior During Therapy: [facility] for tasks assessed/performed ?Overall Cognitive Status: Within Functional Limits for tasks assessed ?  ?  ?  ?  ?  ?  ?  ?  ?  ?  ?  ?  ?  ?  ?  ?  ?  ?  ?  ? ?  ?General Comments   ? ?  ?Exercises Total Joint Exercises ?Ankle Circles/Pumps: AROM, Both, 10 reps ?Quad Sets: AROM, Both, 10 reps ?Gluteal Sets: AROM, Both, 10 reps  ? ?Assessment/Plan  ?  ?PT Assessment Patient needs continued PT services  ?PT Problem List Decreased strength;Decreased mobility;Decreased range of motion;Decreased coordination;Decreased activity tolerance;Decreased balance;Pain ? ?   ?  ?PT Treatment Interventions DME instruction;Therapeutic exercise;Gait training;Balance training;Stair training;Neuromuscular re-education;Functional mobility training;Therapeutic activities;Patient/family education   ? ?PT Goals (Current goals can be found in  the Care Plan section)  ?Acute Rehab PT Goals ?Patient Stated Goal: to get better in order to go home ?PT Goal Formulation: With patient ?Time For Goal Achievement: 06/10/21 ?Potential to Achieve Goals: Good ? ?  ?Frequency BID ?  ? ? ?Co-evaluation   ?  ?  ?  ?  ? ? ?  ?AM-PAC PT "6 Clicks" Mobility  ?Outcome Measure Help needed turning from your back to your side while in a flat bed without using bedrails?: A Little ?Help needed moving from lying on your back to sitting on the side of a flat bed without using bedrails?: A Little ?Help needed moving to and from a bed to a chair (including a wheelchair)?: A Little ?Help needed standing up from a chair using your arms (e.g., wheelchair or bedside chair)?: A Little ?Help needed to walk in hospital room?: A Lot ?Help needed climbing 3-5 steps with a railing? : A Lot ?6 Click Score: 16 ? ?  ?End of Session Equipment Utilized During Treatment: Gait belt ?Activity Tolerance: Patient tolerated treatment well;Patient limited by pain ?Patient left: in chair;with chair alarm set;with call bell/phone within reach ?Nurse Communication: Mobility status ?PT Visit Diagnosis: Pain;Muscle weakness (generalized) (M62.81) ?Pain - Right/Left: Right ?Pain - part of body: Hip ?  ? ?Time: 9323-5573 ?PT Time Calculation (min) (ACUTE ONLY): 17 min ? ? ?Charges:     ?  ?  ?   ?  Jonnie Kind, SPT ?05/27/2021, 3:50 PM ? ?

## 2021-05-27 NOTE — Anesthesia Preprocedure Evaluation (Addendum)
Anesthesia Evaluation  ?Patient identified by MRN, date of birth, ID band ?Patient awake ? ? ? ?Reviewed: ?Allergy & Precautions, NPO status , Patient's Chart, lab work & pertinent test results ? ?History of Anesthesia Complications ?Negative for: history of anesthetic complications ? ?Airway ?Mallampati: I ? ? ?Neck ROM: Full ? ? ? Dental ? ? ?8 teeth remaining, some chipped:   ?Pulmonary ?asthma , Current SmokerPatient did not abstain from smoking.,  ?Chronic bronchitis ?  ?Pulmonary exam normal ?breath sounds clear to auscultation ? ? ? ? ? ? Cardiovascular ?hypertension, Normal cardiovascular exam ?Rhythm:Regular Rate:Normal ? ?ECG 05/15/21: normal ?  ?Neuro/Psych ?Hx alcohol use disorder, quit since 09/16/20 ?  ? GI/Hepatic ?GERD  ,(+) Cirrhosis  ?  ?  ? ,   ?Endo/Other  ?diabetes, Type 2 ? Renal/GU ?negative Renal ROS  ? ?  ?Musculoskeletal ? ?(+) Arthritis ,  ? Abdominal ?  ?Peds ? Hematology ?negative hematology ROS ?(+)   ?Anesthesia Other Findings ? ? Reproductive/Obstetrics ? ?  ? ? ? ? ? ? ? ? ? ? ? ? ? ?  ?  ? ? ? ? ? ? ? ? ?Anesthesia Physical ? ?Anesthesia Plan ? ?ASA: 2 ? ?Anesthesia Plan: General and Spinal  ? ?Post-op Pain Management:   ? ?Induction: Intravenous ? ?PONV Risk Score and Plan: 2 and Propofol infusion, TIVA, Treatment may vary due to age or medical condition and Ondansetron ? ?Airway Management Planned: Natural Airway and Nasal Cannula ? ?Additional Equipment:  ? ?Intra-op Plan:  ? ?Post-operative Plan:  ? ?Informed Consent: I have reviewed the patients History and Physical, chart, labs and discussed the procedure including the risks, benefits and alternatives for the proposed anesthesia with the patient or authorized representative who has indicated his/her understanding and acceptance.  ? ? ? ? ? ?Plan Discussed with: CRNA ? ?Anesthesia Plan Comments: (Plan for spinal and GA with natural airway, LMA/GETA backup.  Patient consented for risks of anesthesia  including but not limited to:  ?- adverse reactions to medications ?- damage to eyes, teeth, lips or other oral mucosa ?- nerve damage due to positioning  ?- sore throat or hoarseness ?- headache, bleeding, infection, nerve damage 2/2 spinal ?- damage to heart, brain, nerves, lungs, other parts of body or loss of life ? ?Informed patient about role of CRNA in peri- and intra-operative care.  Patient voiced understanding.)  ? ? ? ? ? ? ?Anesthesia Quick Evaluation ? ?

## 2021-05-27 NOTE — Progress Notes (Cosign Needed)
Patient has Hip surgery which requires lower body to be positioned in ways not feasible with a normal bed. .  Hip Pain frequently requires frequent changes in body position which cannot be achieved with a normal bed.  ?

## 2021-05-28 ENCOUNTER — Encounter: Payer: Self-pay | Admitting: Orthopedic Surgery

## 2021-05-28 LAB — SURGICAL PATHOLOGY

## 2021-05-28 LAB — BASIC METABOLIC PANEL
Anion gap: 4 — ABNORMAL LOW (ref 5–15)
BUN: 18 mg/dL (ref 8–23)
CO2: 25 mmol/L (ref 22–32)
Calcium: 11.3 mg/dL — ABNORMAL HIGH (ref 8.9–10.3)
Chloride: 103 mmol/L (ref 98–111)
Creatinine, Ser: 0.94 mg/dL (ref 0.44–1.00)
GFR, Estimated: >60 mL/min (ref >60)
Glucose, Bld: 180 mg/dL — ABNORMAL HIGH (ref 70–99)
Potassium: 4.9 mmol/L (ref 3.5–5.1)
Sodium: 132 mmol/L — ABNORMAL LOW (ref 135–145)

## 2021-05-28 LAB — CBC
HCT: 38.8 % (ref 36.0–46.0)
Hemoglobin: 12.1 g/dL (ref 12.0–15.0)
MCH: 29.3 pg (ref 26.0–34.0)
MCHC: 31.2 g/dL (ref 30.0–36.0)
MCV: 93.9 fL (ref 80.0–100.0)
Platelets: 225 10*3/uL (ref 150–400)
RBC: 4.13 MIL/uL (ref 3.87–5.11)
RDW: 16.8 % — ABNORMAL HIGH (ref 11.5–15.5)
WBC: 14.6 10*3/uL — ABNORMAL HIGH (ref 4.0–10.5)
nRBC: 0 % (ref 0.0–0.2)

## 2021-05-28 MED ORDER — ASPIRIN 81 MG PO CHEW: 81.0000 mg | CHEWABLE_TABLET | Freq: Two times a day (BID) | ORAL | 0 refills | Status: DC

## 2021-05-28 MED ORDER — METHOCARBAMOL 500 MG PO TABS: 500.0000 mg | ORAL_TABLET | Freq: Four times a day (QID) | ORAL | 0 refills | Status: DC | PRN

## 2021-05-28 MED ORDER — OXYCODONE HCL 5 MG PO TABS: 5.0000 mg | ORAL_TABLET | ORAL | 0 refills | Status: DC | PRN

## 2021-05-28 NOTE — Plan of Care (Signed)
Patient sleeping between care. Surgical dressing C/D/I. Pain controlled. Call bell within reach. ? ? ?Problem: Education: ?Goal: Knowledge of General Education information will improve ?Description: Including pain rating scale, medication(s)/side effects and non-pharmacologic comfort measures ?Outcome: Progressing ?  ?Problem: Health Behavior/Discharge Planning: ?Goal: Ability to manage health-related needs will improve ?Outcome: Progressing ?  ?Problem: Clinical Measurements: ?Goal: Ability to maintain clinical measurements within normal limits will improve ?Outcome: Progressing ?Goal: Will remain free from infection ?Outcome: Progressing ?Goal: Diagnostic test results will improve ?Outcome: Progressing ?Goal: Respiratory complications will improve ?Outcome: Progressing ?Goal: Cardiovascular complication will be avoided ?Outcome: Progressing ?  ?Problem: Activity: ?Goal: Risk for activity intolerance will decrease ?Outcome: Progressing ?  ?Problem: Nutrition: ?Goal: Adequate nutrition will be maintained ?Outcome: Progressing ?  ?Problem: Coping: ?Goal: Level of anxiety will decrease ?Outcome: Progressing ?  ?Problem: Elimination: ?Goal: Will not experience complications related to bowel motility ?Outcome: Progressing ?Goal: Will not experience complications related to urinary retention ?Outcome: Progressing ?  ?Problem: Pain Managment: ?Goal: General experience of comfort will improve ?Outcome: Progressing ?  ?Problem: Safety: ?Goal: Ability to remain free from injury will improve ?Outcome: Progressing ?  ?Problem: Skin Integrity: ?Goal: Risk for impaired skin integrity will decrease ?Outcome: Progressing ?  ?

## 2021-05-28 NOTE — Discharge Instructions (Signed)

## 2021-05-28 NOTE — Progress Notes (Signed)
?  Subjective: ? ?Patient reports pain as mild to moderate.   ? ?Objective:  ? ?VITALS:   ?Vitals:  ? 05/27/21 1456 05/27/21 2020 05/27/21 2359 05/28/21 0444  ?BP: 103/68 (!) 182/95 (!) 148/70 (!) 141/78  ?Pulse: 75 94 (!) 106 (!) 105  ?Resp: '16 18 16 17  '$ ?Temp: 98.3 ?F (36.8 ?C) 99 ?F (37.2 ?C) 99.3 ?F (37.4 ?C) 99.6 ?F (37.6 ?C)  ?TempSrc:      ?SpO2: 93% 94% 92% 93%  ?Weight:      ?Height:      ? ? ?PHYSICAL EXAM: ? ?Neurologically intact ?ABD soft ?Neurovascular intact ?Sensation intact distally ?Intact pulses distally ?Dorsiflexion/Plantar flexion intact ?Incision: dressing C/D/I ?No cellulitis present ?Compartment soft ? ?LABS ? ?Results for orders placed or performed during the hospital encounter of 05/27/21 (from the past 24 hour(s))  ?Glucose, capillary     Status: Abnormal  ? Collection Time: 05/27/21  9:37 AM  ?Result Value Ref Range  ? Glucose-Capillary 149 (H) 70 - 99 mg/dL  ?CBC     Status: Abnormal  ? Collection Time: 05/28/21  3:53 AM  ?Result Value Ref Range  ? WBC 14.6 (H) 4.0 - 10.5 K/uL  ? RBC 4.13 3.87 - 5.11 MIL/uL  ? Hemoglobin 12.1 12.0 - 15.0 g/dL  ? HCT 38.8 36.0 - 46.0 %  ? MCV 93.9 80.0 - 100.0 fL  ? MCH 29.3 26.0 - 34.0 pg  ? MCHC 31.2 30.0 - 36.0 g/dL  ? RDW 16.8 (H) 11.5 - 15.5 %  ? Platelets 225 150 - 400 K/uL  ? nRBC 0.0 0.0 - 0.2 %  ?Basic metabolic panel     Status: Abnormal  ? Collection Time: 05/28/21  3:53 AM  ?Result Value Ref Range  ? Sodium 132 (L) 135 - 145 mmol/L  ? Potassium 4.9 3.5 - 5.1 mmol/L  ? Chloride 103 98 - 111 mmol/L  ? CO2 25 22 - 32 mmol/L  ? Glucose, Bld 180 (H) 70 - 99 mg/dL  ? BUN 18 8 - 23 mg/dL  ? Creatinine, Ser 0.94 0.44 - 1.00 mg/dL  ? Calcium 11.3 (H) 8.9 - 10.3 mg/dL  ? GFR, Estimated >60 >60 mL/min  ? Anion gap 4 (L) 5 - 15  ? ? ?DG C-Arm 1-60 Min-No Report ? ?Result Date: 05/27/2021 ?Fluoroscopy was utilized by the requesting physician.  No radiographic interpretation.  ? ?DG C-Arm 1-60 Min-No Report ? ?Result Date: 05/27/2021 ?Fluoroscopy was utilized  by the requesting physician.  No radiographic interpretation.  ? ?DG HIP UNILAT WITH PELVIS 1V RIGHT ? ?Result Date: 05/27/2021 ?CLINICAL DATA:  Operative fluoroscopy right anterior approach total hip arthroplasty. EXAM: DG HIP (WITH OR WITHOUT PELVIS) 1V RIGHT COMPARISON:  None. FINDINGS: Images were performed intraoperatively without the presence of a radiologist. Total fluoroscopic images: 3 Total fluoro time: 8 seconds The patient appears to be undergoing total right hip arthroplasty. Prior total left hip arthroplasty is noted. Please see intraoperative findings for further detail. IMPRESSION: Fluoroscopy for total right hip arthroplasty. Electronically Signed   By: Yvonne Kendall M.D.   On: 05/27/2021 09:58   ? ?Assessment/Plan: ?1 Day Post-Op  ? ?Principal Problem: ?  History of total hip replacement, right ? ? ?Advance diet ?Up with therapy ?Discharge tomorrow with HHPT ? ? ?Carlynn Spry , PA-C ?05/28/2021, 8:26 AM ? ? ? ? ?  ?

## 2021-05-28 NOTE — Progress Notes (Signed)
Physical Therapy Treatment ?Patient Details ?Name: Megan [name] ?MRN: [id number] ?DOB: Mar 13, 1956 ?Today's Date: 05/28/2021 ? ? ?History of Present Illness Pt is 66 y.o. female diagnosed w/ osteoarthritis of right hip and is s/p elective R anterior THA. PmHx: HTN, GERD, Alcohol Abuse, DM, Asthma, and L THA (01/11/21). ? ?  ?PT Comments ? Pt is awake and alert resting in bed upon PT entrance into room today. Pt does not report a quantitative pain value at rest, but displays grimacing and moaning throughout session. She performs all bed mobility w/ minA; provided due RLE pain/weakness. Once seated EOB she is able to progress to standing w/ CGA using RW to ambulate to bathroom after requesting to attempt to use it this afternoon. After attempting, she was able to ambulate another ~76f w/ CGA using RW and returned to bed w/ no increase in negative symptoms throughout session. Pt will benefit from continued skilled PT in order to increase LE strength, improve mobility/gait, decrease c/o pain, and restore PLOF. Current discharge recommendation remains appropriate due to the level of assistance required by the patient to ensure safety and improve overall function. ?  ?Recommendations for follow up therapy are one component of a multi-disciplinary discharge planning process, led by the attending physician.  Recommendations may be updated based on patient status, additional functional criteria and insurance authorization. ? ?Follow Up Recommendations ? Home health PT ?  ?  ?Assistance Recommended at Discharge Intermittent Supervision/Assistance  ?Patient can return home with the following A little help with walking and/or transfers;A little help with bathing/dressing/bathroom;Assistance with cooking/housework;Assist for transportation;Help with stairs or ramp for entrance ?  ?Equipment Recommendations ? None recommended by PT  ?  ?Recommendations for Other Services   ? ? ?  ?Precautions / Restrictions  Precautions ?Precautions: Fall;Anterior Hip ?Precaution Booklet Issued: Yes (comment) ?Restrictions ?Weight Bearing Restrictions: Yes ?RLE Weight Bearing: Weight bearing as tolerated  ?  ? ?Mobility ? Bed Mobility ?Overal bed mobility: Needs Assistance ?Bed Mobility: Supine to Sit, Sit to Supine ?  ?  ?Supine to sit: Min assist ?Sit to supine: Min assist ?  ?General bed mobility comments: minA for RLE transfer ?  ? ?Transfers ?Overall transfer level: Needs assistance ?Equipment used: Rolling walker (2 wheels) ?Transfers: Sit to/from Stand ?Sit to Stand: Min guard ?  ?  ?  ?  ?  ?  ?  ? ?Ambulation/Gait ?Ambulation/Gait assistance: Min guard ?Gait Distance (Feet): 80 Feet ?Assistive device: Rolling walker (2 wheels) ?Gait Pattern/deviations: Step-to pattern, Decreased step length - right, Decreased step length - left, Decreased stride length ?Gait velocity: decreased ?  ?  ?  ? ? ?Stairs ?  ?  ?  ?  ?  ? ? ?Wheelchair Mobility ?  ? ?Modified Rankin (Stroke Patients Only) ?  ? ? ?  ?Balance Overall balance assessment: Needs assistance ?Sitting-balance support: Feet supported, Bilateral upper extremity supported ?Sitting balance-Leahy Scale: Good ?  ?  ?Standing balance support: Bilateral upper extremity supported, Reliant on assistive device for balance, During functional activity ?Standing balance-Leahy Scale: Fair ?  ?  ?  ?  ?  ?  ?  ?  ?  ?  ?  ?  ?  ? ?  ?Cognition Arousal/Alertness: Awake/alert ?Behavior [facility]: [facility] tasks assessed/performed ?Overall Cognitive Status: Within Functional Limits for tasks assessed ?  ?  ?  ?  ?  ?  ?  ?  ?  ?  ?  ?  ?  ?  ?  ?  ?  ?  ?  ? ?  ?  Exercises   ? ?  ?General Comments   ?  ?  ? ?Pertinent Vitals/Pain Pain Assessment ?Pain Assessment: Faces ?Pain Score: 7  ?Faces Pain Scale: Hurts little more ?Pain Descriptors / Indicators: Aching, Discomfort, Grimacing, Moaning, Throbbing ?Pain Intervention(s): Limited activity within patient's tolerance, Monitored during  session, Repositioned  ? ? ?Home Living   ?  ?  ?  ?  ?  ?  ?  ?  ?  ?   ?  ?Prior Function    ?  ?  ?   ? ?PT Goals (current goals can now be found in the care plan section) Progress towards PT goals: Progressing toward goals ? ?  ?Frequency ? ? ? BID ? ? ? ?  ?PT Plan Current plan remains appropriate  ? ? ?Co-evaluation   ?  ?  ?  ?  ? ?  ?AM-PAC PT "6 Clicks" Mobility   ?Outcome Measure ? Help needed turning from your back to your side while in a flat bed without using bedrails?: A Little ?Help needed moving from lying on your back to sitting on the side of a flat bed without using bedrails?: A Little ?Help needed moving to and from a bed to a chair (including a wheelchair)?: A Little ?Help needed standing up from a chair using your arms (e.g., wheelchair or bedside chair)?: A Little ?Help needed to walk in hospital room?: A Little ?Help needed climbing 3-5 steps with a railing? : A Lot ?6 Click Score: 17 ? ?  ?End of Session Equipment Utilized During Treatment: Gait belt ?Activity Tolerance: Patient tolerated treatment well;Patient limited by pain ?Patient left: with call bell/phone within reach;in bed;with bed alarm set ?Nurse Communication: Mobility status ?PT Visit Diagnosis: Pain;Muscle weakness (generalized) (M62.81) ?Pain - Right/Left: Right ?Pain - part of body: Hip ?  ? ? ?Time: 9562-1308 ?PT Time Calculation (min) (ACUTE ONLY): 20 min ? ?Charges:  $Therapeutic Exercise: 8-22 mins ?$Therapeutic Activity: 8-22 mins          ?          ? ? ?Jonnie Kind, SPT ?05/28/2021, 3:33 PM ? ?

## 2021-05-28 NOTE — Discharge Summary (Addendum)
Physician Discharge Summary  ?Patient ID: ?Megan [name] ?MRN: [id number] ?DOB/AGE: [birth year] 66 y.o. ? ?Admit date: 05/27/2021 ?Discharge date: 05/29/2021 ? ?Admission Diagnoses:  ?M16.11 Unilateral primary osteoarthritis, right hip ?History of total hip replacement, right ? ?Discharge Diagnoses:  ?M16.11 Unilateral primary osteoarthritis, right hip ?Principal Problem: ?  History of total hip replacement, right ? ? ?Past Medical History:  ?Diagnosis Date  ? Adopted   ? Anemia   ? Arthritis   ? Asthma   ? well controlled  ? Cirrhosis of liver (Megan [name]) 2022  ? Diabetes mellitus without complication (Megan [name])   ? GERD (gastroesophageal reflux disease)   ? Hepatitis   ? per patient was diagnosed with cirrosis due to drinking alcohol but stopped 09/16/20  ? Hypercholesteremia   ? Hyperparathyroidism (Megan [name])   ? Hypertension   ? ? ?Surgeries: Procedure(s): ?TOTAL HIP ARTHROPLASTY ANTERIOR APPROACH on 05/27/2021 ?  ?Consultants (if any):  ? ?Discharged Condition: Improved ? ?Hospital Course: Megan [name] is an 66 y.o. 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   ? ?She was given perioperative antibiotics:  ?Anti-infectives (From admission, onward)  ? ? Start     Dose/Rate Route Frequency Ordered Stop  ? 05/27/21 1345  ceFAZolin (ANCEF) IVPB 2g/100 mL premix       ? 2 g ?200 mL/hr over 30 Minutes Intravenous Every 6 hours 05/27/21 1155 05/27/21 2119  ? 05/27/21 0648  ceFAZolin (ANCEF) 2-4 GM/100ML-% IVPB       ?Note to Pharmacy: Trudie Reed S: cabinet override  ?    05/27/21 0648 05/27/21 0750  ? 05/27/21 0645  ceFAZolin (ANCEF) IVPB 2g/100 mL premix       ? 2 g ?200 mL/hr over 30 Minutes Intravenous On call to O.R. 05/27/21 4142 05/27/21 0747  ? ?  ?. ? ?She was given sequential compression devices, early ambulation, and aspirin 81 mg twice  daily X 30 days for DVT prophylaxis. ? ?She benefited maximally from the hospital stay and there were no complications.   ? ?Recent vital signs:  ?Vitals:  ? 05/29/21 0627 05/29/21 0836  ?BP: (!) 148/83 126/62  ?Pulse: 100 95  ?Resp:  18  ?Temp:  98.1 ?F (36.7 ?C)  ?SpO2:  98%  ? ? ?Recent laboratory studies:  ?Lab Results  ?Component Value Date  ? HGB 11.4 (L) 05/29/2021  ? HGB 12.1 05/28/2021  ? HGB 12.7 05/27/2021  ? ?Lab Results  ?Component Value Date  ? WBC 16.8 (H) 05/29/2021  ? PLT 209 05/29/2021  ? ?Lab Results  ?Component Value Date  ? INR 1.1 05/15/2021  ? ?Lab Results  ?Component Value Date  ? NA 132 (L) 05/28/2021  ? K 4.9 05/28/2021  ? CL 103 05/28/2021  ? CO2 25 05/28/2021  ? BUN 18 05/28/2021  ? CREATININE 0.94 05/28/2021  ? GLUCOSE 180 (H) 05/28/2021  ? ? ?Discharge Medications:   ?Allergies as of 05/29/2021   ? ?   Reactions  ? Peanut Butter Flavor Anaphylaxis  ? Peanut-containing Drug Products Anaphylaxis  ? ?  ? ?  ?Medication List  ?  ? ?TAKE these medications   ? ?Accu-Chek [employer] Kit ?Use as directed to test blood sugar twice daily. ?  ?acetaminophen 500 MG tablet ?Commonly known as: TYLENOL ?Take 1,000 mg by mouth 2 (two) times daily. ?  ?  albuterol 108 (90 Base) MCG/ACT inhaler ?Commonly known as: VENTOLIN HFA ?Inhale 1-2 puffs into the lungs every 6 (six) hours as needed for wheezing or shortness of breath. ?  ?aspirin 81 MG chewable tablet ?Chew 1 tablet (81 mg total) by mouth 2 (two) times daily. ?What changed: when to take this ?  ?atorvastatin 40 MG tablet ?Commonly known as: LIPITOR ?Take 1 tablet (40 mg total) by mouth daily. ?What changed: when to take this ?  ?BLACK CURRANT SEED OIL PO ?Take 1 capsule by mouth in the morning. ?  ?blood glucose meter kit and supplies Kit ?Dispense based on patient and insurance preference. Use up to four times daily as directed. ?  ?cholecalciferol 25 MCG (1000 UNIT) tablet ?Commonly known as: VITAMIN D3 ?Take 1,000 Units by mouth daily. ?   ?dapagliflozin propanediol 10 MG Tabs tablet ?Commonly known as: Farxiga ?Take 1 tablet (10 mg total) by mouth daily. ?  ?diclofenac Sodium 1 % Gel ?Commonly known as: VOLTAREN ?Apply 1 application topically 2 (two) times daily. ?  ?docusate sodium 100 MG capsule ?Commonly known as: COLACE ?Take 100 mg by mouth daily. ?  ?ferrous gluconate 240 (27 FE) MG tablet ?Commonly known as: FERGON ?Take 240 mg by mouth daily. ?  ?FISH OIL PO ?Take 1 capsule by mouth daily. ?  ?Fusion Plus Caps ?Take 1 capsule by mouth daily. ?  ?glucose blood test strip ?Use as instructed, to test blood sugar twice daily. ?  ?HAIR/SKIN/NAILS PO ?Take 1 capsule by mouth in the morning. ?  ?lisinopril 20 MG tablet ?Commonly known as: ZESTRIL ?Take 20 mg by mouth every morning. ?  ?methocarbamol 500 MG tablet ?Commonly known as: ROBAXIN ?Take 1 tablet (500 mg total) by mouth every 6 (six) hours as needed for muscle spasms. ?  ?omeprazole 40 MG capsule ?Commonly known as: PRILOSEC ?Take 1 capsule (40 mg total) by mouth 2 (two) times daily before a meal. ?What changed: when to take this ?  ?oxyCODONE 5 MG immediate release tablet ?Commonly known as: Oxy IR/ROXICODONE ?Take 1 tablet (5 mg total) by mouth every 4 (four) hours as needed for moderate pain (pain score 4-6). ?  ?sitaGLIPtin 100 MG tablet ?Commonly known as: Januvia ?Take 1 tablet (100 mg total) by mouth daily. ?What changed: when to take this ?  ? ?  ? ?  ?  ? ? ?  ?Durable Medical Equipment  ?(From admission, onward)  ?  ? ? ?  ? ?  Start     Ordered  ? 05/27/21 1616  For home use only DME Hospital bed  Once       ?Question Answer Comment  ?Length of Need 6 Months   ?Patient has (list medical condition): Hip surgery   ?The above medical condition requires: Patient requires the ability to reposition frequently   ?Bed type Semi-electric   ?Support Surface: Gel Overlay   ?  ? 05/27/21 1623  ? 05/27/21 1155  For home use only DME Hospital bed  Once       ?Question Answer Comment  ?Length  of Need 6 Months   ?The above medical condition requires: Patient requires the ability to reposition frequently   ?Head must be elevated greater than: 30 degrees   ?Bed type Semi-electric   ?Trapeze Bar Yes   ?  ? 05/27/21 1155  ? ?  ?  ? ?  ? ? ?Diagnostic Studies: DG C-Arm 1-60 Min-No Report ? ?Result Date: 05/27/2021 ?Fluoroscopy was utilized by   the requesting physician.  No radiographic interpretation.  ? ?DG C-Arm 1-60 Min-No Report ? ?Result Date: 05/27/2021 ?Fluoroscopy was utilized by the requesting physician.  No radiographic interpretation.  ? ?DG HIP UNILAT WITH PELVIS 1V RIGHT ? ?Result Date: 05/27/2021 ?CLINICAL DATA:  Operative fluoroscopy right anterior approach total hip arthroplasty. EXAM: DG HIP (WITH OR WITHOUT PELVIS) 1V RIGHT COMPARISON:  None. FINDINGS: Images were performed intraoperatively without the presence of a radiologist. Total fluoroscopic images: 3 Total fluoro time: 8 seconds The patient appears to be undergoing total right hip arthroplasty. Prior total left hip arthroplasty is noted. Please see intraoperative findings for further detail. IMPRESSION: Fluoroscopy for total right hip arthroplasty. Electronically Signed   By: Yvonne Kendall M.D.   On: 05/27/2021 09:58   ? ?Disposition: Discharge disposition: 06-Home-Health Care Svc ? ? ? ? ? ? ? ? ? ? ? ?Signed: ?Lovell Sheehan ,PA-C ?05/29/2021, 12:11 PM ? ?  ? ?

## 2021-05-28 NOTE — Progress Notes (Signed)
Physical Therapy Treatment ?Patient Details ?Name: Megan [name] ?MRN: [id number] ?DOB: Mar 31, 1955 ?Today's Date: 05/28/2021 ? ? ?History of Present Illness Pt is 66 y.o. female diagnosed w/ osteoarthritis of right hip and is s/p elective R anterior THA. PmHx: HTN, GERD, Alcohol Abuse, DM, Asthma, and L THA (01/11/21). ? ?  ?PT Comments  ? ? Pt is awake and alert resting in bed upon PT entrance into room today. Pt reports pain is currently 7/10 at rest, but is willing to work w/ PT. She performs all bed mobility w/ minA; provided due RLE pain/weakness. Once seated EOB she is able to progress to standing w/ CGA using RW to ambulate to bathroom after requesting to attempt to use it this am. After attempting, she was able to ambulate another ~36f w/ CGA using RW and returned to bed w/ no increase in negative symptoms throughout session. Pt will benefit from continued skilled PT in order to increase LE strength, improve mobility/gait, decrease c/o pain, and restore PLOF. Current discharge recommendation remains appropriate due to the level of assistance required by the patient to ensure safety and improve overall function. ?  ?Recommendations for follow up therapy are one component of a multi-disciplinary discharge planning process, led by the attending physician.  Recommendations may be updated based on patient status, additional functional criteria and insurance authorization. ? ?Follow Up Recommendations ? Home health PT ?  ?  ?Assistance Recommended at Discharge Intermittent Supervision/Assistance  ?Patient can return home with the following A little help with walking and/or transfers;A little help with bathing/dressing/bathroom;Assistance with cooking/housework;Assist for transportation;Help with stairs or ramp for entrance ?  ?Equipment Recommendations ? None recommended by PT  ?  ?Recommendations for Other Services   ? ? ?  ?Precautions / Restrictions Precautions ?Precautions: Fall;Anterior Hip ?Precaution  Booklet Issued: Yes (comment) ?Restrictions ?Weight Bearing Restrictions: Yes ?RLE Weight Bearing: Weight bearing as tolerated  ?  ? ?Mobility ? Bed Mobility ?Overal bed mobility: Needs Assistance ?Bed Mobility: Supine to Sit, Sit to Supine ?  ?  ?Supine to sit: Min assist ?Sit to supine: Min assist ?  ?General bed mobility comments: minA for RLE transfer ?  ? ?Transfers ?Overall transfer level: Needs assistance ?Equipment used: Rolling walker (2 wheels) ?Transfers: Sit to/from Stand ?Sit to Stand: Min guard ?  ?  ?  ?  ?  ?  ?  ? ?Ambulation/Gait ?Ambulation/Gait assistance: Min guard ?Gait Distance (Feet): 40 Feet ?Assistive device: Rolling walker (2 wheels) ?Gait Pattern/deviations: Step-to pattern, Decreased step length - right, Decreased step length - left, Decreased stride length ?Gait velocity: decreased ?  ?  ?  ? ? ?Stairs ?  ?  ?  ?  ?  ? ? ?Wheelchair Mobility ?  ? ?Modified Rankin (Stroke Patients Only) ?  ? ? ?  ?Balance Overall balance assessment: Needs assistance ?Sitting-balance support: Feet supported, Bilateral upper extremity supported ?Sitting balance-Leahy Scale: Good ?  ?  ?Standing balance support: Bilateral upper extremity supported, Reliant on assistive device for balance, During functional activity ?Standing balance-Leahy Scale: Fair ?  ?  ?  ?  ?  ?  ?  ?  ?  ?  ?  ?  ?  ? ?  ?Cognition Arousal/Alertness: Awake/alert ?Behavior [facility]formed ?Overall Cognitive Status: Within Functional Limits for tasks assessed ?  ?  ?  ?  ?  ?  ?  ?  ?  ?  ?  ?  ?  ?  ?  ?  ?  ?  ?  ? ?  ?  Exercises   ? ?  ?General Comments   ?  ?  ? ?Pertinent Vitals/Pain Pain Assessment ?Pain Assessment: 0-10 ?Pain Score: 7  ?Pain Descriptors / Indicators: Aching, Discomfort, Grimacing, Moaning, Throbbing ?Pain Intervention(s): Limited activity within patient's tolerance, Monitored during session, Repositioned, Patient requesting pain meds-RN notified  ? ? ?Home Living   ?  ?  ?  ?  ?  ?   ?  ?  ?  ?   ?  ?Prior Function    ?  ?  ?   ? ?PT Goals (current goals can now be found in the care plan section) Progress towards PT goals: Progressing toward goals ? ?  ?Frequency ? ? ? BID ? ? ? ?  ?PT Plan Current plan remains appropriate  ? ? ?Co-evaluation   ?  ?  ?  ?  ? ?  ?AM-PAC PT "6 Clicks" Mobility   ?Outcome Measure ? Help needed turning from your back to your side while in a flat bed without using bedrails?: A Little ?Help needed moving from lying on your back to sitting on the side of a flat bed without using bedrails?: A Little ?Help needed moving to and from a bed to a chair (including a wheelchair)?: A Little ?Help needed standing up from a chair using your arms (e.g., wheelchair or bedside chair)?: A Little ?Help needed to walk in hospital room?: A Little ?Help needed climbing 3-5 steps with a railing? : A Lot ?6 Click Score: 17 ? ?  ?End of Session Equipment Utilized During Treatment: Gait belt ?Activity Tolerance: Patient tolerated treatment well;Patient limited by pain ?Patient left: with call bell/phone within reach;in bed;with bed alarm set ?Nurse Communication: Mobility status ?PT Visit Diagnosis: Pain;Muscle weakness (generalized) (M62.81) ?Pain - Right/Left: Right ?Pain - part of body: Hip ?  ? ? ?Time: 5364-6803 ?PT Time Calculation (min) (ACUTE ONLY): 35 min ? ?Charges:             ?          ? ?Jonnie Kind, SPT ?05/28/2021, 11:41 AM ? ?

## 2021-05-28 NOTE — Progress Notes (Signed)
The patient lives at home with her daughter<her daughter will provide transportation to the doctor, she will get a hospiotal bed delivered by [employer], she has a RW and a 3 in 1 ?

## 2021-05-29 LAB — BPAM RBC
Blood Product Expiration Date: 202304112359
Blood Product Expiration Date: 202304112359
Unit Type and Rh: 5100
Unit Type and Rh: 5100

## 2021-05-29 LAB — TYPE AND SCREEN
ABO/RH(D): O POS
Antibody Screen: POSITIVE
Unit division: 0
Unit division: 0

## 2021-05-29 LAB — GLUCOSE, CAPILLARY: Glucose-Capillary: 198 mg/dL — ABNORMAL HIGH (ref 70–99)

## 2021-05-29 LAB — CBC
HCT: 35.3 % — ABNORMAL LOW (ref 36.0–46.0)
Hemoglobin: 11.4 g/dL — ABNORMAL LOW (ref 12.0–15.0)
MCH: 29.8 pg (ref 26.0–34.0)
MCHC: 32.3 g/dL (ref 30.0–36.0)
MCV: 92.2 fL (ref 80.0–100.0)
Platelets: 209 10*3/uL (ref 150–400)
RBC: 3.83 MIL/uL — ABNORMAL LOW (ref 3.87–5.11)
RDW: 16.9 % — ABNORMAL HIGH (ref 11.5–15.5)
WBC: 16.8 10*3/uL — ABNORMAL HIGH (ref 4.0–10.5)
nRBC: 0 % (ref 0.0–0.2)

## 2021-05-29 NOTE — TOC Progression Note (Signed)
Transition of Care (TOC) - Progression Note  ? ? ?Patient Details  ?Name: Megan [name] ?MRN: [id number] ?Date of Birth: 08-02-1955 ? ?Transition of Care (TOC) CM/SW Contact  ?Conception Oms, RN ?Phone Number: ?05/29/2021, 12:53 PM ? ?Clinical Narrative:   EMS arranged to transport to home I notified the patient that EMS has been called and will be coming to transport her home, there is 1 ahead of her, she stated understanding ? ? ? ?Expected Discharge Plan: [place] ?Barriers to Discharge: Continued Medical Work up ? ?Expected Discharge Plan and Services ?Expected Discharge Plan: [place] ?  ?Discharge Planning Services: CM Consult ?  ?Living arrangements for the past 2 months: [place] ?Expected Discharge Date: 05/29/21               ?DME Arranged: Hospital bed ?DME Agency: [employer] ?Date DME Agency Contacted: 05/27/21 ?Time DME Agency Contacted: [phone] ?Representative spoke with at DME Agency: Andee Poles ?HH Arranged: PT ?[place] Agency: [place] ?Date HH Agency Contacted: 05/27/21 ?Time [place]: [phone] ?Representative spoke with at [place]: Malachy Mood ? ? ?Social Determinants of Health (SDOH) Interventions ?  ? ?Readmission Risk Interventions ?No flowsheet data found. ? ?

## 2021-05-29 NOTE — TOC Progression Note (Signed)
Transition of Care (TOC) - Progression Note  ? ? ?Patient Details  ?Name: Megan [name] ?MRN: [id number] ?Date of Birth: [birth year] ? ?Transition of Care (TOC) CM/SW Contact  ?Conception Oms, RN ?Phone Number: ?05/29/2021, 12:37 PM ? ?Clinical Narrative:   I spoke with the patient to determine transportation, she stated that she will need EMS due to her daughter not being able to pick her up, I explained the hospital bed will be delivered today by [employer],  ? ? ? ?Expected Discharge Plan: [place] ?Barriers to Discharge: Continued Medical Work up ? ?Expected Discharge Plan and Services ?Expected Discharge Plan: [place] ?  ?Discharge Planning Services: CM Consult ?  ?Living arrangements for the past 2 months: [place] ?Expected Discharge Date: 05/29/21               ?DME Arranged: Hospital bed ?DME Agency: [employer] ?Date DME Agency Contacted: 05/27/21 ?Time DME Agency Contacted: [phone] ?Representative spoke with at DME Agency: Andee Poles ?HH Arranged: PT ?[place] Agency: [place] ?Date HH Agency Contacted: 05/27/21 ?Time [place]: [phone] ?Representative spoke with at [place]: Malachy Mood ? ? ?Social Determinants of Health (SDOH) Interventions ?  ? ?Readmission Risk Interventions ?No flowsheet data found. ? ?

## 2021-05-29 NOTE — Progress Notes (Signed)
Physical Therapy Treatment ?Patient Details ?Name: Megan [name] ?MRN: [id number] ?DOB: 29-Jan-1956 ?Today's Date: 05/29/2021 ? ? ?History of Present Illness Pt is 66 y.o. female diagnosed w/ osteoarthritis of right hip and is s/p elective R anterior THA. PmHx: HTN, GERD, Alcohol Abuse, DM, Asthma, and L THA (01/11/21). ? ?  ?PT Comments  ? ? Pt is awake and alert resting in bed upon PT entrance into room today. Pt does not report a quantitative pain value at rest, but still displays grimacing and moaning throughout session. She continues to perform bed mobility w/ minA w/ provided assistance for RLE transfer back into bed due to pain/weakness. She is able to progress to standing from EOB w/ CGA using RW. She ambulated ~64f to rehab gym to perform stairs and was able to perform 5 steps w/ CGA and reliance on L sided railing. Pt will benefit from continued skilled PT in order to increase LE strength, improve mobility/gait, decrease c/o pain, and restore PLOF. Current discharge recommendation remains appropriate due to the level of assistance required by the patient to ensure safety and improve overall function. ?  ?Recommendations for follow up therapy are one component of a multi-disciplinary discharge planning process, led by the attending physician.  Recommendations may be updated based on patient status, additional functional criteria and insurance authorization. ? ?Follow Up Recommendations ? Home health PT ?  ?  ?Assistance Recommended at Discharge Intermittent Supervision/Assistance  ?Patient can return home with the following A little help with walking and/or transfers;A little help with bathing/dressing/bathroom;Assistance with cooking/housework;Assist for transportation;Help with stairs or ramp for entrance ?  ?Equipment Recommendations ? None recommended by PT  ?  ?Recommendations for Other Services   ? ? ?  ?Precautions / Restrictions Precautions ?Precautions: Fall;Anterior Hip ?Precaution Booklet  Issued: Yes (comment) ?Restrictions ?Weight Bearing Restrictions: Yes ?RLE Weight Bearing: Weight bearing as tolerated  ?  ? ?Mobility ? Bed Mobility ?Overal bed mobility: Needs Assistance ?Bed Mobility: Supine to Sit, Sit to Supine ?  ?  ?Supine to sit: Min assist ?Sit to supine: Min assist ?  ?General bed mobility comments: minA for RLE transfer ?  ? ?Transfers ?Overall transfer level: Needs assistance ?Equipment used: Rolling walker (2 wheels) ?Transfers: Sit to/from Stand ?Sit to Stand: Min guard ?  ?  ?  ?  ?  ?  ?  ? ?Ambulation/Gait ?Ambulation/Gait assistance: Min guard ?Gait Distance (Feet): 40 Feet ?Assistive device: Rolling walker (2 wheels) ?Gait Pattern/deviations: Step-to pattern, Decreased step length - right, Decreased step length - left, Decreased stride length ?Gait velocity: decreased ?  ?  ?  ? ? ?Stairs ?Stairs: Yes ?Stairs assistance: Min guard ?Stair Management: One rail Left ?Number of Stairs: 5 ?  ? ? ?Wheelchair Mobility ?  ? ?Modified Rankin (Stroke Patients Only) ?  ? ? ?  ?Balance Overall balance assessment: Needs assistance ?Sitting-balance support: Feet supported, Bilateral upper extremity supported ?Sitting balance-Leahy Scale: Good ?  ?  ?Standing balance support: Bilateral upper extremity supported, Reliant on assistive device for balance, During functional activity ?Standing balance-Leahy Scale: Good ?  ?  ?  ?  ?  ?  ?  ?  ?  ?  ?  ?  ?  ? ?  ?Cognition Arousal/Alertness: Awake/alert ?Behavior During Therapy: WR[facility] LLPfor tasks assessed/performed ?Overall Cognitive Status: Within Functional Limits for tasks assessed ?  ?  ?  ?  ?  ?  ?  ?  ?  ?  ?  ?  ?  ?  ?  ?  ?  ?  ?  ? ?  ?  Exercises   ? ?  ?General Comments   ?  ?  ? ?Pertinent Vitals/Pain Pain Assessment ?Pain Assessment: Faces ?Faces Pain Scale: Hurts even more ?Pain Descriptors / Indicators: Aching, Discomfort, Grimacing, Moaning, Throbbing ?Pain Intervention(s): Limited activity within patient's tolerance, Monitored during  session, Ice applied  ? ? ?Home Living   ?  ?  ?  ?  ?  ?  ?  ?  ?  ?   ?  ?Prior Function    ?  ?  ?   ? ?PT Goals (current goals can now be found in the care plan section) Progress towards PT goals: Progressing toward goals ? ?  ?Frequency ? ? ? BID ? ? ? ?  ?PT Plan Current plan remains appropriate  ? ? ?Co-evaluation   ?  ?  ?  ?  ? ?  ?AM-PAC PT "6 Clicks" Mobility   ?Outcome Measure ? Help needed turning from your back to your side while in a flat bed without using bedrails?: A Little ?Help needed moving from lying on your back to sitting on the side of a flat bed without using bedrails?: A Little ?Help needed moving to and from a bed to a chair (including a wheelchair)?: A Little ?Help needed standing up from a chair using your arms (e.g., wheelchair or bedside chair)?: A Little ?Help needed to walk in hospital room?: A Little ?Help needed climbing 3-5 steps with a railing? : A Lot ?6 Click Score: 17 ? ?  ?End of Session Equipment Utilized During Treatment: Gait belt ?Activity Tolerance: Patient tolerated treatment well;Patient limited by pain ?Patient left: with call bell/phone within reach;in bed;with bed alarm set ?Nurse Communication: Mobility status ?PT Visit Diagnosis: Pain;Muscle weakness (generalized) (M62.81) ?Pain - Right/Left: Right ?Pain - part of body: Hip ?  ? ? ?Time: [phone] ?PT Time Calculation (min) (ACUTE ONLY): 39 min ? ?Charges:             ?          ? ? ?Jonnie Kind, SPT ?05/29/2021, 10:18 AM ? ?

## 2021-05-30 DIAGNOSIS — Z96641 Presence of right artificial hip joint: Secondary | ICD-10-CM | POA: Insufficient documentation

## 2021-07-16 ENCOUNTER — Telehealth: Payer: Self-pay

## 2021-07-16 NOTE — Progress Notes (Signed)
? ? ?Chronic Care Management ?Pharmacy Assistant  ? ?Name: Megan [name]  MRN: [id number] DOB: 05/17/1955 ? ?Reason for Encounter: CCM (General Adherence) ?  ?Recent office visits:  ?None since last CCM contact ? ?Recent consult visits:  ?None since last CCM contact ? ?Hospital visits:  ?05/27/2021 - 05/29/2021 ?[facility] ?Total Hip Arthroplasty Anterior Approach ?Start:  ?Methocarbamol 500 mg ?Oxycodone 5 mg ? ?Medications: ?Outpatient Encounter Medications as of 07/16/2021  ?Medication Sig  ? acetaminophen (TYLENOL) 500 MG tablet Take 1,000 mg by mouth 2 (two) times daily.  ? albuterol (PROVENTIL HFA;VENTOLIN HFA) 108 (90 Base) MCG/ACT inhaler Inhale 1-2 puffs into the lungs every 6 (six) hours as needed for wheezing or shortness of breath.  ? aspirin 81 MG chewable tablet Chew 1 tablet (81 mg total) by mouth 2 (two) times daily.  ? atorvastatin (LIPITOR) 40 MG tablet Take 1 tablet (40 mg total) by mouth daily. (Patient taking differently: Take 40 mg by mouth every morning.)  ? Biotin w/ Vitamins C & E (HAIR/SKIN/NAILS PO) Take 1 capsule by mouth in the morning.  ? BLACK CURRANT SEED OIL PO Take 1 capsule by mouth in the morning.  ? blood glucose meter kit and supplies KIT Dispense based on patient and insurance preference. Use up to four times daily as directed.  ? cholecalciferol (VITAMIN D3) 25 MCG (1000 UNIT) tablet Take 1,000 Units by mouth daily.  ? dapagliflozin propanediol (FARXIGA) 10 MG TABS tablet Take 1 tablet (10 mg total) by mouth daily.  ? diclofenac Sodium (VOLTAREN) 1 % GEL Apply 1 application topically 2 (two) times daily.  ? docusate sodium (COLACE) 100 MG capsule Take 100 mg by mouth daily.  ? ferrous gluconate (FERGON) 240 (27 FE) MG tablet Take 240 mg by mouth daily.  ? glucose blood test strip Use as instructed, to test blood sugar twice daily.  ? Iron-FA-B Cmp-C-Biot-Probiotic (FUSION PLUS) CAPS Take 1 capsule by mouth daily. (Patient not taking: Reported on 05/09/2021)  ? Lancets  Misc. (ACCU-CHEK FASTCLIX LANCET) KIT Use as directed to test blood sugar twice daily.  ? lisinopril (ZESTRIL) 20 MG tablet Take 20 mg by mouth every morning.  ? methocarbamol (ROBAXIN) 500 MG tablet Take 1 tablet (500 mg total) by mouth every 6 (six) hours as needed for muscle spasms.  ? Omega-3 Fatty Acids (FISH OIL PO) Take 1 capsule by mouth daily.  ? omeprazole (PRILOSEC) 40 MG capsule Take 1 capsule (40 mg total) by mouth 2 (two) times daily before a meal. (Patient taking differently: Take 40 mg by mouth every morning.)  ? oxyCODONE (OXY IR/ROXICODONE) 5 MG immediate release tablet Take 1 tablet (5 mg total) by mouth every 4 (four) hours as needed for moderate pain (pain score 4-6).  ? sitaGLIPtin (JANUVIA) 100 MG tablet Take 1 tablet (100 mg total) by mouth daily. (Patient taking differently: Take 100 mg by mouth every morning.)  ? ?No facility-administered encounter medications on file as of 07/16/2021.  ? ?Contacted Megan [name] on 07/16/2021 for general disease state and medication adherence call.  ? ?Patient is not more than 5 days past due for refill on the following medications per chart history: ? ?[facility] Medications: ?Medication Name/mg Last Fill Days Supply ?Farxiga 10 mg   07/15/2021 30 ?Januvia 100 mg  07/15/2021 30 ?Atorvastatin 40 mg  07/10/2021 30 ?Lisinopril 20 mg  07/10/2021 30 ?Patient has been taking compliant in taking her medications listed above.  ? ?What concerns do you have about your medications?  Patient does not have any concerns.  ? ?The patient denies side effects with their medications.  ? ?How often do you forget or accidentally miss a dose? Rarely ? ?Do you use a pillbox? Yes ? ?Are you having any problems getting your medications from your pharmacy? No ? ?Has the cost of your medications been a concern? No ? ?Since last visit with CPP, no interventions have been made.  ? ?The patient has not had an ED visit since last contact. Patient was in hospital for total hip replacement.   ? ?The patient denies problems with their health.  ? ?Patient denies concerns or questions for Charlene Brooke, PharmD at this time.  ? ?Patient was supposed to see Dr. Einar Pheasant for follow up around 07/09/2021. No appointment has been scheduled.  Patient stated she will call the office and make a follow up appointment.  ? ?Care Gaps: ?Annual wellness visit in last year? No ?Most Recent BP reading: 126/62 on 05/29/2021 ? ?If Diabetic: ?Most recent A1C reading: 6.7 on 04/10/2021 ?Last eye exam / retinopathy screening: Up to date ?Last diabetic foot exam: Up to date ? ?Upcoming appointments: ?Diabetic foot care on 07/22/2021 ?CCM appointment on 10/18/2021 ? ?Charlene Brooke, CPP notified ? ?[name], RMA ?Clinical Pharmacy Assistant ?[phone] ? ? ? ? ? ? ? ? ?

## 2021-07-18 ENCOUNTER — Other Ambulatory Visit: Payer: Self-pay

## 2021-07-18 MED ORDER — ACCU-CHEK FASTCLIX LANCET KIT: PACK | 12 refills | Status: DC

## 2021-07-22 ENCOUNTER — Encounter: Payer: Self-pay | Admitting: Podiatry

## 2021-07-22 ENCOUNTER — Ambulatory Visit (INDEPENDENT_AMBULATORY_CARE_PROVIDER_SITE_OTHER): Payer: Medicare Other | Admitting: Podiatry

## 2021-07-22 DIAGNOSIS — E1151 Type 2 diabetes mellitus with diabetic peripheral angiopathy without gangrene: Secondary | ICD-10-CM

## 2021-07-22 DIAGNOSIS — L84 Corns and callosities: Secondary | ICD-10-CM | POA: Diagnosis not present

## 2021-07-22 DIAGNOSIS — B351 Tinea unguium: Secondary | ICD-10-CM | POA: Diagnosis not present

## 2021-07-22 DIAGNOSIS — Q828 Other specified congenital malformations of skin: Secondary | ICD-10-CM

## 2021-07-22 DIAGNOSIS — M79609 Pain in unspecified limb: Secondary | ICD-10-CM | POA: Diagnosis not present

## 2021-07-22 NOTE — Progress Notes (Signed)
This patient returns to my office for at risk foot care.  This patient requires this care by a professional since this patient will be at risk due to having diabetes.   Patient has painful callus on both forefeet.  This patient is unable to cut nails himself since the patient cannot reach his nails.These nails are painful walking and wearing shoes.  This patient presents for at risk foot care today. ? ?General Appearance  Alert, conversant and in no acute stress. ? ?Vascular  Dorsalis pedis and posterior tibial  pulses are  weakly palpable  bilaterally.  Capillary return is within normal limits  bilaterally. Temperature is within normal limits  bilaterally. ? ?Neurologic  Senn-Weinstein monofilament wire test within normal limits  bilaterally. Muscle power within normal limits bilaterally. ? ?Nails Thick disfigured discolored nails with subungual debris  from hallux to fifth toes bilaterally. No evidence of bacterial infection or drainage bilaterally. ? ?Orthopedic  No limitations of motion  feet .  No crepitus or effusions noted.  No bony pathology or digital deformities noted. ? ?Skin  normotropic skin  noted bilaterally.  No signs of infections or ulcers noted.   Porokeratosis sub 4  B/L and sub 1 right foot. ? ?Onychomycosis  Pain in right toes  Pain in left toes  Porokeratosis  B/L. ? ?Consent was obtained for treatment procedures.   Mechanical debridement of nails 1-5  bilaterally performed with a nail nipper.  Filed with dremel without incident. Debride porokeratosis with # 15 blade followed by dremel tool. ? ? ?Return office visit    10 weeks                 Told patient to return for periodic foot care and evaluation due to potential at risk complications. ? ? ?Gardiner Barefoot DPM   ?

## 2021-08-08 ENCOUNTER — Ambulatory Visit (INDEPENDENT_AMBULATORY_CARE_PROVIDER_SITE_OTHER): Payer: Medicare Other | Admitting: Family Medicine

## 2021-08-08 ENCOUNTER — Telehealth: Payer: Self-pay | Admitting: Gastroenterology

## 2021-08-08 VITALS — BP 130/62 | HR 78 | Temp 97.8°F | Ht 66.0 in | Wt 170.4 lb

## 2021-08-08 DIAGNOSIS — K219 Gastro-esophageal reflux disease without esophagitis: Secondary | ICD-10-CM

## 2021-08-08 DIAGNOSIS — E119 Type 2 diabetes mellitus without complications: Secondary | ICD-10-CM

## 2021-08-08 DIAGNOSIS — E2839 Other primary ovarian failure: Secondary | ICD-10-CM

## 2021-08-08 DIAGNOSIS — I10 Essential (primary) hypertension: Secondary | ICD-10-CM

## 2021-08-08 DIAGNOSIS — Z1231 Encounter for screening mammogram for malignant neoplasm of breast: Secondary | ICD-10-CM

## 2021-08-08 DIAGNOSIS — K7030 Alcoholic cirrhosis of liver without ascites: Secondary | ICD-10-CM

## 2021-08-08 DIAGNOSIS — H43391 Other vitreous opacities, right eye: Secondary | ICD-10-CM

## 2021-08-08 DIAGNOSIS — E7800 Pure hypercholesterolemia, unspecified: Secondary | ICD-10-CM | POA: Diagnosis not present

## 2021-08-08 DIAGNOSIS — R0789 Other chest pain: Secondary | ICD-10-CM

## 2021-08-08 LAB — POCT GLYCOSYLATED HEMOGLOBIN (HGB A1C): Hemoglobin A1C: 6.2 % — AB (ref 4.0–5.6)

## 2021-08-08 MED ORDER — ASPIRIN 81 MG PO CHEW: 81.0000 mg | CHEWABLE_TABLET | Freq: Two times a day (BID) | ORAL | 1 refills | Status: DC

## 2021-08-08 MED ORDER — OMEPRAZOLE 40 MG PO CPDR: 40.0000 mg | DELAYED_RELEASE_CAPSULE | ORAL | 1 refills | Status: DC

## 2021-08-08 MED ORDER — LISINOPRIL 20 MG PO TABS: 20.0000 mg | ORAL_TABLET | ORAL | 1 refills | Status: DC

## 2021-08-08 NOTE — Assessment & Plan Note (Signed)
BP at goal. Cont lisinopril 20 mg

## 2021-08-08 NOTE — Assessment & Plan Note (Signed)
Lab Results  Component Value Date   HGBA1C 6.2 (A) 08/08/2021  Good control. Cont januvia 100 mg and farxiga 10 mg

## 2021-08-08 NOTE — Telephone Encounter (Signed)
Appreciate the support. Thank you.

## 2021-08-08 NOTE — Assessment & Plan Note (Signed)
Not with exertion. Does not seem associated with reflux and is resolve. Suspect 2/2 to increased stress. Advised updating if recurs and can consider cardiology consult.

## 2021-08-08 NOTE — Patient Instructions (Addendum)
I will reach out to Dr. Marius Ditch about the options for liver biopsy  Please call the location of your choice from the menu below to schedule your Mammogram and/or Bone Density appointment.    Call to schedule mammo and bone density in September  [place]  [place] [facility]   Phone:  [phone]   [place], [place] [address]                                            Services: 3D Mammogram and Bone Density  Slow transitions when turning over in bed  If it gets worse try this exercise - but google a video first  Brandt-Daroff Exercise Here's what you need to do for this exercise:  Start in an upright, seated position on your bed. Tilt your head around a 45-degree angle away from the side causing your vertigo. Move into the lying position on one side with your nose pointed up. Stay in this position for about 30 seconds or until the vertigo eases off, whichever is longer. Then move back to the seated position. Repeat on the other side.  You should do these movements from three to five times in a session. You should have three sessions a day for up to 2 weeks, or until the vertigo is gone for 2 days.

## 2021-08-08 NOTE — Assessment & Plan Note (Signed)
Discussed reducing dose of omeprazole to as needed if tolerated. She will try. Omeprazole 40 mg prn

## 2021-08-08 NOTE — Telephone Encounter (Signed)
-----   Message from Shelby Mattocks, [place] sent at 08/08/2021 10:18 AM EDT ----- Regarding: FW: Rules for Liver Biopsy We have told the patient multiply times that she has to have some one physically with her or they will not to the procedure. It is for safety purpose that is what radiology has said.  Thank you  Caryl Pina CMA  ----- Message ----- From: Lin Landsman, MD Sent: 08/08/2021  10:05 AM EDT To: Lesleigh Noe, MD, Shelby Mattocks, CMA Subject: RE: Rules for Liver Biopsy                     This a question to the radiology. Caryl Pina, could you please find out   Thanks  RV ----- Message ----- From: Lesleigh Noe, MD Sent: 08/08/2021  10:04 AM EDT To: Lin Landsman, MD Subject: Rules for Liver Biopsy                         Dr. Scot Jun said she did not get her Liver biopsy done because the person at scheduling told her she couldn't use medical transport or a taxi to get to and from the procedure.   Her daughter works from home so would be able to meet her at the house post-op.   Is this acceptable?   Lesleigh Noe   PS: It sounds like her Daughter does not have a lot of time off to be able to take her to and from procedures.

## 2021-08-08 NOTE — Telephone Encounter (Signed)
Patient and her daughter both need to understand the rules. Caryl Pina, please let her daughter know  RV

## 2021-08-08 NOTE — Assessment & Plan Note (Signed)
Following with Dr. Marius Ditch. Has not gotten the liver biopsy due to transportation issues.

## 2021-08-08 NOTE — Progress Notes (Signed)
Subjective:     Megan [name] is a 66 y.o. female presenting for Follow-up (DM )     HPI  #Diabetes Currently taking farxiga and [place]  Using medications without difficulties: No Hypoglycemic episodes:No  Hyperglycemic episodes:No  Feet problems:No  Blood Sugars averaging: does not check sugars Last HgbA1c:  Lab Results  Component Value Date   HGBA1C 6.2 (A) 08/08/2021    Diabetes Health Maintenance Due:    Diabetes Health Maintenance Due  Topic Date Due   OPHTHALMOLOGY EXAM  10/24/2021   HEMOGLOBIN A1C  02/08/2022   FOOT EXAM  04/10/2022   Hip replacement on 05/2021  #Stress - is going through some home stress with a family member - was having some dull chest pain on the right side - would go to sleep and wake up  - had a heart monitor in march and that was fine - but this has improved - was not all day - typically occurred when relaxed - never with exertion  - did sometimes happen when laying down - no sob, reflux, n/v, ha, vision changes   Is having some morning nausea but this resolves   Daughter works from home  Review of Systems   Social History   Tobacco Use  Smoking Status Every Day   Packs/day: 0.33   Years: 50.00   Pack years: 16.50   Types: Cigarettes  Smokeless Tobacco Never        Objective:    BP Readings from Last 3 Encounters:  08/08/21 130/62  05/29/21 126/62  05/15/21 (!) 141/80   Wt Readings from Last 3 Encounters:  08/08/21 170 lb 6 oz (77.3 kg)  05/27/21 166 lb 0.1 oz (75.3 kg)  05/15/21 166 lb 0.1 oz (75.3 kg)    BP 130/62   Pulse 78   Temp 97.8 F (36.6 C) (Temporal)   Ht '5\' 6"'$  (1.676 m)   Wt 170 lb 6 oz (77.3 kg)   SpO2 99%   BMI 27.50 kg/m    Physical Exam Constitutional:      General: She is not in acute distress.    Appearance: She is well-developed. She is not diaphoretic.  HENT:     Right Ear: External ear normal.     Left Ear: External ear normal.     Nose: Nose normal.  Eyes:      Conjunctiva/sclera: Conjunctivae normal.  Cardiovascular:     Rate and Rhythm: Normal rate and regular rhythm.  Pulmonary:     Effort: Pulmonary effort is normal. No respiratory distress.     Breath sounds: Normal breath sounds. No wheezing.  Abdominal:     General: Abdomen is flat. Bowel sounds are normal. There is no distension.     Palpations: Abdomen is soft.     Tenderness: There is no abdominal tenderness. There is no guarding or rebound.  Musculoskeletal:     Cervical back: Neck supple.  Skin:    General: Skin is warm and dry.     Capillary Refill: Capillary refill takes less than 2 seconds.  Neurological:     Mental Status: She is alert. Mental status is at baseline.  Psychiatric:        Mood and Affect: Mood normal.        Behavior: Behavior normal.          Assessment & Plan:   Problem List Items Addressed This Visit       Cardiovascular and Mediastinum   Essential hypertension  BP at goal. Cont lisinopril 20 mg       Relevant Medications   aspirin 81 MG chewable tablet   lisinopril (ZESTRIL) 20 MG tablet     Digestive   Gastroesophageal reflux disease without esophagitis    Discussed reducing dose of omeprazole to as needed if tolerated. She will try. Omeprazole 40 mg prn       Relevant Medications   omeprazole (PRILOSEC) 40 MG capsule   Cirrhosis of liver without ascites ([place])    Following with Dr. Marius Ditch. Has not gotten the liver biopsy due to transportation issues.          Endocrine   Type 2 diabetes mellitus without complication, without long-term current use of insulin ([place]) - Primary    Lab Results  Component Value Date   HGBA1C 6.2 (A) 08/08/2021  Good control. Cont januvia 100 mg and farxiga 10 mg       Relevant Medications   aspirin 81 MG chewable tablet   lisinopril (ZESTRIL) 20 MG tablet   Other Relevant Orders   Ambulatory referral to Ophthalmology   POCT glycosylated hemoglobin (Hb A1C) (Completed)     Other   Atypical  chest pain    Not with exertion. Does not seem associated with reflux and is resolve. Suspect 2/2 to increased stress. Advised updating if recurs and can consider cardiology consult.       Other Visit Diagnoses     Elevated LDL cholesterol level       Vitreous floaters of right eye       Relevant Orders   Ambulatory referral to Ophthalmology   Estrogen deficiency       Relevant Orders   DG Bone Density   Encounter for screening mammogram for malignant neoplasm of breast       Relevant Orders   MM Digital Screening        Return in about 6 months (around 02/08/2022) for annual.  Lesleigh Noe, MD

## 2021-09-02 LAB — HM DIABETES EYE EXAM

## 2021-10-01 ENCOUNTER — Telehealth: Payer: Self-pay | Admitting: Family Medicine

## 2021-10-01 NOTE — Telephone Encounter (Signed)
Left message for patient to call back and schedule Medicare Annual Wellness Visit (AWV) either virtually or phone   awvi 09/14/20 per [place]    This should be a 45 minute visit.  I left my direct # [phone]

## 2021-10-03 ENCOUNTER — Encounter: Payer: Self-pay | Admitting: Podiatry

## 2021-10-03 ENCOUNTER — Ambulatory Visit (INDEPENDENT_AMBULATORY_CARE_PROVIDER_SITE_OTHER): Payer: Medicare Other | Admitting: Podiatry

## 2021-10-03 ENCOUNTER — Ambulatory Visit (INDEPENDENT_AMBULATORY_CARE_PROVIDER_SITE_OTHER): Payer: Medicare Other

## 2021-10-03 VITALS — Ht 66.0 in | Wt 174.0 lb

## 2021-10-03 DIAGNOSIS — M79674 Pain in right toe(s): Secondary | ICD-10-CM | POA: Diagnosis not present

## 2021-10-03 DIAGNOSIS — E1151 Type 2 diabetes mellitus with diabetic peripheral angiopathy without gangrene: Secondary | ICD-10-CM | POA: Diagnosis not present

## 2021-10-03 DIAGNOSIS — B351 Tinea unguium: Secondary | ICD-10-CM

## 2021-10-03 DIAGNOSIS — M79675 Pain in left toe(s): Secondary | ICD-10-CM | POA: Diagnosis not present

## 2021-10-03 DIAGNOSIS — Q828 Other specified congenital malformations of skin: Secondary | ICD-10-CM | POA: Diagnosis not present

## 2021-10-03 DIAGNOSIS — Z0000 Encounter for general adult medical examination without abnormal findings: Secondary | ICD-10-CM

## 2021-10-03 NOTE — Progress Notes (Signed)
I connected with Megan [name] today by telephone and verified that I am speaking with the correct person using two identifiers. Location patient: home Location provider: work Persons participating in the virtual visit: Megan [name], [name] LPN.   I discussed the limitations, risks, security and privacy concerns of performing an evaluation and management service by telephone and the availability of in person appointments. I also discussed with the patient that there may be a patient responsible charge related to this service. The patient expressed understanding and verbally consented to this telephonic visit.    Interactive audio and video telecommunications were attempted between this provider and patient, however failed, due to patient having technical difficulties OR patient did not have access to video capability.  We continued and completed visit with audio only.     Vital signs may be patient reported or missing.  Subjective:   Megan [name] is a 67 y.o. female who presents for an Initial Medicare Annual Wellness Visit.  Review of Systems     Cardiac Risk Factors include: advanced age (>78mn, >>36women);diabetes mellitus;dyslipidemia;hypertension;smoking/ tobacco exposure     Objective:    Today's Vitals   10/03/21 1126 10/03/21 1127  Weight: 174 lb (78.9 kg)   Height: _0  (1.676 m)   PainSc:  4    Body mass index is 28.08 kg/m.     05/27/2021   11:59 AM 05/27/2021    6:54 AM 05/15/2021    9:39 AM 03/27/2021    8:29 AM 01/11/2021    4:18 PM 01/07/2021   10:46 AM 12/20/2020   10:30 AM  Advanced Directives  Does Patient Have a Medical Advance Directive? _1  No No  Would patient like information on creating a medical advance directive? No - Patient declined No - Patient declined   No - Patient declined No - Patient declined Yes (MAU/Ambulatory/Procedural Areas - Information given)    Current Medications (verified) Outpatient Encounter  Medications as of 10/03/2021  Medication Sig   acetaminophen (TYLENOL) 500 MG tablet Take 1,000 mg by mouth 2 (two) times daily.   albuterol (PROVENTIL HFA;VENTOLIN HFA) 108 (90 Base) MCG/ACT inhaler Inhale 1-2 puffs into the lungs every 6 (six) hours as needed for wheezing or shortness of breath.   aspirin 81 MG chewable tablet Chew 1 tablet (81 mg total) by mouth 2 (two) times daily.   atorvastatin (LIPITOR) 40 MG tablet Take 1 tablet (40 mg total) by mouth daily. (Patient taking differently: Take 40 mg by mouth every morning.)   Biotin w/ Vitamins C & E (HAIR/SKIN/NAILS PO) Take 1 capsule by mouth in the morning.   BLACK CURRANT SEED OIL PO Take 1 capsule by mouth in the morning.   blood glucose meter kit and supplies KIT Dispense based on patient and insurance preference. Use up to four times daily as directed.   cholecalciferol (VITAMIN D3) 25 MCG (1000 UNIT) tablet Take 1,000 Units by mouth daily.   dapagliflozin propanediol (FARXIGA) 10 MG TABS tablet Take 1 tablet (10 mg total) by mouth daily.   diclofenac Sodium (VOLTAREN) 1 % GEL Apply 1 application topically 2 (two) times daily.   docusate sodium (COLACE) 100 MG capsule Take 100 mg by mouth daily.   ferrous gluconate (FERGON) 240 (27 FE) MG tablet Take 240 mg by mouth daily.   glucose blood test strip Use as instructed, to test blood sugar twice daily.   Lancets Misc. (ACCU-CHEK FASTCLIX LANCET) KIT Use as directed to test blood sugar  twice daily.   lisinopril (ZESTRIL) 20 MG tablet Take 1 tablet (20 mg total) by mouth every morning.   Omega-3 Fatty Acids (FISH OIL PO) Take 1 capsule by mouth daily.   omeprazole (PRILOSEC) 40 MG capsule Take 1 capsule (40 mg total) by mouth every morning.   sitaGLIPtin (JANUVIA) 100 MG tablet Take 1 tablet (100 mg total) by mouth daily. (Patient taking differently: Take 100 mg by mouth every morning.)   methocarbamol (ROBAXIN) 500 MG tablet Take 1 tablet (500 mg total) by mouth every 6 (six) hours as  needed for muscle spasms. (Patient not taking: Reported on 10/03/2021)   No facility-administered encounter medications on file as of 10/03/2021.    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 but stopped 09/16/20   Hypercholesteremia    Hyperparathyroidism ([place])    Hypertension    Past Surgical History:  Procedure Laterality Date   COLONOSCOPY WITH PROPOFOL N/A 06/25/2020   Procedure: COLONOSCOPY WITH PROPOFOL;  Surgeon: Jonathon Bellows, MD;  [facility] ENDOSCOPY;  Service: Endoscopy;  Laterality: N/A;   ESOPHAGOGASTRODUODENOSCOPY (EGD) WITH PROPOFOL N/A 03/27/2021   Procedure: ESOPHAGOGASTRODUODENOSCOPY (EGD) WITH PROPOFOL;  Surgeon: Lin Landsman, MD;  Location: [facility] ENDOSCOPY;  Service: Gastroenterology;  Laterality: N/A;   TOTAL HIP ARTHROPLASTY Left 01/11/2021   Procedure: TOTAL HIP ARTHROPLASTY ANTERIOR APPROACH;  Surgeon: Lovell Sheehan, MD;  Location: [facility] ORS;  Service: Orthopedics;  Laterality: Left;   TOTAL HIP ARTHROPLASTY Right 05/27/2021   Procedure: TOTAL HIP ARTHROPLASTY ANTERIOR APPROACH;  Surgeon: Lovell Sheehan, MD;  Location: [facility] ORS;  Service: Orthopedics;  Laterality: Right;   TUBAL LIGATION     Family History  Adopted: Yes  Family history unknown: Yes   Social History   Socioeconomic History   Marital status: Married    Spouse name: Not on file   Number of children: Not on file   Years of education: Not on file   Highest education level: Not on file  Occupational History   Not on file  Tobacco Use   Smoking status: Every Day    Packs/day: 0.33    Years: 50.00    Total pack years: 16.50    Types:  Cigarettes   Smokeless tobacco: Never  Vaping Use   Vaping Use: Never used  Substance and Sexual Activity   Alcohol use: Not Currently   Drug use: Never   Sexual activity: Not Currently  Other Topics Concern   Not on file  Social History Narrative   05/17/20   From: [place] originally, moved in 2006   Living: with husband (marital stress) and adult daughter   Work: retired Patent attorney      Family: 2 children - Amie [place] (lives with her) and [place] ([place]) - 2 grandchildren - Red Christians (lives with him) and Vladimir [place]       Enjoys: playing games on phone      Exercise: not currently   Diet: low appetite, alcohol       Safety   Seat belts: Yes    Guns: No   Safe in relationships: does not  feel safe with husband - has asked him to leave   Social Determinants of Health   Financial Resource Strain: Low Risk  (10/03/2021)   Overall Financial Resource Strain (CARDIA)    Difficulty of Paying Living Expenses: Not hard at all  Food Insecurity: No Food Insecurity (10/03/2021)   Hunger Vital Sign    Worried About Running Out of Food in the Last Year: Never true    Ran Out of Food in the Last Year: Never true  Transportation Needs: No Transportation Needs (10/03/2021)   PRAPARE - Hydrologist (Medical): No    Lack of Transportation (Non-Medical): No  Physical Activity: Inactive (10/03/2021)   Exercise Vital Sign    Days of Exercise per Week: 0 days    Minutes of Exercise per Session: 0 min  Stress: No Stress Concern Present (10/03/2021)   [place]    Feeling of Stress : Not at all  Social Connections: Not on file    Tobacco Counseling Ready to quit: No Counseling given: Not Answered   Clinical Intake:  Pre-visit preparation completed: Yes  Pain : 0-10 Pain Score: 4  Pain Type: Chronic pain Pain Location: Shoulder Pain Orientation: Left, Right Pain Descriptors / Indicators:  Sharp, Stabbing Pain Onset: More than a month ago Pain Frequency: Constant     Nutritional Status: BMI 25 -29 Overweight Nutritional Risks: None Diabetes: Yes  How often do you need to have someone help you when you read instructions, pamphlets, or other written materials from your doctor or pharmacy?: 1 - Never What is the last grade level you completed in school?: 12th grade  Diabetic? Yes Nutrition Risk Assessment:  Has the patient had any N/V/D within the last 2 months?  No  Does the patient have any non-healing wounds?  No  Has the patient had any unintentional weight loss or weight gain?  No   Diabetes:  Is the patient diabetic?  Yes  If diabetic, was a CBG obtained today?  No  Did the patient bring in their glucometer from home?  No  How often do you monitor your CBG's? Does not.   Financial Strains and Diabetes Management:  Are you having any financial strains with the device, your supplies or your medication? No .  Does the patient want to be seen by Chronic Care Management for management of their diabetes?  No  Would the patient like to be referred to a Nutritionist or for Diabetic Management?  No   Diabetic Exams:  Diabetic Eye Exam: Completed 10/24/2020 Diabetic Foot Exam: Completed 04/10/2021   Interpreter Needed?: No  Information entered by :: NAllen LPN   Activities of Daily Living    10/03/2021   11:36 AM 10/02/2021   10:11 AM  In your present state of health, do you have any difficulty performing the following activities:  Hearing? 0 0  Vision? 0 0  Difficulty concentrating or making decisions? 0 0  Walking or climbing stairs? 0 0  Dressing or bathing? 0 0  Doing errands, shopping? 0 0  Preparing Food and eating ? N N  Using the Toilet? N N  In the past six months, have you accidently leaked urine? N N  Do you have problems with loss of bowel control? N N  Managing your Medications? N N  Managing your Finances? N N  Housekeeping or managing  your Housekeeping? N N    Patient Care Team: Waunita Schooner  R, MD as PCP - General (Family Medicine) Debbora Dus, [facility] as Pharmacist (Pharmacist)  Indicate any recent Medical Services you may have received from other than [facility] providers in the past year (date may be approximate).     Assessment:   This is a routine wellness examination for [place].  Hearing/Vision screen Vision Screening - Comments:: No regular eye exams  Dietary issues and exercise activities discussed: Current Exercise Habits: The patient does not participate in regular exercise at present   Goals Addressed             This Visit's Progress    Patient Stated       10/03/2021, get shoulder looked at and get dental work done       Depression Screen    10/03/2021   11:36 AM 04/10/2021    9:45 AM 02/20/2020   11:25 AM 02/15/2020    8:56 AM 02/15/2020    8:55 AM  PHQ 2/9 Scores  PHQ - 2 Score 0 0 _0 PHQ- 9 Score   16 16     Fall Risk    10/03/2021   11:35 AM 10/02/2021   10:11 AM 06/18/2020   10:06 AM 02/20/2020   11:26 AM 02/15/2020    8:48 AM  Fall Risk   Falls in the past year? 0 0 0 0 0  Number falls in past yr: 0 0 0  0  Injury with Fall? 0  0 0 0  Risk for fall due to : Medication side effect      Follow up Falls evaluation completed;Education provided;Falls prevention discussed        FALL RISK PREVENTION PERTAINING TO THE HOME:  Any stairs in or around the home? Yes  If so, are there any without handrails? No  Home free of loose throw rugs in walkways, pet beds, electrical cords, etc? Yes  Adequate lighting in your home to reduce risk of falls? Yes   ASSISTIVE DEVICES UTILIZED TO PREVENT FALLS:  Life alert? No  Use of a cane, walker or w/c? No  Grab bars in the bathroom? No  Shower chair or bench in shower? No  Elevated toilet seat or a handicapped toilet? Yes   TIMED UP AND GO:  Was the test performed? No .      Cognitive Function:        10/03/2021   11:37 AM   6CIT Screen  What Year? 0 points  What month? 0 points  What time? 0 points  Count back from 20 0 points  Months in reverse 0 points  Repeat phrase 0 points  Total Score 0 points    Immunizations Immunization History  Administered Date(s) Administered   Moderna Sars-Covid-2 Vaccination 07/11/2019, 08/11/2019    TDAP status: Due, Education has been provided regarding the importance of this vaccine. Advised may receive this vaccine at local pharmacy or Health Dept. Aware to provide a copy of the vaccination record if obtained from local pharmacy or Health Dept. Verbalized acceptance and understanding.  Flu Vaccine status: Declined, Education has been provided regarding the importance of this vaccine but patient still declined. Advised may receive this vaccine at local pharmacy or Health Dept. Aware to provide a copy of the vaccination record if obtained from local pharmacy or Health Dept. Verbalized acceptance and understanding.  Pneumococcal vaccine status: Declined,  Education has been provided regarding the importance of this vaccine but patient still declined. Advised may receive this vaccine at local pharmacy or  Health Dept. Aware to provide a copy of the vaccination record if obtained from local pharmacy or Health Dept. Verbalized acceptance and understanding.   Covid-19 vaccine status: Completed vaccines  Qualifies for Shingles Vaccine? Yes   Zostavax completed No   Shingrix Completed?: No.    Education has been provided regarding the importance of this vaccine. Patient has been advised to call insurance company to determine out of pocket expense if they have not yet received this vaccine. Advised may also receive vaccine at local pharmacy or Health Dept. Verbalized acceptance and understanding.  Screening Tests Health Maintenance  Topic Date Due   Pneumonia Vaccine 73+ Years old (1 - PCV) Never done   TETANUS/TDAP  Never done   Zoster Vaccines- Shingrix (1 of 2) Never done    DEXA SCAN  Never done   COVID-19 Vaccine (3 - Moderna series) 10/06/2019   MAMMOGRAM  02/02/2021   INFLUENZA VACCINE  10/15/2021   OPHTHALMOLOGY EXAM  10/24/2021   HEMOGLOBIN A1C  02/08/2022   FOOT EXAM  04/10/2022   COLONOSCOPY (Pts 45-99yr Insurance coverage will need to be confirmed)  06/26/2027   Hepatitis C Screening  Completed   HPV VACCINES  Aged Out    Health Maintenance  Health Maintenance Due  Topic Date Due   Pneumonia Vaccine 67 Years old (1 - PCV) Never done   TETANUS/TDAP  Never done   Zoster Vaccines- Shingrix (1 of 2) Never done   DEXA SCAN  Never done   COVID-19 Vaccine (3 - Moderna series) 10/06/2019   MAMMOGRAM  02/02/2021    Colorectal cancer screening: Type of screening: Colonoscopy. Completed 06/25/2020. Repeat every 7 years  Mammogram status: patient to schedule  Bone Density status: patient to schedule  Lung Cancer Screening: (Low Dose CT Chest recommended if Age 67-80years, 30 pack-year currently smoking OR have quit w/in 15years.) does not qualify.   Lung Cancer Screening Referral: no  Additional Screening:  Hepatitis C Screening: does qualify; Completed 02/15/2020  Vision Screening: Recommended annual ophthalmology exams for early detection of glaucoma and other disorders of the eye. Is the patient up to date with their annual eye exam?  Yes  Who is the provider or what is the name of the office in which the patient attends annual eye exams?  If pt is not established with a provider, would they like to be referred to a provider to establish care? No .   Dental Screening: Recommended annual dental exams for proper oral hygiene  Community Resource Referral / Chronic Care Management: CRR required this visit?  No   CCM required this visit?  No      Plan:     I have personally reviewed and noted the following in the patient's chart:   Medical and social history Use of alcohol, tobacco or illicit drugs  Current medications and  supplements including opioid prescriptions. Patient is not currently taking opioid prescriptions. Functional ability and status Nutritional status Physical activity Advanced directives List of other physicians Hospitalizations, surgeries, and ER visits in previous 12 months Vitals Screenings to include cognitive, depression, and falls Referrals and appointments  In addition, I have reviewed and discussed with patient certain preventive protocols, quality metrics, and best practice recommendations. A written personalized care plan for preventive services as well as general preventive health recommendations were provided to patient.     NKellie Simmering LPN   [id number]  Nurse Notes: none  Due to this being a virtual visit, the after visit  summary with patients personalized plan was offered to patient via mail or my-chart. Patient would like to access on my-chart

## 2021-10-03 NOTE — Patient Instructions (Signed)
Ms. Megan [name] , Thank you for taking time to come for your Medicare Wellness Visit. I appreciate your ongoing commitment to your health goals. Please review the following plan we discussed and let me know if I can assist you in the future.   Screening recommendations/referrals: Colonoscopy: completed 06/25/2020, due 06/26/2027 Mammogram: patient to schedule Bone Density: patient to schedule Recommended yearly ophthalmology/optometry visit for glaucoma screening and checkup Recommended yearly dental visit for hygiene and checkup  Vaccinations: Influenza vaccine: decline Pneumococcal vaccine: decline Tdap vaccine: due Shingles vaccine: discussed   Covid-19: 08/11/2019, 07/11/2019  Advanced directives: Advance directive discussed with you today.   Conditions/risks identified: smoking  Next appointment: Follow up in one year for your annual wellness visit    Preventive Care 15 Years and Older, Female Preventive care refers to lifestyle choices and visits with your health care provider that can promote health and wellness. What does preventive care include? A yearly physical exam. This is also called an annual well check. Dental exams once or twice a year. Routine eye exams. Ask your health care provider how often you should have your eyes checked. Personal lifestyle choices, including: Daily care of your teeth and gums. Regular physical activity. Eating a healthy diet. Avoiding tobacco and drug use. Limiting alcohol use. Practicing safe sex. Taking low-dose aspirin every day. Taking vitamin and mineral supplements as recommended by your health care provider. What happens during an annual well check? The services and screenings done by your health care provider during your annual well check will depend on your age, overall health, lifestyle risk factors, and family history of disease. Counseling  Your health care provider may ask you questions about your: Alcohol use. Tobacco  use. Drug use. Emotional well-being. Home and relationship well-being. Sexual activity. Eating habits. History of falls. Memory and ability to understand (cognition). Work and work Statistician. Reproductive health. Screening  You may have the following tests or measurements: Height, weight, and BMI. Blood pressure. Lipid and cholesterol levels. These may be checked every 5 years, or more frequently if you are over 1 years old. Skin check. Lung cancer screening. You may have this screening every year starting at age 57 if you have a 30-pack-year history of smoking and currently smoke or have quit within the past 15 years. Fecal occult blood test (FOBT) of the stool. You may have this test every year starting at age 24. Flexible sigmoidoscopy or colonoscopy. You may have a sigmoidoscopy every 5 years or a colonoscopy every 10 years starting at age 40. Hepatitis C blood test. Hepatitis B blood test. Sexually transmitted disease (STD) testing. Diabetes screening. This is done by checking your blood sugar (glucose) after you have not eaten for a while (fasting). You may have this done every 1-3 years. Bone density scan. This is done to screen for osteoporosis. You may have this done starting at age 53. Mammogram. This may be done every 1-2 years. Talk to your health care provider about how often you should have regular mammograms. Talk with your health care provider about your test results, treatment options, and if necessary, the need for more tests. Vaccines  Your health care provider may recommend certain vaccines, such as: Influenza vaccine. This is recommended every year. Tetanus, diphtheria, and acellular pertussis (Tdap, Td) vaccine. You may need a Td booster every 10 years. Zoster vaccine. You may need this after age 21. Pneumococcal 13-valent conjugate (PCV13) vaccine. One dose is recommended after age 102. Pneumococcal polysaccharide (PPSV23) vaccine. One dose is recommended  after age 80. Talk to your health care provider about which screenings and vaccines you need and how often you need them. This information is not intended to replace advice given to you by your health care provider. Make sure you discuss any questions you have with your health care provider. Document Released: 03/30/2015 Document Revised: 11/21/2015 Document Reviewed: 01/02/2015 Elsevier Interactive Patient Education  2017 [employer] Prevention in the Home Falls can cause injuries. They can happen to people of all ages. There are many things you can do to make your home safe and to help prevent falls. What can I do on the outside of my home? Regularly fix the edges of walkways and driveways and fix any cracks. Remove anything that might make you trip as you walk through a door, such as a raised step or threshold. Trim any bushes or trees on the path to your home. Use bright outdoor lighting. Clear any walking paths of anything that might make someone trip, such as rocks or tools. Regularly check to see if handrails are loose or broken. Make sure that both sides of any steps have handrails. Any raised decks and porches should have guardrails on the edges. Have any leaves, snow, or ice cleared regularly. Use sand or salt on walking paths during winter. Clean up any spills in your garage right away. This includes oil or grease spills. What can I do in the bathroom? Use night lights. Install grab bars by the toilet and in the tub and shower. Do not use towel bars as grab bars. Use non-skid mats or decals in the tub or shower. If you need to sit down in the shower, use a plastic, non-slip stool. Keep the floor dry. Clean up any water that spills on the floor as soon as it happens. Remove soap buildup in the tub or shower regularly. Attach bath mats securely with double-sided non-slip rug tape. Do not have throw rugs and other things on the floor that can make you trip. What can I do  in the bedroom? Use night lights. Make sure that you have a light by your bed that is easy to reach. Do not use any sheets or blankets that are too big for your bed. They should not hang down onto the floor. Have a firm chair that has side arms. You can use this for support while you get dressed. Do not have throw rugs and other things on the floor that can make you trip. What can I do in the kitchen? Clean up any spills right away. Avoid walking on wet floors. Keep items that you use a lot in easy-to-reach places. If you need to reach something above you, use a strong step stool that has a grab bar. Keep electrical cords out of the way. Do not use floor polish or wax that makes floors slippery. If you must use wax, use non-skid floor wax. Do not have throw rugs and other things on the floor that can make you trip. What can I do with my stairs? Do not leave any items on the stairs. Make sure that there are handrails on both sides of the stairs and use them. Fix handrails that are broken or loose. Make sure that handrails are as long as the stairways. Check any carpeting to make sure that it is firmly attached to the stairs. Fix any carpet that is loose or worn. Avoid having throw rugs at the top or bottom of the stairs. If you do  have throw rugs, attach them to the floor with carpet tape. Make sure that you have a light switch at the top of the stairs and the bottom of the stairs. If you do not have them, ask someone to add them for you. What else can I do to help prevent falls? Wear shoes that: Do not have high heels. Have rubber bottoms. Are comfortable and fit you well. Are closed at the toe. Do not wear sandals. If you use a stepladder: Make sure that it is fully opened. Do not climb a closed stepladder. Make sure that both sides of the stepladder are locked into place. Ask someone to hold it for you, if possible. Clearly mark and make sure that you can see: Any grab bars or  handrails. First and last steps. Where the edge of each step is. Use tools that help you move around (mobility aids) if they are needed. These include: Canes. Walkers. Scooters. Crutches. Turn on the lights when you go into a dark area. Replace any light bulbs as soon as they burn out. Set up your furniture so you have a clear path. Avoid moving your furniture around. If any of your floors are uneven, fix them. If there are any pets around you, be aware of where they are. Review your medicines with your doctor. Some medicines can make you feel dizzy. This can increase your chance of falling. Ask your doctor what other things that you can do to help prevent falls. This information is not intended to replace advice given to you by your health care provider. Make sure you discuss any questions you have with your health care provider. Document Released: 12/28/2008 Document Revised: 08/09/2015 Document Reviewed: 04/07/2014 Elsevier Interactive Patient Education  2017 [employer].

## 2021-10-03 NOTE — Progress Notes (Signed)
This patient returns to my office for at risk foot care.  This patient requires this care by a professional since this patient will be at risk due to having diabetes.   Patient has painful callus on both forefeet.  This patient is unable to cut nails himself since the patient cannot reach his nails.These nails are painful walking and wearing shoes.  This patient presents for at risk foot care today.  General Appearance  Alert, conversant and in no acute stress.  Vascular  Dorsalis pedis and posterior tibial  pulses are  weakly palpable  bilaterally.  Capillary return is within normal limits  bilaterally. Temperature is within normal limits  bilaterally.  Neurologic  Senn-Weinstein monofilament wire test within normal limits  bilaterally. Muscle power within normal limits bilaterally.  Nails Thick disfigured discolored nails with subungual debris  from hallux to fifth toes bilaterally. No evidence of bacterial infection or drainage bilaterally.  Orthopedic  No limitations of motion  feet .  No crepitus or effusions noted.  No bony pathology or digital deformities noted.  Skin  normotropic skin  noted bilaterally.  No signs of infections or ulcers noted.   Porokeratosis sub 4 left and sub 1, 4 right foot.  Onychomycosis  Pain in right toes  Pain in left toes  Porokeratosis  B/L.  Consent was obtained for treatment procedures.   Mechanical debridement of nails 1-5  bilaterally performed with a nail nipper.  Filed with dremel without incident. Debride porokeratosis with # 15 blade followed by dremel tool.   Return office visit    10 weeks                 Told patient to return for periodic foot care and evaluation due to potential at risk complications.   Priyansh Pry DPM   

## 2021-10-04 ENCOUNTER — Encounter: Payer: Self-pay | Admitting: Family Medicine

## 2021-10-15 ENCOUNTER — Telehealth: Payer: Self-pay

## 2021-10-15 NOTE — Progress Notes (Signed)
    Chronic Care Management Pharmacy Assistant   Name: Megan [name]  MRN: [id number] DOB: 14-Nov-1955   Reason for Encounter: CCM (Appointment Reminder)  Medications: Outpatient Encounter Medications as of 10/15/2021  Medication Sig   acetaminophen (TYLENOL) 500 MG tablet Take 1,000 mg by mouth 2 (two) times daily.   albuterol (PROVENTIL HFA;VENTOLIN HFA) 108 (90 Base) MCG/ACT inhaler Inhale 1-2 puffs into the lungs every 6 (six) hours as needed for wheezing or shortness of breath.   aspirin 81 MG chewable tablet Chew 1 tablet (81 mg total) by mouth 2 (two) times daily.   atorvastatin (LIPITOR) 40 MG tablet Take 1 tablet (40 mg total) by mouth daily. (Patient taking differently: Take 40 mg by mouth every morning.)   Biotin w/ Vitamins C & E (HAIR/SKIN/NAILS PO) Take 1 capsule by mouth in the morning.   BLACK CURRANT SEED OIL PO Take 1 capsule by mouth in the morning.   blood glucose meter kit and supplies KIT Dispense based on patient and insurance preference. Use up to four times daily as directed.   cholecalciferol (VITAMIN D3) 25 MCG (1000 UNIT) tablet Take 1,000 Units by mouth daily.   diclofenac Sodium (VOLTAREN) 1 % GEL Apply 1 application topically 2 (two) times daily.   docusate sodium (COLACE) 100 MG capsule Take 100 mg by mouth daily.   ferrous gluconate (FERGON) 240 (27 FE) MG tablet Take 240 mg by mouth daily.   glucose blood test strip Use as instructed, to test blood sugar twice daily.   Lancets Misc. (ACCU-CHEK FASTCLIX LANCET) KIT Use as directed to test blood sugar twice daily.   lisinopril (ZESTRIL) 20 MG tablet Take 1 tablet (20 mg total) by mouth every morning.   methocarbamol (ROBAXIN) 500 MG tablet Take 1 tablet (500 mg total) by mouth every 6 (six) hours as needed for muscle spasms. (Patient not taking: Reported on 10/03/2021)   Omega-3 Fatty Acids (FISH OIL PO) Take 1 capsule by mouth daily.   omeprazole (PRILOSEC) 40 MG capsule Take 1 capsule (40 mg total) by  mouth every morning.   sitaGLIPtin (JANUVIA) 100 MG tablet Take 1 tablet (100 mg total) by mouth daily. (Patient taking differently: Take 100 mg by mouth every morning.)   No facility-administered encounter medications on file as of 10/15/2021.   [name] was contacted to remind of upcoming telephone visit with Charlene Brooke on 10/18/21 at 1:00. Patient was reminded to have any blood glucose and blood pressure readings available for review at appointment.   Patient confirmed appointment.  Are you having any problems with your medications? No   Do you have any concerns you like to discuss with the pharmacist? No  CCM referral has been placed prior to visit?  Yes   Star Rating Drugs: Medication:  Last Fill: Day Supply Atorvastatin 40 mg 09/25/21 30 Lisinopril 20 mg 09/25/21 30 Januvia 100 mg 08/07/21 30  -verified with [place] 10 mg  09/25/21 30  Patient states she takes her medication daily and does not miss. She does not know why she has Januvia left.   Charlene Brooke, CPP notified  [name], [place] Clinical Pharmacy Assistant [phone]

## 2021-10-18 ENCOUNTER — Ambulatory Visit (INDEPENDENT_AMBULATORY_CARE_PROVIDER_SITE_OTHER): Payer: Medicare Other | Admitting: Pharmacist

## 2021-10-18 DIAGNOSIS — I10 Essential (primary) hypertension: Secondary | ICD-10-CM

## 2021-10-18 DIAGNOSIS — E119 Type 2 diabetes mellitus without complications: Secondary | ICD-10-CM

## 2021-10-18 DIAGNOSIS — E782 Mixed hyperlipidemia: Secondary | ICD-10-CM

## 2021-10-18 MED ORDER — SITAGLIPTIN PHOSPHATE 100 MG PO TABS: 100.0000 mg | ORAL_TABLET | Freq: Every day | ORAL | 0 refills | Status: DC

## 2021-10-18 MED ORDER — DAPAGLIFLOZIN PROPANEDIOL 10 MG PO TABS: 10.0000 mg | ORAL_TABLET | Freq: Every day | ORAL | 0 refills | Status: DC

## 2021-10-18 NOTE — Patient Instructions (Signed)
Visit Information  Phone number for Pharmacist: [phone]   Goals Addressed   None     Care Plan : [place]  Updates made by Charlton Haws, [facility] since 10/18/2021 12:00 AM     Problem: Hypertension, Hyperlipidemia, Diabetes, and Tobacco use, Iron deficiency      Long-Range Goal: Disease mgmt   Start Date: 04/23/2021  Expected End Date: 10/19/2022  This Visit's Progress: On track  Recent Progress: On track  Priority: High  Note:   Current Barriers:  None identified  Pharmacist Clinical Goal(s):  Patient will contact provider office for questions/concerns as evidenced notation of same in electronic health record through collaboration with PharmD and provider.   Interventions: 1:1 collaboration with Lesleigh Noe, MD regarding development and update of comprehensive plan of care as evidenced by provider attestation and co-signature Inter-disciplinary care team collaboration (see longitudinal plan of care) Comprehensive medication review performed; medication list updated in electronic medical record  Hypertension (BP goal <140/90) -Controlled - per clinic readings; pt is not checking BP at home and is not interested in getting a monitor -Current treatment: Lisinopril 20 mg daily- Appropriate, Effective, Safe, Accessible -Reviewed adherence, no gaps in fill history -Recommended to continue current medication  Diabetes (A1c goal <7%) -Controlled - A1c improved to 6.2% (07/2021), improved from > 9%; pt affirms compliance with Wilder Glade which is not currently on med list due to Rx expiring, there is no reason to stop Wilder Glade so will refill -Current home glucose readings: n/a -Current medications: Januvia 100 mg daily - Appropriate, Effective, Safe, Accessible Farxiga 10 mg daily - Appropriate, Effective, Safe, Accessible Testing supplies -Medications previously tried: glimepiride, metformin -Recommended to continue current medication; refilled Farxiga via  office refill protocol  Hyperlipidemia: (LDL goal < 100) -Controlled - LDL 84 (09/2020) at goal -Dx fatty liver disease, alcohol abuse -Current treatment: Atorvastatin 40 mg daily -Appropriate, Effective, Safe, Accessible OTC Fish oil -Educated on Cholesterol goals; Benefits of statin for ASCVD risk reduction; -Recommended to continue current medication  Iron deficiency anemia (Goal: improve iron levels) -Unknown control - pt just started iron supplement and endorses compliance; she has not had repeat CBC/iron studies yet; she reports she used to chew on ice all the time but that craving has gone away, she was informed that ice cravings are a sign of iron defiency -Current treatment  Iron supplement (Fusion Plus) - Appropriate, Query Effective, Safe, Accessible -Recommended to continue current medication; repeat iron studies  GERD (Goal: minimize symptoms of reflux ) -Controlled - PPI recently reduced per PCP, pt reports no issues with this -Current treatment  Omeprazole 40 mg daily - Appropriate, Effective, Safe, Accessible -Medications previously tried: none reported  -Counseled on small meals, elevating head, and sleeping on left side -Recommended to continue current medication  Health Maintenance -Vaccine gaps: Flu, Prevnar, Shingrix -Fatty liver, established with Dr Vicente Males 02/2021 -Every day smoker; not ready to quit  Patient Goals/Self-Care Activities Patient will:  - take medications as prescribed as evidenced by patient report and record review focus on medication adherence by routine check glucose 2-3x per week, document, and provide at future appointments      Patient verbalizes understanding of instructions and care plan provided today and agrees to view in [place]. Active MyChart status and patient understanding of how to access instructions and care plan via MyChart confirmed with patient.    Telephone follow up appointment with pharmacy team member scheduled for: 1  year  Charlene Brooke, PharmD, BCACP Clinical  Pharmacist [place] [facility] [phone]

## 2021-10-18 NOTE — Progress Notes (Signed)
Chronic Care Management Pharmacy Note  10/18/2021 Name:  Megan [name] MRN:  [id number] DOB:  1955-04-09  Summary: CCM F/U visit -Reviewed medications; pt affirms compliance -Reviewed Wilder Glade - it is not on med list (Rx expired July 2023) but pt is still taking it; per chart review it should be continued  Recommendations/Changes made from today's visit: -Refilled Farxiga 10 mg via office refill protocol  Plan: -[facility] will call patient 3 months for adherence review -Pharmacist follow up televisit scheduled for 1 year -PCP 02/11/22 (Dr Einar Pheasant - needs rescheduling)    Subjective: Megan [name] is an 66 y.o. year old female who is a primary patient of Cody, Jobe Marker, MD.  The CCM team was consulted for assistance with disease management and care coordination needs.    Engaged with patient by telephone for follow up visit in response to provider referral for pharmacy case management and/or care coordination services.   Consent to Services:  The patient was given information about Chronic Care Management services, agreed to services, and gave verbal consent prior to initiation of services.  Please see initial visit note for detailed documentation.   Patient Care Team: Lesleigh Noe, MD as PCP - General (Family Medicine) Debbora Dus, [facility] as Pharmacist (Pharmacist)  Recent office visits:  08/08/21 Dr Einar Pheasant OV: f/u - a1c 6.2%. Reduce omeprazole to daily. Liver biopsy delayed d/t transportation issues. RTC 6 months.  04/10/21 NP Ladell Heads OV: f/u T2DM. A1c 6.7. Refilled Januvia and [place]. Referred for DM eye and foot exams. 12/05/2020 - Waunita Schooner, MD - Patient presented for pre-op clearance and diabetes. Labs: CBC, CMP, Protime-INR, EKG. Start: (BACTRIM DS) 800-160 MG tablet 10/24/2020 - Waunita Schooner, MD - Patient presented for diabetes and vaginal itching. Labs: A1c. Start: Januvia 100 mg daily  Recent consult visits:  03/27/21 Admission for  EGD.  03/05/21 Dr Marius Ditch (GI): establish care for fatty liver disease. Ordered CT. Found severe IDA, start iron replacement (Fusion Plus)  12/19/2020 - Orthopedics - Patient presented for left hip pain. Plan is for left total hip replacement. Procedure is scheduled on: Monday. January 07, 2021.   06/25/2020 - Jonathon Bellows, MD - Patient presented for Colonoscopy with Propofol.    Hospital visits: 05/27/21 - 05/29/21 Admission for Right THA 01/11/21-01/15/21 admission for Left THA.  Objective:  Lab Results  Component Value Date   CREATININE 0.94 05/28/2021   BUN 18 05/28/2021   GFR 67.66 12/05/2020   GFRNONAA >60 05/28/2021   NA 132 (L) 05/28/2021   K 4.9 05/28/2021   CALCIUM 11.3 (H) 05/28/2021   CO2 25 05/28/2021   GLUCOSE 180 (H) 05/28/2021    Lab Results  Component Value Date/Time   HGBA1C 6.2 (A) 08/08/2021 09:40 AM   HGBA1C 6.7 (A) 04/10/2021 10:07 AM   HGBA1C 5.8 12/12/2019 12:00 AM   GFR 67.66 12/05/2020 09:11 AM   GFR 69.63 09/20/2020 10:45 AM   MICROALBUR <0.7 04/10/2021 10:14 AM   MICROALBUR Negative 12/12/2019 12:00 AM    Last diabetic Eye exam: Referral place 12/19/20 by PCP  Last diabetic Foot exam: 10/24/20 - completed by PCP    Lab Results  Component Value Date   CHOL 180 09/20/2020   HDL 68.20 09/20/2020   LDLCALC 84 09/20/2020   TRIG 138.0 09/20/2020   CHOLHDL 3 09/20/2020       Latest Ref Rng & Units 05/15/2021   10:22 AM 12/05/2020    9:11 AM 09/20/2020   10:45 AM  Hepatic  Function  Total Protein 6.5 - 8.1 g/dL 8.6  8.2  8.2   Albumin 3.5 - 5.0 g/dL 3.7  3.7  4.1   AST 15 - 41 U/L 47  51  73   ALT 0 - 44 U/L 26  29  40   Alk Phosphatase 38 - 126 U/L 342  432  330   Total Bilirubin 0.3 - 1.2 mg/dL 0.8  1.0  1.0     No results found for: "TSH", "FREET4"     Latest Ref Rng & Units 05/29/2021    3:23 AM 05/28/2021    3:53 AM 05/27/2021    7:04 AM  CBC  WBC 4.0 - 10.5 K/uL 16.8  14.6  10.7   Hemoglobin 12.0 - 15.0 g/dL 11.4  12.1  12.7    Hematocrit 36.0 - 46.0 % 35.3  38.8  40.3   Platelets 150 - 400 K/uL 209  225  224     Lab Results  Component Value Date/Time   VD25OH 32.1 03/05/2021 09:25 AM    Clinical ASCVD: No  The ASCVD Risk score (Arnett DK, et al., 2019) failed to calculate for the following reasons:   The systolic blood pressure is missing       10/03/2021   11:36 AM 04/10/2021    9:45 AM 02/20/2020   11:25 AM  Depression screen PHQ 2/9  Decreased Interest 0 0 3  Down, Depressed, Hopeless 0 0 3  PHQ - 2 Score 0 0 6  Altered sleeping   3  Tired, decreased energy   2  Change in appetite   2  Feeling bad or failure about yourself    3  Trouble concentrating   0  Moving slowly or fidgety/restless   0  Suicidal thoughts   0  PHQ-9 Score   16     Social History   Tobacco Use  Smoking Status Every Day   Packs/day: 0.33   Years: 50.00   Total pack years: 16.50   Types: Cigarettes  Smokeless Tobacco Never   BP Readings from Last 3 Encounters:  08/08/21 130/62  05/29/21 126/62  05/15/21 (!) 141/80   Pulse Readings from Last 3 Encounters:  08/08/21 78  05/29/21 95  05/15/21 76   Wt Readings from Last 3 Encounters:  10/03/21 174 lb (78.9 kg)  08/08/21 170 lb 6 oz (77.3 kg)  05/27/21 166 lb 0.1 oz (75.3 kg)   BMI Readings from Last 3 Encounters:  10/03/21 28.08 kg/m  08/08/21 27.50 kg/m  05/27/21 26.79 kg/m    Assessment/Interventions: Review of patient past medical history, allergies, medications, health status, including review of consultants reports, laboratory and other test data, was performed as part of comprehensive evaluation and provision of chronic care management services.   SDOH:  (Social Determinants of Health) assessments and interventions performed: No - done 09/2021    SDOH Screenings   Alcohol Screen: Not on file  Depression (PHQ2-9): Low Risk  (10/03/2021)   Depression (PHQ2-9)    PHQ-2 Score: 0  Financial Resource Strain: Low Risk  (10/03/2021)   Overall  Financial Resource Strain (CARDIA)    Difficulty of Paying Living Expenses: Not hard at all  Food Insecurity: No Food Insecurity (10/03/2021)   Hunger Vital Sign    Worried About Running Out of Food in the Last Year: Never true    Ran Out of Food in the Last Year: Never true  Housing: Not on file  Physical Activity: Inactive (10/03/2021)  Exercise Vital Sign    Days of Exercise per Week: 0 days    Minutes of Exercise per Session: 0 min  Social Connections: Not on file  Stress: No Stress Concern Present (10/03/2021)   [place]    Feeling of Stress : Not at all  Tobacco Use: High Risk (10/03/2021)   Patient History    Smoking Tobacco Use: Every Day    Smokeless Tobacco Use: Never    Passive Exposure: Not on file  Transportation Needs: No Transportation Needs (10/03/2021)   PRAPARE - Transportation    Lack of Transportation (Medical): No    Lack of Transportation (Non-Medical): No    CCM Care Plan  Allergies  Allergen Reactions   Peanut Butter Flavor Anaphylaxis   Peanut-Containing Drug Products Anaphylaxis    Medications Reviewed Today     Reviewed by Charlton Haws, [facility] (Pharmacist) on 10/18/21 at 1311  Med List Status: <None>   Medication Order Taking? Sig Documenting Provider Last Dose Status Informant  acetaminophen (TYLENOL) 500 MG tablet [id number] Yes Take 1,000 mg by mouth 2 (two) times daily. [provider] Taking Active Self  albuterol (PROVENTIL HFA;VENTOLIN HFA) 108 (90 Base) MCG/ACT inhaler [id number] Yes Inhale 1-2 puffs into the lungs every 6 (six) hours as needed for wheezing or shortness of breath. [provider] Taking Active Self  aspirin 81 MG chewable tablet [id number] Yes Chew 1 tablet (81 mg total) by mouth 2 (two) times daily. Lesleigh Noe, MD Taking Active   atorvastatin (LIPITOR) 40 MG tablet [id number] Yes Take 1 tablet (40 mg total) by mouth daily.  Patient  taking differently: Take 40 mg by mouth every morning.   Elby Beck, FNP Taking Active Self  Biotin w/ Vitamins C & E (HAIR/SKIN/NAILS PO) [id number] Yes Take 1 capsule by mouth in the morning. [provider] Taking Active Self  BLACK CURRANT SEED OIL PO [id number] Yes Take 1 capsule by mouth in the morning. [provider] Taking Active Self  blood glucose meter kit and supplies KIT [id number] Yes Dispense based on patient and insurance preference. Use up to four times daily as directed. Lesleigh Noe, MD Taking Active Self  cholecalciferol (VITAMIN D3) 25 MCG (1000 UNIT) tablet [id number] Yes Take 1,000 Units by mouth daily. [provider] Taking Active Self  dapagliflozin propanediol (FARXIGA) 10 MG TABS tablet [id number] Yes Take 1 tablet (10 mg total) by mouth daily before breakfast. Lesleigh Noe, MD Taking Active   diclofenac Sodium (VOLTAREN) 1 % GEL [id number] Yes Apply 1 application topically 2 (two) times daily. [provider] Taking Active Self  docusate sodium (COLACE) 100 MG capsule [id number] Yes Take 100 mg by mouth daily. [provider] Taking Active Self  ferrous gluconate (FERGON) 240 (27 FE) MG tablet [id number] Yes Take 240 mg by mouth daily. [provider] Taking Active Self  glucose blood test strip [id number] Yes Use as instructed, to test blood sugar twice daily. Lesleigh Noe, MD Taking Active Self  Lancets Misc. (ACCU-CHEK FASTCLIX LANCET) KIT [id number] Yes Use as directed to test blood sugar twice daily. Lesleigh Noe, MD Taking Active   lisinopril (ZESTRIL) 20 MG tablet [id number] Yes Take 1 tablet (20 mg total) by mouth every morning. Lesleigh Noe, MD Taking Active   methocarbamol (ROBAXIN) 500 MG tablet [id number] Yes Take 1 tablet (500 mg total) by mouth every 6 (six) hours as needed for muscle spasms. Carlynn Spry,  PA-C Taking Active   Omega-3 Fatty Acids (FISH OIL PO) [id number] Yes Take 1 capsule by  mouth daily. [provider] Taking Active Self  omeprazole (PRILOSEC) 40 MG capsule [id number] Yes Take 1 capsule (40 mg total) by mouth every morning. Cody, Jessica R, MD Taking Active   sitaGLIPtin (JANUVIA) 100 MG tablet [id number] Yes Take 1 tablet (100 mg total) by mouth daily.  Patient taking differently: Take 100 mg by mouth every morning.   Dugal, Tabitha, FNP Taking Active Self            Patient Active Problem List   Diagnosis Date Noted   Atypical chest pain 08/08/2021   Porokeratosis 07/22/2021   History of total hip replacement, right 05/27/2021   Corns and callus 04/10/2021   Cirrhosis of liver without ascites (HCC)    Iron deficiency anemia    History of total hip replacement, left 01/11/2021   Left breast abscess 12/05/2020   Tobacco use 12/05/2020   Fatty liver disease, nonalcoholic 12/05/2020   Arthritis of left hip 09/20/2020   Assistance needed with transportation 09/20/2020   Hepatomegaly 05/17/2020   Hyperparathyroidism (HCC) 01/19/2018   Mixed hyperlipidemia 01/19/2018   Class 1 obesity with body mass index (BMI) of 33.0 to 33.9 in adult 05/27/2017   Essential hypertension 05/27/2017   Gastroesophageal reflux disease without esophagitis 05/27/2017   Mucopurulent chronic bronchitis (HCC) 05/27/2017   Type 2 diabetes mellitus without complication, without long-term current use of insulin (HCC) 05/27/2017    Immunization History  Administered Date(s) Administered   Moderna Sars-Covid-2 Vaccination 07/11/2019, 08/11/2019     Conditions to be addressed/monitored:  Hypertension, Hyperlipidemia, Diabetes, and Tobacco use, Iron deficiency  Care Plan : CCM Pharmacy Care Plan  Updates made by Foltanski, Lindsey N, RPH since 10/18/2021 12:00 AM     Problem: Hypertension, Hyperlipidemia, Diabetes, and Tobacco use, Iron deficiency      Long-Range Goal: Disease mgmt   Start Date: 04/23/2021  Expected End Date: 10/19/2022  This Visit's Progress: On  track  Recent Progress: On track  Priority: High  Note:   Current Barriers:  None identified  Pharmacist Clinical Goal(s):  Patient will contact provider office for questions/concerns as evidenced notation of same in electronic health record through collaboration with PharmD and provider.   Interventions: 1:1 collaboration with Cody, Jessica R, MD regarding development and update of comprehensive plan of care as evidenced by provider attestation and co-signature Inter-disciplinary care team collaboration (see longitudinal plan of care) Comprehensive medication review performed; medication list updated in electronic medical record  Hypertension (BP goal <140/90) -Controlled - per clinic readings; pt is not checking BP at home and is not interested in getting a monitor -Current treatment: Lisinopril 20 mg daily- Appropriate, Effective, Safe, Accessible -Reviewed adherence, no gaps in fill history -Recommended to continue current medication  Diabetes (A1c goal <7%) -Controlled - A1c improved to 6.2% (07/2021), improved from > 9%; pt affirms compliance with Farxiga which is not currently on med list due to Rx expiring, there is no reason to stop Farxiga so will refill -Current home glucose readings: n/a -Current medications: Januvia 100 mg daily - Appropriate, Effective, Safe, Accessible Farxiga 10 mg daily - Appropriate, Effective, Safe, Accessible Testing supplies -Medications previously tried: glimepiride, metformin -Recommended to continue current medication; refilled Farxiga via office refill protocol  Hyperlipidemia: (LDL goal < 100) -Controlled - LDL 84 (09/2020) at goal -Dx fatty liver disease, alcohol abuse -Current treatment: Atorvastatin 40 mg daily -Appropriate, Effective, Safe, Accessible OTC Fish   oil -Educated on Cholesterol goals; Benefits of statin for ASCVD risk reduction; -Recommended to continue current medication  Iron deficiency anemia (Goal: improve iron  levels) -Unknown control - pt just started iron supplement and endorses compliance; she has not had repeat CBC/iron studies yet; she reports she used to chew on ice all the time but that craving has gone away, she was informed that ice cravings are a sign of iron defiency -Current treatment  Iron supplement (Fusion Plus) - Appropriate, Query Effective, Safe, Accessible -Recommended to continue current medication; repeat iron studies  GERD (Goal: minimize symptoms of reflux ) -Controlled - PPI recently reduced per PCP, pt reports no issues with this -Current treatment  Omeprazole 40 mg daily - Appropriate, Effective, Safe, Accessible -Medications previously tried: none reported  -Counseled on small meals, elevating head, and sleeping on left side -Recommended to continue current medication  Health Maintenance -Vaccine gaps: Flu, Prevnar, Shingrix -Fatty liver, established with Dr Anna 02/2021 -Every day smoker; not ready to quit  Patient Goals/Self-Care Activities Patient will:  - take medications as prescribed as evidenced by patient report and record review focus on medication adherence by routine check glucose 2-3x per week, document, and provide at future appointments      Medication Assistance: None required.  Patient affirms current coverage meets needs. (Medicare/Medicaid)  Compliance/Adherence/Medication fill history: Care Gaps: DEXA Scan - (ordered 07/2021) Mammogram - due 02/02/21 (ordered 07/2021)  Star Rating Drugs:  Atorvastatin - PDC 88%; LF 09/25/21 x 30 ds Lisinopril - PDC 85%; LF 09/25/21 x 30 ds Januvia - PDC 100% Farxiga - LF 09/25/21 x 30 ds  Medication Access: Within the past 30 days, how often has patient missed a dose of medication? 0 Is a pillbox or other method used to improve adherence? No  Factors that may affect medication adherence? no barriers identified Are meds synced by current pharmacy? Yes  Are meds delivered by current pharmacy? Yes  Does  patient experience delays in picking up medications due to transportation concerns? No   [facility] Services Reviewed: Is patient disadvantaged to use [facility]?: No  Current Rx insurance plan: UHC Dual complete Name and location of Current pharmacy:  [facility] - [place], [place] - [address] [place]  [address] Phone: [phone] Fax: [phone]  [facility] services reviewed with patient today?: No  Patient requests to transfer care to [facility]?: No  Reason patient declined to change pharmacies: Loyalty to other pharmacy/Patient preference   Care Plan and Follow Up Patient Decision:  Patient agrees to Care Plan and Follow-up.  Follow Up Plan: Telephone follow up appointment with care management team member scheduled for: 1 year  Lindsey Foltanski, PharmD, BCACP Clinical Pharmacist [facility] [place] [phone] 

## 2021-11-14 DIAGNOSIS — E1159 Type 2 diabetes mellitus with other circulatory complications: Secondary | ICD-10-CM

## 2021-11-14 DIAGNOSIS — E785 Hyperlipidemia, unspecified: Secondary | ICD-10-CM | POA: Diagnosis not present

## 2021-11-14 DIAGNOSIS — F17210 Nicotine dependence, cigarettes, uncomplicated: Secondary | ICD-10-CM

## 2021-11-14 DIAGNOSIS — Z7984 Long term (current) use of oral hypoglycemic drugs: Secondary | ICD-10-CM

## 2021-11-14 DIAGNOSIS — D509 Iron deficiency anemia, unspecified: Secondary | ICD-10-CM

## 2021-11-14 DIAGNOSIS — I10 Essential (primary) hypertension: Secondary | ICD-10-CM | POA: Diagnosis not present

## 2021-12-12 ENCOUNTER — Ambulatory Visit: Payer: Medicare Other | Admitting: Podiatry

## 2021-12-19 ENCOUNTER — Ambulatory Visit: Payer: Medicare Other | Admitting: Podiatry

## 2022-01-09 ENCOUNTER — Telehealth: Payer: Self-pay

## 2022-01-09 NOTE — Telephone Encounter (Signed)
Noted  

## 2022-01-09 NOTE — Telephone Encounter (Signed)
-----   Message from Eugenia Pancoast, [place] sent at 01/09/2022 11:56 AM EDT ----- Regarding: medical clearance dental Received paperwork for med clearance for dental procedure. Pt has toc with me end of November. Can we get her scheduled in prior to a med clearance for this? Form in outbox. Keep toc as scheduled.

## 2022-01-21 ENCOUNTER — Other Ambulatory Visit: Payer: Self-pay

## 2022-01-21 DIAGNOSIS — E119 Type 2 diabetes mellitus without complications: Secondary | ICD-10-CM

## 2022-01-21 MED ORDER — SITAGLIPTIN PHOSPHATE 100 MG PO TABS: 100.0000 mg | ORAL_TABLET | Freq: Every day | ORAL | 0 refills | Status: DC

## 2022-02-04 ENCOUNTER — Other Ambulatory Visit: Payer: Self-pay

## 2022-02-04 NOTE — Telephone Encounter (Signed)
Megan [name] faxed refill request for albuterol inhaler; on med list but does not list last time filled or provider. On Fax sheet from [facility] has originally prescribed albuterol inhaler by [name] but gives no further info about where provider is located or when med filled. Pt last saw Dr Einar Pheasant on 08/08/21 for FU.  Also requesting refill of accuchek fastclix lancets per fax request last refilled # [phone] on 12/07/2020. Pt has an annual/TOC with Eugenia Pancoast FNP on 02/11/2022. Ws not sure if these refill request were generated by pharmacy. I spoke with pt and she said she does not need the lancets and the albuterol inhaler is to have on hand in case she needs it. Pt is not sick at this time. Pt said she is out of the albuterol inhaler but if cannot get refill now pt will discuss with T. Dugal FNP next wk at appt. Sending note to Dutch Quint FNP.

## 2022-02-05 MED ORDER — ALBUTEROL SULFATE HFA 108 (90 BASE) MCG/ACT IN AERS
1.0000 | INHALATION_SPRAY | Freq: Four times a day (QID) | RESPIRATORY_TRACT | 0 refills | Status: DC | PRN
Start: 2022-02-05 — End: 2022-05-21

## 2022-02-11 ENCOUNTER — Encounter: Payer: Medicare Other | Admitting: Family

## 2022-02-11 ENCOUNTER — Encounter: Payer: Medicare Other | Admitting: Family Medicine

## 2022-02-13 ENCOUNTER — Ambulatory Visit (INDEPENDENT_AMBULATORY_CARE_PROVIDER_SITE_OTHER): Payer: Medicare Other | Admitting: Family

## 2022-02-13 ENCOUNTER — Encounter: Payer: Self-pay | Admitting: Family

## 2022-02-13 ENCOUNTER — Encounter: Payer: Self-pay | Admitting: *Deleted

## 2022-02-13 VITALS — BP 138/72 | HR 97 | Temp 98.2°F | Resp 16 | Ht 66.0 in | Wt 180.5 lb

## 2022-02-13 DIAGNOSIS — Z748 Other problems related to care provider dependency: Secondary | ICD-10-CM

## 2022-02-13 DIAGNOSIS — Z789 Other specified health status: Secondary | ICD-10-CM

## 2022-02-13 DIAGNOSIS — E7800 Pure hypercholesterolemia, unspecified: Secondary | ICD-10-CM

## 2022-02-13 DIAGNOSIS — E871 Hypo-osmolality and hyponatremia: Secondary | ICD-10-CM | POA: Diagnosis not present

## 2022-02-13 DIAGNOSIS — R221 Localized swelling, mass and lump, neck: Secondary | ICD-10-CM

## 2022-02-13 DIAGNOSIS — F4321 Adjustment disorder with depressed mood: Secondary | ICD-10-CM

## 2022-02-13 DIAGNOSIS — E213 Hyperparathyroidism, unspecified: Secondary | ICD-10-CM

## 2022-02-13 DIAGNOSIS — K219 Gastro-esophageal reflux disease without esophagitis: Secondary | ICD-10-CM

## 2022-02-13 DIAGNOSIS — D500 Iron deficiency anemia secondary to blood loss (chronic): Secondary | ICD-10-CM | POA: Diagnosis not present

## 2022-02-13 DIAGNOSIS — E119 Type 2 diabetes mellitus without complications: Secondary | ICD-10-CM

## 2022-02-13 DIAGNOSIS — I700 Atherosclerosis of aorta: Secondary | ICD-10-CM

## 2022-02-13 DIAGNOSIS — K7030 Alcoholic cirrhosis of liver without ascites: Secondary | ICD-10-CM

## 2022-02-13 DIAGNOSIS — I10 Essential (primary) hypertension: Secondary | ICD-10-CM

## 2022-02-13 LAB — COMPREHENSIVE METABOLIC PANEL
ALT: 32 U/L (ref 0–35)
AST: 44 U/L — ABNORMAL HIGH (ref 0–37)
Albumin: 4.3 g/dL (ref 3.5–5.2)
Alkaline Phosphatase: 278 U/L — ABNORMAL HIGH (ref 39–117)
BUN: 14 mg/dL (ref 6–23)
CO2: 30 mEq/L (ref 19–32)
Calcium: 10.8 mg/dL — ABNORMAL HIGH (ref 8.4–10.5)
Chloride: 103 mEq/L (ref 96–112)
Creatinine, Ser: 0.7 mg/dL (ref 0.40–1.20)
GFR: 89.5 mL/min (ref >60.00)
Glucose, Bld: 112 mg/dL — ABNORMAL HIGH (ref 70–99)
Potassium: 4.2 mEq/L (ref 3.5–5.1)
Sodium: 139 mEq/L (ref 135–145)
Total Bilirubin: 0.6 mg/dL (ref 0.2–1.2)
Total Protein: 8.2 g/dL (ref 6.0–8.3)

## 2022-02-13 LAB — CBC
HCT: 44 % (ref 36.0–46.0)
Hemoglobin: 14.8 g/dL (ref 12.0–15.0)
MCHC: 33.7 g/dL (ref 30.0–36.0)
MCV: 99.3 fl (ref 78.0–100.0)
Platelets: 210 10*3/uL (ref 150.0–400.0)
RBC: 4.43 Mil/uL (ref 3.87–5.11)
RDW: 15.9 % — ABNORMAL HIGH (ref 11.5–15.5)
WBC: 8.2 10*3/uL (ref 4.0–10.5)

## 2022-02-13 LAB — HEMOGLOBIN A1C: Hgb A1c MFr Bld: 6.2 % (ref 4.6–6.5)

## 2022-02-13 MED ORDER — ASPIRIN 81 MG PO CHEW: 81.0000 mg | CHEWABLE_TABLET | Freq: Every day | ORAL | Status: DC

## 2022-02-13 MED ORDER — DAPAGLIFLOZIN PROPANEDIOL 10 MG PO TABS: 10.0000 mg | ORAL_TABLET | Freq: Every day | ORAL | 1 refills | Status: DC

## 2022-02-13 MED ORDER — OMEPRAZOLE 40 MG PO CPDR: 40.0000 mg | DELAYED_RELEASE_CAPSULE | ORAL | 1 refills | Status: DC

## 2022-02-13 MED ORDER — SITAGLIPTIN PHOSPHATE 100 MG PO TABS: 100.0000 mg | ORAL_TABLET | Freq: Every day | ORAL | 1 refills | Status: DC

## 2022-02-13 MED ORDER — ATORVASTATIN CALCIUM 40 MG PO TABS: 40.0000 mg | ORAL_TABLET | Freq: Every day | ORAL | 1 refills | Status: DC

## 2022-02-13 MED ORDER — LISINOPRIL 20 MG PO TABS: 20.0000 mg | ORAL_TABLET | ORAL | 1 refills | Status: DC

## 2022-02-13 NOTE — Progress Notes (Signed)
Established Patient Office Visit  Subjective:  Patient ID: Megan [name], female    DOB: 12/22/1955  Age: 66 y.o. MRN: [id number]  CC:  Chief Complaint  Patient presents with   Establish Care    Transferring from Dr. Einar Pheasant.    HPI VIVIANNA [name] is here for a transition of care visit.  Prior provider was: Dr. Waunita Schooner  Pt is without acute concerns.   Worsening depression, she states she recently lost her brother. She is worried about her sister as well with the recent loss. her DIL was diagnosed with stage three cancer. Se feels overwhelmed now most of the time. She does admit that she has been struggling with this a bit, she did quit drinking alcohol x 9 months and recently started drinking alcohol again. She finds on average she is having a few shots daily for the last one month. 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 She really wants to stop drinking. She does state she has cirrhosis of the liver as well.   chronic concerns:  Cirrhosis of liver without ascites: was referred in the past to GI however she was unable to make appointment. They were suggesting she get a biopsy but she did not follow through.   Ct liver 03/26/21 hepatic cirrhoisis, single hypovascular mass right lobe.  Endoscopy, ulcers seen in stomach, was put on 4 week course omeprazole 40 mg twice daily.  Was scheduled for liver biopsy 2/323 but did not go. Biopsy was recommended due to elevation of anti smooth muscle antibodies.   Hyperparathyroidism: back in 2019 parathyoid scan was ordered , pt did not complete. Reordered in 2021, pt still did not move forward.   DM2: controlled. Last A1c 6.2   Anemia: stable last visit. No blood in stool that she has seen. No overwhelming fatigue, sob or palpitations.   Past Medical History:  Diagnosis Date   Adopted    Anemia    Arthritis    Asthma    well controlled   Cirrhosis of liver ([place]) 2022   Diabetes  mellitus without complication (HCC)    GERD (gastroesophageal reflux disease)    Hepatitis    per patient was diagnosed with cirrosis due to drinking alcohol but stopped 09/16/20   Hypercholesteremia    Hyperparathyroidism ([place])    Hypertension     Past Surgical History:  Procedure Laterality Date   COLONOSCOPY WITH PROPOFOL N/A 06/25/2020   Procedure: COLONOSCOPY WITH PROPOFOL;  Surgeon: Jonathon Bellows, MD;  Location: [facility] ENDOSCOPY;  Service: Endoscopy;  Laterality: N/A;   ESOPHAGOGASTRODUODENOSCOPY (EGD) WITH PROPOFOL N/A 03/27/2021   Procedure: ESOPHAGOGASTRODUODENOSCOPY (EGD) WITH PROPOFOL;  Surgeon: Lin Landsman, MD;  Location: [facility] ENDOSCOPY;  Service: Gastroenterology;  Laterality: N/A;   TOTAL HIP ARTHROPLASTY Left 01/11/2021   Procedure: TOTAL HIP ARTHROPLASTY ANTERIOR APPROACH;  Surgeon: Lovell Sheehan, MD;  Location: [facility] ORS;  Service: Orthopedics;  Laterality: Left;   TOTAL HIP ARTHROPLASTY Right 05/27/2021   Procedure: TOTAL HIP ARTHROPLASTY ANTERIOR APPROACH;  Surgeon: Lovell Sheehan, MD;  Location: [facility] ORS;  Service: Orthopedics;  Laterality: Right;   TUBAL LIGATION      Family History  Adopted: Yes  Family history unknown: Yes    Social History   Socioeconomic History   Marital status: Married    Spouse name: Not on file   Number of children: 2   Years of education: Not on file   Highest education level:  Not on file  Occupational History   Occupation: retired  Tobacco Use   Smoking status: Every Day    Packs/day: 0.33    Years: 50.00    Total pack years: 16.50    Types: Cigarettes   Smokeless tobacco: Never  Vaping Use   Vaping Use: Never used  Substance and Sexual Activity   Alcohol use: Yes    Alcohol/week: 14.0 standard drinks of alcohol    Types: 14 Shots of liquor per week   Drug use: Never   Sexual activity: Not Currently  Other Topics Concern   Not on file  Social History Narrative   05/17/20   From: [place] originally, moved in 2006    Living: with husband (marital stress) and adult daughter   Work: retired Patent attorney      Family: 2 children - Amie [place] (lives with her) and Largo ([place]) - 2 grandchildren - Academic librarian (lives with him) and Vladimir Creeks       Enjoys: playing games on phone      Exercise: not currently   Diet: low appetite, alcohol       Safety   Seat belts: Yes    Guns: No   Safe in relationships: does not feel safe with husband - has asked him to leave   Social Determinants of Health   Financial Resource Strain: Low Risk  (10/03/2021)   Overall Financial Resource Strain (CARDIA)    Difficulty of Paying Living Expenses: Not hard at all  Food Insecurity: No Food Insecurity (10/03/2021)   Hunger Vital Sign    Worried About Running Out of Food in the Last Year: Never true    [place] in the Last Year: Never true  Transportation Needs: No Transportation Needs (10/03/2021)   PRAPARE - Hydrologist (Medical): No    Lack of Transportation (Non-Medical): No  Physical Activity: Inactive (10/03/2021)   Exercise Vital Sign    Days of Exercise per Week: 0 days    Minutes of Exercise per Session: 0 min  Stress: No Stress Concern Present (10/03/2021)   [place]    Feeling of Stress : Not at all  Social Connections: Not on file  Intimate Partner Violence: Not on file    Outpatient Medications Prior to Visit  Medication Sig Dispense Refill   acetaminophen (TYLENOL) 500 MG tablet Take 1,000 mg by mouth 2 (two) times daily.     albuterol (VENTOLIN HFA) 108 (90 Base) MCG/ACT inhaler Inhale 1-2 puffs into the lungs every 6 (six) hours as needed for wheezing or shortness of breath. 1 each 0   Biotin w/ Vitamins C & E (HAIR/SKIN/NAILS PO) Take 1 capsule by mouth in the morning.     BLACK CURRANT SEED OIL PO Take 1 capsule by mouth in the morning.     blood glucose meter kit and supplies KIT Dispense based on  patient and insurance preference. Use up to four times daily as directed. 1 each 0   cholecalciferol (VITAMIN D3) 25 MCG (1000 UNIT) tablet Take 1,000 Units by mouth daily.     diclofenac Sodium (VOLTAREN) 1 % GEL Apply 1 application topically 2 (two) times daily.     docusate sodium (COLACE) 100 MG capsule Take 100 mg by mouth daily.     ferrous gluconate (FERGON) 240 (27 FE) MG tablet Take 240 mg by mouth daily.     glucose blood test strip  Use as instructed, to test blood sugar twice daily. 100 each 2   Lancets Misc. (ACCU-CHEK FASTCLIX LANCET) KIT Use as directed to test blood sugar twice daily. 1 kit 12   Omega-3 Fatty Acids (FISH OIL PO) Take 1 capsule by mouth daily.     aspirin 81 MG chewable tablet Chew 1 tablet (81 mg total) by mouth 2 (two) times daily. 180 tablet 1   atorvastatin (LIPITOR) 40 MG tablet Take 1 tablet (40 mg total) by mouth daily. (Patient taking differently: Take 40 mg by mouth every morning.) 90 tablet 1   dapagliflozin propanediol (FARXIGA) 10 MG TABS tablet Take 1 tablet (10 mg total) by mouth daily before breakfast. 90 tablet 0   lisinopril (ZESTRIL) 20 MG tablet Take 1 tablet (20 mg total) by mouth every morning. 90 tablet 1   methocarbamol (ROBAXIN) 500 MG tablet Take 1 tablet (500 mg total) by mouth every 6 (six) hours as needed for muscle spasms. 60 tablet 0   omeprazole (PRILOSEC) 40 MG capsule Take 1 capsule (40 mg total) by mouth every morning. 90 capsule 1   sitaGLIPtin (JANUVIA) 100 MG tablet Take 1 tablet (100 mg total) by mouth daily. 90 tablet 0   No facility-administered medications prior to visit.    Allergies  Allergen Reactions   Peanut Butter Flavor Anaphylaxis   Peanut-Containing Drug Products Anaphylaxis        Objective:    Physical Exam Constitutional:      Appearance: Normal appearance. She is obese.  Cardiovascular:     Rate and Rhythm: Normal rate and regular rhythm.  Pulmonary:     Effort: Pulmonary effort is normal.      Breath sounds: Normal breath sounds.  Abdominal:     General: Bowel sounds are normal. There is distension.     Tenderness: There is abdominal tenderness in the right upper quadrant and epigastric area.     Hernia: No hernia is present.  Neurological:     Mental Status: She is alert.      BP 138/72   Pulse 97   Temp 98.2 F (36.8 C)   Resp 16   Ht _0  (1.676 m)   Wt 180 lb 8 oz (81.9 kg)   SpO2 98%   BMI 29.13 kg/m  Wt Readings from Last 3 Encounters:  02/13/22 180 lb 8 oz (81.9 kg)  10/03/21 174 lb (78.9 kg)  08/08/21 170 lb 6 oz (77.3 kg)     Health Maintenance Due  Topic Date Due   Pneumonia Vaccine 46+ Years old (1 - PCV) Never done   DTaP/Tdap/Td (1 - Tdap) Never done   Zoster Vaccines- Shingrix (1 of 2) Never done   DEXA SCAN  Never done   MAMMOGRAM  02/02/2021   INFLUENZA VACCINE  Never done   COVID-19 Vaccine (3 - 2023-24 season) 11/15/2021    There are no preventive care reminders to display for this patient.  No results found for: "TSH" Lab Results  Component Value Date   WBC 8.2 02/13/2022   HGB 14.8 02/13/2022   HCT 44.0 02/13/2022   MCV 99.3 02/13/2022   PLT 210.0 02/13/2022   Lab Results  Component Value Date   NA 139 02/13/2022   K 4.2 02/13/2022   CO2 30 02/13/2022   GLUCOSE 112 (H) 02/13/2022   BUN 14 02/13/2022   CREATININE 0.70 02/13/2022   BILITOT 0.6 02/13/2022   ALKPHOS 278 (H) 02/13/2022   AST 44 (H) 02/13/2022  ALT 32 02/13/2022   PROT 8.2 02/13/2022   ALBUMIN 4.3 02/13/2022   CALCIUM 10.8 (H) 02/13/2022   CALCIUM 11.0 (H) 02/13/2022   ANIONGAP 4 (L) 05/28/2021   EGFR 88 03/05/2021   GFR 89.50 02/13/2022   Lab Results  Component Value Date   CHOL 180 09/20/2020   Lab Results  Component Value Date   HDL 68.20 09/20/2020   Lab Results  Component Value Date   LDLCALC 84 09/20/2020   Lab Results  Component Value Date   TRIG 138.0 09/20/2020   Lab Results  Component Value Date   CHOLHDL 3 09/20/2020   Lab  Results  Component Value Date   HGBA1C 6.2 02/13/2022      Assessment & Plan:   Problem List Items Addressed This Visit       Cardiovascular and Mediastinum   Essential hypertension    Continue asa 81 mg  and lisinopril 20 mg once daily.  stable      Relevant Medications   aspirin 81 MG chewable tablet   atorvastatin (LIPITOR) 40 MG tablet   lisinopril (ZESTRIL) 20 MG tablet   Aortic arch atherosclerosis (HCC)   Relevant Medications   aspirin 81 MG chewable tablet   atorvastatin (LIPITOR) 40 MG tablet   lisinopril (ZESTRIL) 20 MG tablet     Digestive   Gastroesophageal reflux disease without esophagitis    Try to decrease and or avoid spicy foods, fried fatty foods, and also caffeine and chocolate as these can increase heartburn symptoms.  Refill omeprazole 40 mg once daily       Relevant Medications   omeprazole (PRILOSEC) 40 MG capsule   Cirrhosis of liver without ascites ([place])    Advised pt need for f/u with Gi for ongoing cirrhosis Long d/w pt on discontinuation of alcohol as will only further impair liver function      Relevant Orders   Ambulatory referral to Gastroenterology     Endocrine   Hyperparathyroidism ([place])    Reorder pth , pending results      Relevant Orders   PTH, Intact and Calcium (Completed)   Type 2 diabetes mellitus without complication, without long-term current use of insulin ([place])    Ordered hga1c today pending results. Work on diabetic diet and exercise as tolerated. Yearly foot exam, and annual eye exam.  Continue januvia 100 mg and farxiga 10 mg       Relevant Medications   aspirin 81 MG chewable tablet   atorvastatin (LIPITOR) 40 MG tablet   dapagliflozin propanediol (FARXIGA) 10 MG TABS tablet   lisinopril (ZESTRIL) 20 MG tablet   sitaGLIPtin (JANUVIA) 100 MG tablet   Other Relevant Orders   Hemoglobin A1c (Completed)     Other   Assistance needed with transportation - Primary   Iron deficiency anemia   Relevant Orders    CBC (Completed)   Elevated LDL cholesterol level   Relevant Medications   atorvastatin (LIPITOR) 40 MG tablet   Alcohol use   Low sodium levels   Relevant Orders   Comprehensive metabolic panel (Completed)   Grieving   Mass of left side of neck    Ordering u/s thyroid/neck pending results      Relevant Orders   US THYROID (Completed)    Meds ordered this encounter  Medications   aspirin 81 MG chewable tablet    Sig: Chew 1 tablet (81 mg total) by mouth daily.    Order Specific Question:   Supervising Provider  Answer:   BEDSOLE, AMY E [[id number]]   DISCONTD: atorvastatin (LIPITOR) 40 MG tablet    Sig: Take 1 tablet (40 mg total) by mouth daily.    Dispense:  90 tablet    Refill:  1    Do not fill until patient requests. Patient will use up current supply of 20 mg    Order Specific Question:   Supervising Provider    Answer:   BEDSOLE, AMY E [[id number]]   atorvastatin (LIPITOR) 40 MG tablet    Sig: Take 1 tablet (40 mg total) by mouth daily.    Dispense:  90 tablet    Refill:  1    Do not fill until patient requests. Patient will use up current supply of 20 mg    Order Specific Question:   Supervising Provider    Answer:   Diona Browner, AMY E [[id number]]   dapagliflozin propanediol (FARXIGA) 10 MG TABS tablet    Sig: Take 1 tablet (10 mg total) by mouth daily before breakfast.    Dispense:  90 tablet    Refill:  1    Order Specific Question:   Supervising Provider    Answer:   BEDSOLE, AMY E [[id number]]   lisinopril (ZESTRIL) 20 MG tablet    Sig: Take 1 tablet (20 mg total) by mouth every morning.    Dispense:  90 tablet    Refill:  1    Order Specific Question:   Supervising Provider    Answer:   BEDSOLE, AMY E [[id number]]   omeprazole (PRILOSEC) 40 MG capsule    Sig: Take 1 capsule (40 mg total) by mouth every morning.    Dispense:  90 capsule    Refill:  1    Order Specific Question:   Supervising Provider    Answer:   BEDSOLE, AMY E [[id number]]   sitaGLIPtin (JANUVIA) 100 MG tablet     Sig: Take 1 tablet (100 mg total) by mouth daily.    Dispense:  90 tablet    Refill:  1    Order Specific Question:   Supervising Provider    Answer:   BEDSOLE, AMY E [[id number]]    Follow-up: No follow-ups on file.    Eugenia Pancoast, FNP

## 2022-02-13 NOTE — Patient Instructions (Addendum)
  Stop by the lab prior to leaving today. I will notify you of your results once received.   Please call Dr. Verlin Grills office to get scheduled for overdue follow up for your cirrhosis.    ------------------------------------ I have ordered imaging for you at [facility] outpatient diagnostic center for an ultrasound of the neck and thyroid. This order has been sent over for you electronically.   Please call [phone] to schedule this appointment.   ------------------------------------  Welcome to our clinic, I am happy to have you as my new patient. I am excited to continue on this healthcare journey with you.  Stop by the lab prior to leaving today. I will notify you of your results once received.   Please keep in mind Any my chart messages you send have up to a three business day turnaround for a response.  Phone calls may take up to a one full business day turnaround for a  response.   If you need a medication refill I recommend you request it through the pharmacy as this is easiest for [place] rather than sending a message and or phone call.   Due to recent changes in healthcare laws, you may see results of your imaging and/or laboratory studies on MyChart before I have had a chance to review them.  I understand that in some cases there may be results that are confusing or concerning to you. Please understand that not all results are received at the same time and often I may need to interpret multiple results in order to provide you with the best plan of care or course of treatment. Therefore, I ask that you please give me 2 business days to thoroughly review all your results before contacting my office for clarification. Should we see a critical lab result, you will be contacted sooner.   It was a pleasure seeing you today! Please do not hesitate to reach out with any questions and or concerns.  Regards,   Eugenia Pancoast FNP-C

## 2022-02-14 ENCOUNTER — Other Ambulatory Visit (INDEPENDENT_AMBULATORY_CARE_PROVIDER_SITE_OTHER): Payer: Medicare Other

## 2022-02-14 ENCOUNTER — Telehealth: Payer: Self-pay

## 2022-02-14 ENCOUNTER — Other Ambulatory Visit: Payer: Self-pay | Admitting: Family

## 2022-02-14 LAB — VITAMIN D 25 HYDROXY (VIT D DEFICIENCY, FRACTURES): VITD: 69.28 ng/mL (ref 30.00–100.00)

## 2022-02-14 NOTE — Progress Notes (Signed)
Can we please add vitamin D? For high calcium. Order future

## 2022-02-14 NOTE — Progress Notes (Signed)
Chronic Care Management Pharmacy Assistant   Name: Megan [name]  MRN: [id number] DOB: 29-Nov-1955  Reason for Encounter: CCM (General Adherence)  Recent office visits:  02/13/22 Eugenia Pancoast, NP Establish Care Abnormal Labs: " Liver function stable, alkaline phos however still higher than desired.Please see GI again for management and control of cirrhosis, and as we discussed recommend to stop drinking." Change: Aspiring to one time daily Stop (completed): Methocarbamol 500 mg  Recent consult visits:  None since last CCM contact  Hospital visits:  None in previous 6 months  Medications: Outpatient Encounter Medications as of 02/14/2022  Medication Sig   acetaminophen (TYLENOL) 500 MG tablet Take 1,000 mg by mouth 2 (two) times daily.   albuterol (VENTOLIN HFA) 108 (90 Base) MCG/ACT inhaler Inhale 1-2 puffs into the lungs every 6 (six) hours as needed for wheezing or shortness of breath.   aspirin 81 MG chewable tablet Chew 1 tablet (81 mg total) by mouth daily.   atorvastatin (LIPITOR) 40 MG tablet Take 1 tablet (40 mg total) by mouth daily.   Biotin w/ Vitamins C & E (HAIR/SKIN/NAILS PO) Take 1 capsule by mouth in the morning.   BLACK CURRANT SEED OIL PO Take 1 capsule by mouth in the morning.   blood glucose meter kit and supplies KIT Dispense based on patient and insurance preference. Use up to four times daily as directed.   cholecalciferol (VITAMIN D3) 25 MCG (1000 UNIT) tablet Take 1,000 Units by mouth daily.   dapagliflozin propanediol (FARXIGA) 10 MG TABS tablet Take 1 tablet (10 mg total) by mouth daily before breakfast.   diclofenac Sodium (VOLTAREN) 1 % GEL Apply 1 application topically 2 (two) times daily.   docusate sodium (COLACE) 100 MG capsule Take 100 mg by mouth daily.   ferrous gluconate (FERGON) 240 (27 FE) MG tablet Take 240 mg by mouth daily.   glucose blood test strip Use as instructed, to test blood sugar twice daily.   Lancets Misc. (ACCU-CHEK  FASTCLIX LANCET) KIT Use as directed to test blood sugar twice daily.   lisinopril (ZESTRIL) 20 MG tablet Take 1 tablet (20 mg total) by mouth every morning.   Omega-3 Fatty Acids (FISH OIL PO) Take 1 capsule by mouth daily.   omeprazole (PRILOSEC) 40 MG capsule Take 1 capsule (40 mg total) by mouth every morning.   sitaGLIPtin (JANUVIA) 100 MG tablet Take 1 tablet (100 mg total) by mouth daily.   No facility-administered encounter medications on file as of 02/14/2022.    Recent vitals BP Readings from Last 3 Encounters:  02/13/22 138/72  08/08/21 130/62  05/29/21 126/62   Pulse Readings from Last 3 Encounters:  02/13/22 97  08/08/21 78  05/29/21 95   Wt Readings from Last 3 Encounters:  02/13/22 180 lb 8 oz (81.9 kg)  10/03/21 174 lb (78.9 kg)  08/08/21 170 lb 6 oz (77.3 kg)   BMI Readings from Last 3 Encounters:  02/13/22 29.13 kg/m  10/03/21 28.08 kg/m  08/08/21 27.50 kg/m    Recent lab results    Component Value Date/Time   NA 139 02/13/2022 1118   NA 135 03/05/2021 0925   K 4.2 02/13/2022 1118   CL 103 02/13/2022 1118   CO2 30 02/13/2022 1118   GLUCOSE 112 (H) 02/13/2022 1118   BUN 14 02/13/2022 1118   BUN 13 03/05/2021 0925   CREATININE 0.70 02/13/2022 1118   CALCIUM 10.8 (H) 02/13/2022 1118    Lab Results  Component Value Date  CREATININE 0.70 02/13/2022   GFR 89.50 02/13/2022   EGFR 88 03/05/2021   GFRNONAA >60 05/28/2021   Lab Results  Component Value Date/Time   HGBA1C 6.2 02/13/2022 11:18 AM   HGBA1C 6.2 (A) 08/08/2021 09:40 AM   HGBA1C 6.7 (A) 04/10/2021 10:07 AM   HGBA1C 5.8 12/12/2019 12:00 AM   MICROALBUR <0.7 04/10/2021 10:14 AM   MICROALBUR Negative 12/12/2019 12:00 AM    Lab Results  Component Value Date   CHOL 180 09/20/2020   HDL 68.20 09/20/2020   LDLCALC 84 09/20/2020   TRIG 138.0 09/20/2020   CHOLHDL 3 09/20/2020   Contacted Hassan Rowan D Cagley on 02/26/2022 for general disease state and medication adherence call.   What  concerns do you have about your medications? No concerns at this time.   The patient denies side effects with their medications.   How often do you forget or accidentally miss a dose? Never  Do you use a pillbox? No  Are you having any problems getting your medications from your pharmacy? No  Has the cost of your medications been a concern? No  Since last visit with CPP, no interventions have been made.   The patient has not had an ED visit since last contact.   The patient denies problems with their health.   Patient denies concerns or questions for Charlene Brooke, PharmD at this time.   Care Gaps: Annual wellness visit in last year? Yes 10/03/2021 Most Recent BP reading: 138/72 on 02/13/2022  If Diabetic: Last eye exam / retinopathy screening: Up to date Last diabetic foot exam: Up to date Last UACR: Not completed  Star Rating Drugs:  Medication:  Last Fill: Day Supply Atorvastatin 51m 01/20/2022 30 Lisinopril 20 mg 02/03/2022 30 Januvia 100 mg 02/03/2022 30 Farxiga 10 mg  02/03/2022 30  Summary of recommendations from last [place]visit (Date:10/18/2021) Summary: CCM F/U visit -Reviewed medications; pt affirms compliance -Reviewed FWilder Glade- it is not on med list (Rx expired July 2023) but pt is still taking it; per chart review it should be continued   Recommendations/Changes made from today's visit: -Refilled Farxiga 10 mg via office refill protocol   Plan: -[place]reentreewill call patient 3 months for adherence review -Pharmacist follow up televisit scheduled for 1 year -PCP 02/11/22 (Dr CEinar Pheasant- needs rescheduling)   Upcoming appointments: Diabetic Foot Care appointment on 03/03/2022  LCharlene Brooke CPP notified  AMarijean Niemann RStapletonAssistant [phone]

## 2022-02-15 LAB — PTH, INTACT AND CALCIUM
Calcium: 11 mg/dL — ABNORMAL HIGH (ref 8.6–10.4)
PTH: 173 pg/mL — ABNORMAL HIGH (ref 16–77)

## 2022-02-18 ENCOUNTER — Other Ambulatory Visit: Payer: Self-pay | Admitting: Family

## 2022-02-18 DIAGNOSIS — E213 Hyperparathyroidism, unspecified: Secondary | ICD-10-CM

## 2022-02-18 DIAGNOSIS — R221 Localized swelling, mass and lump, neck: Secondary | ICD-10-CM

## 2022-02-18 NOTE — Progress Notes (Signed)
Parathyroid still pretty elevated. Please let pt know I have referred her to endo for eval/treat

## 2022-02-20 ENCOUNTER — Ambulatory Visit
Admission: RE | Admit: 2022-02-20 | Discharge: 2022-02-20 | Disposition: A | Discharge status: Home / Self Care | Payer: Medicare Other | Source: Ambulatory Visit | Attending: Family | Admitting: Family

## 2022-02-20 DIAGNOSIS — R221 Localized swelling, mass and lump, neck: Secondary | ICD-10-CM | POA: Insufficient documentation

## 2022-02-23 NOTE — Assessment & Plan Note (Signed)
Ordering u/s thyroid/neck pending results

## 2022-02-23 NOTE — Assessment & Plan Note (Signed)
Advised pt need for f/u with Gi for ongoing cirrhosis Long d/w pt on discontinuation of alcohol as will only further impair liver function

## 2022-02-23 NOTE — Assessment & Plan Note (Signed)
Ordered hga1c today pending results. Work on diabetic diet and exercise as tolerated. Yearly foot exam, and annual eye exam.  Continue januvia 100 mg and farxiga 10 mg

## 2022-02-23 NOTE — Assessment & Plan Note (Signed)
Continue asa 81 mg  and lisinopril 20 mg once daily.  stable

## 2022-02-23 NOTE — Assessment & Plan Note (Signed)
Try to decrease and or avoid spicy foods, fried fatty foods, and also caffeine and chocolate as these can increase heartburn symptoms.  Refill omeprazole 40 mg once daily

## 2022-02-23 NOTE — Assessment & Plan Note (Signed)
Reorder pth , pending results

## 2022-02-25 ENCOUNTER — Ambulatory Visit (INDEPENDENT_AMBULATORY_CARE_PROVIDER_SITE_OTHER): Payer: Medicare Other | Admitting: Nurse Practitioner

## 2022-02-25 ENCOUNTER — Ambulatory Visit: Payer: Medicare Other | Admitting: Family

## 2022-02-25 ENCOUNTER — Encounter: Payer: Self-pay | Admitting: Nurse Practitioner

## 2022-02-25 VITALS — BP 138/74 | HR 82 | Temp 98.2°F | Resp 16 | Ht 66.0 in | Wt 183.1 lb

## 2022-02-25 DIAGNOSIS — I10 Essential (primary) hypertension: Secondary | ICD-10-CM | POA: Diagnosis not present

## 2022-02-25 DIAGNOSIS — E119 Type 2 diabetes mellitus without complications: Secondary | ICD-10-CM

## 2022-02-25 DIAGNOSIS — Z01818 Encounter for other preprocedural examination: Secondary | ICD-10-CM | POA: Diagnosis not present

## 2022-02-25 MED ORDER — TRAMADOL HCL 50 MG PO TABS: 50.0000 mg | ORAL_TABLET | Freq: Three times a day (TID) | ORAL | 0 refills | Status: AC | PRN

## 2022-02-25 NOTE — Patient Instructions (Signed)
Nice to see you today I have cleared you to have the dental procedure done I will reach out to Dr. Marcello Moores in regards to the pain medication

## 2022-02-25 NOTE — Assessment & Plan Note (Signed)
Patient's last A1c 6.2%.  He taking medication as prescribed patient is well-controlled discussed the role of diabetes and slow wound healing

## 2022-02-25 NOTE — Assessment & Plan Note (Signed)
Patient needs medical clearance prior to having 7 teeth extracted at a local dentist office.  Form was received filled out and faxed back to dentist.  He is cleared to have procedure.  Did mention that they only write antibiotics and no pain medication.  Patient is concerned that she is only having several teeth removed.  Patient does have history of liver disease in her chart we will send in short prescription of pain medication that patient can use as needed after procedure.

## 2022-02-25 NOTE — Progress Notes (Signed)
   Established Patient Office Visit  Subjective   Patient ID: Megan [name], female    DOB: 09/08/1955  Age: 66 y.o. MRN: [id number]  Chief Complaint  Patient presents with  . Medical Clearance    Dental     HPI  Medical Clearance: History of HTN, Cirrhosis of the liver, DM2, hyperparathyroidism. HLD. Tobacco use  States that she is having 7 teeth and having partial made.   HTN: does not check BP at home. Tolerates BP medication well.  Patient currently on lisinopril  States that she does not check sugar at home. She is on [place].  Last A1c 6.2%  Having procedure with Dr. Marcello Moores at [place]. Scheduled for 03/04/2022. She was told for [place] to contact his office in regards to the pain medication      Review of Systems  Constitutional:  Negative for chills and fever.  Respiratory:  Negative for shortness of breath.   Cardiovascular:  Negative for chest pain.      Objective:     BP 138/74   Pulse 82   Temp 98.2 F (36.8 C)   Resp 16   Ht '5\' 6"'$  (1.676 m)   Wt 183 lb 2 oz (83.1 kg)   SpO2 98%   BMI 29.56 kg/m  BP Readings from Last 3 Encounters:  02/25/22 138/74  02/13/22 138/72  08/08/21 130/62   Wt Readings from Last 3 Encounters:  02/25/22 183 lb 2 oz (83.1 kg)  02/13/22 180 lb 8 oz (81.9 kg)  10/03/21 174 lb (78.9 kg)      Physical Exam Vitals and nursing note reviewed.  Constitutional:      Appearance: Normal appearance.  Neck:     Thyroid: Thyromegaly present.  Cardiovascular:     Rate and Rhythm: Normal rate and regular rhythm.     Heart sounds: Normal heart sounds.  Pulmonary:     Effort: Pulmonary effort is normal.     Breath sounds: Normal breath sounds.  Neurological:     Mental Status: She is alert.     No results found for any visits on 02/25/22.    The 10-year ASCVD risk score (Arnett DK, et al., 2019) is: 41.8%    Assessment & Plan:   Problem List Items Addressed This Visit       Cardiovascular and Mediastinum    Essential hypertension - Primary    Patient currently maintained on lisinopril 20 mg daily.  Blood pressure within normal limits continue medication as prescribed        Endocrine   Type 2 diabetes mellitus without complication, without long-term current use of insulin ([place])    Patient's last A1c 6.2%.  He taking medication as prescribed patient is well-controlled discussed the role of diabetes and slow wound healing        Other   Pre-operative clearance    Patient needs medical clearance prior to having 7 teeth extracted at a local dentist office.  Form was received filled out and faxed back to dentist.  He is cleared to have procedure.  Did mention that they only write antibiotics and no pain medication.  Patient is concerned that she is only having several teeth removed.  Patient does have history of liver disease in her chart we will send in short prescription of pain medication that patient can use as needed after procedure.       Return if symptoms worsen or fail to improve.    Romilda Garret, NP

## 2022-02-25 NOTE — Assessment & Plan Note (Signed)
Patient currently maintained on lisinopril 20 mg daily.  Blood pressure within normal limits continue medication as prescribed

## 2022-02-25 NOTE — Progress Notes (Signed)
   Established Patient Office Visit  Subjective   Patient ID: Megan [name], female    DOB: Apr 17, 1955  Age: 66 y.o. MRN: [id number]  Chief Complaint  Patient presents with   Medical Clearance    Dental     HPI  Medical Clearance: History of HTN, Cirrhosis of the liver, DM2, hyperparathyroidism. HLD. Tobacco use  States that she is having 7 teeth and having partial made.   HTN: does not check BP at home. Tolerates BP medication well.  Patient currently on lisinopril  States that she does not check sugar at home. She is on [place].  Last A1c 6.2%  Having procedure with Dr. Marcello Moores at [place]. Scheduled for 03/04/2022. She was told for [place] to contact his office in regards to the pain medication      Review of Systems  Constitutional:  Negative for chills and fever.  Respiratory:  Negative for shortness of breath.   Cardiovascular:  Negative for chest pain.      Objective:     BP 138/74   Pulse 82   Temp 98.2 F (36.8 C)   Resp 16   Ht '5\' 6"'$  (1.676 m)   Wt 183 lb 2 oz (83.1 kg)   SpO2 98%   BMI 29.56 kg/m  BP Readings from Last 3 Encounters:  02/25/22 138/74  02/13/22 138/72  08/08/21 130/62   Wt Readings from Last 3 Encounters:  02/25/22 183 lb 2 oz (83.1 kg)  02/13/22 180 lb 8 oz (81.9 kg)  10/03/21 174 lb (78.9 kg)      Physical Exam Vitals and nursing note reviewed.  Constitutional:      Appearance: Normal appearance.  Neck:     Thyroid: Thyromegaly present.  Cardiovascular:     Rate and Rhythm: Normal rate and regular rhythm.     Heart sounds: Normal heart sounds.  Pulmonary:     Effort: Pulmonary effort is normal.     Breath sounds: Normal breath sounds.  Neurological:     Mental Status: She is alert.      No results found for any visits on 02/25/22.    The 10-year ASCVD risk score (Arnett DK, et al., 2019) is: 41.8%    Assessment & Plan:   Problem List Items Addressed This Visit       Cardiovascular and Mediastinum    Essential hypertension - Primary    Patient currently maintained on lisinopril 20 mg daily.  Blood pressure within normal limits continue medication as prescribed        Endocrine   Type 2 diabetes mellitus without complication, without long-term current use of insulin ([place])    Patient's last A1c 6.2%.  He taking medication as prescribed patient is well-controlled discussed the role of diabetes and slow wound healing        Other   Pre-operative clearance    Patient needs medical clearance prior to having 7 teeth extracted at a local dentist office.  Form was received filled out and faxed back to dentist.  He is cleared to have procedure.  Did mention that they only write antibiotics and no pain medication.  Patient is concerned that she is only having several teeth removed.  Patient does have history of liver disease in her chart we will send in short prescription of pain medication that patient can use as needed after procedure.       Return if symptoms worsen or fail to improve.    Romilda Garret, NP

## 2022-02-26 ENCOUNTER — Telehealth: Payer: Self-pay | Admitting: Family

## 2022-02-26 NOTE — Telephone Encounter (Signed)
Patient called in and stated that she seen [facility] yesterday and would like to switch her care over to him. She stated that she like the rapport they had together yesterday. Please advise. Thank you!

## 2022-02-26 NOTE — Telephone Encounter (Signed)
This has to be approved by both providers. We will have to wait until Megan [name] is back in office

## 2022-02-28 NOTE — Telephone Encounter (Signed)
Spoke to pt, told pt Matt's response. No questions/concerns

## 2022-03-03 ENCOUNTER — Ambulatory Visit (INDEPENDENT_AMBULATORY_CARE_PROVIDER_SITE_OTHER): Payer: Medicare Other | Admitting: Podiatry

## 2022-03-03 ENCOUNTER — Encounter: Payer: Self-pay | Admitting: Podiatry

## 2022-03-03 VITALS — BP 150/86

## 2022-03-03 DIAGNOSIS — L84 Corns and callosities: Secondary | ICD-10-CM | POA: Diagnosis not present

## 2022-03-03 DIAGNOSIS — M79609 Pain in unspecified limb: Secondary | ICD-10-CM | POA: Diagnosis not present

## 2022-03-03 DIAGNOSIS — B351 Tinea unguium: Secondary | ICD-10-CM | POA: Diagnosis not present

## 2022-03-03 DIAGNOSIS — Q828 Other specified congenital malformations of skin: Secondary | ICD-10-CM

## 2022-03-03 DIAGNOSIS — E1151 Type 2 diabetes mellitus with diabetic peripheral angiopathy without gangrene: Secondary | ICD-10-CM

## 2022-03-03 NOTE — Progress Notes (Signed)
This patient returns to my office for at risk foot care.  This patient requires this care by a professional since this patient will be at risk due to having diabetes.   Patient has painful callus on both forefeet.  This patient is unable to cut nails himself since the patient cannot reach his nails.These nails are painful walking and wearing shoes.  This patient presents for at risk foot care today.  General Appearance  Alert, conversant and in no acute stress.  Vascular  Dorsalis pedis and posterior tibial  pulses are  weakly palpable  bilaterally.  Capillary return is within normal limits  bilaterally. Temperature is within normal limits  bilaterally.  Neurologic  Senn-Weinstein monofilament wire test within normal limits  bilaterally. Muscle power within normal limits bilaterally.  Nails Thick disfigured discolored nails with subungual debris  from hallux to fifth toes bilaterally. No evidence of bacterial infection or drainage bilaterally.  Orthopedic  No limitations of motion  feet .  No crepitus or effusions noted.  No bony pathology or digital deformities noted.  Skin  normotropic skin  noted bilaterally.  No signs of infections or ulcers noted.   Porokeratosis sub 4 left and sub 1, 4 right foot.  Onychomycosis  Pain in right toes  Pain in left toes  Porokeratosis  B/L.  Consent was obtained for treatment procedures.   Mechanical debridement of nails 1-5  bilaterally performed with a nail nipper.  Filed with dremel without incident. Debride porokeratosis with # 15 blade followed by dremel tool.   Return office visit    10 weeks                 Told patient to return for periodic foot care and evaluation due to potential at risk complications.   Lilliahna Schubring DPM   

## 2022-03-04 ENCOUNTER — Telehealth: Payer: Self-pay | Admitting: Family

## 2022-03-04 NOTE — Telephone Encounter (Signed)
Paulita Cradle from [facility] called stating that patient is having some extractions done today,and would like to know how much tylenol she can take in a 3to5 day period. Contact number :[phone]

## 2022-03-04 NOTE — Telephone Encounter (Signed)
Called and spoke to Wilkeson from [facility]. Informed him that she should avoid tylenol as she has a history of cirrhosis and I had sent in tramadol for pain relief in regards to her dental extractions

## 2022-03-18 NOTE — Telephone Encounter (Signed)
I am ok with this, and happy the patient had a good rapport. Thank you Matt for reaching out.

## 2022-03-18 NOTE — Telephone Encounter (Signed)
Patient has been scheduled

## 2022-05-15 ENCOUNTER — Encounter: Payer: Self-pay | Admitting: Podiatry

## 2022-05-15 ENCOUNTER — Ambulatory Visit (INDEPENDENT_AMBULATORY_CARE_PROVIDER_SITE_OTHER): Payer: 59 | Admitting: Podiatry

## 2022-05-15 DIAGNOSIS — M79609 Pain in unspecified limb: Secondary | ICD-10-CM

## 2022-05-15 DIAGNOSIS — M79676 Pain in unspecified toe(s): Secondary | ICD-10-CM | POA: Diagnosis not present

## 2022-05-15 DIAGNOSIS — Q828 Other specified congenital malformations of skin: Secondary | ICD-10-CM | POA: Diagnosis not present

## 2022-05-15 DIAGNOSIS — E1151 Type 2 diabetes mellitus with diabetic peripheral angiopathy without gangrene: Secondary | ICD-10-CM | POA: Diagnosis not present

## 2022-05-15 DIAGNOSIS — B351 Tinea unguium: Secondary | ICD-10-CM | POA: Diagnosis not present

## 2022-05-15 NOTE — Progress Notes (Signed)
This patient returns to my office for at risk foot care.  This patient requires this care by a professional since this patient will be at risk due to having diabetes.   Patient has painful callus on both forefeet.  This patient is unable to cut nails himself since the patient cannot reach his nails.These nails are painful walking and wearing shoes.  This patient presents for at risk foot care today.  General Appearance  Alert, conversant and in no acute stress.  Vascular  Dorsalis pedis and posterior tibial  pulses are  weakly palpable  bilaterally.  Capillary return is within normal limits  bilaterally. Temperature is within normal limits  bilaterally.  Neurologic  Senn-Weinstein monofilament wire test within normal limits  bilaterally. Muscle power within normal limits bilaterally.  Nails Thick disfigured discolored nails with subungual debris  from hallux to fifth toes bilaterally. No evidence of bacterial infection or drainage bilaterally.  Orthopedic  No limitations of motion  feet .  No crepitus or effusions noted.  No bony pathology or digital deformities noted.  Skin  normotropic skin  noted bilaterally.  No signs of infections or ulcers noted.   Porokeratosis sub 4 left and sub 1, 4 right foot.  Onychomycosis  Pain in right toes  Pain in left toes  Porokeratosis  B/L.  Consent was obtained for treatment procedures.   Mechanical debridement of nails 1-5  bilaterally performed with a nail nipper.  Filed with dremel without incident. Debride porokeratosis with # 15 blade followed by dremel tool.   Return office visit    10 weeks                 Told patient to return for periodic foot care and evaluation due to potential at risk complications.   Gardiner Barefoot DPM

## 2022-05-21 ENCOUNTER — Ambulatory Visit (INDEPENDENT_AMBULATORY_CARE_PROVIDER_SITE_OTHER): Payer: 59 | Admitting: Nurse Practitioner

## 2022-05-21 ENCOUNTER — Telehealth: Payer: Self-pay | Admitting: Nurse Practitioner

## 2022-05-21 ENCOUNTER — Encounter: Payer: Self-pay | Admitting: Nurse Practitioner

## 2022-05-21 VITALS — BP 132/64 | HR 88 | Temp 98.8°F | Resp 16 | Ht 66.0 in | Wt 184.2 lb

## 2022-05-21 DIAGNOSIS — E213 Hyperparathyroidism, unspecified: Secondary | ICD-10-CM

## 2022-05-21 DIAGNOSIS — E119 Type 2 diabetes mellitus without complications: Secondary | ICD-10-CM | POA: Diagnosis not present

## 2022-05-21 DIAGNOSIS — I10 Essential (primary) hypertension: Secondary | ICD-10-CM

## 2022-05-21 DIAGNOSIS — D500 Iron deficiency anemia secondary to blood loss (chronic): Secondary | ICD-10-CM

## 2022-05-21 DIAGNOSIS — Z1231 Encounter for screening mammogram for malignant neoplasm of breast: Secondary | ICD-10-CM

## 2022-05-21 LAB — COMPREHENSIVE METABOLIC PANEL
ALT: 29 U/L (ref 0–35)
AST: 58 U/L — ABNORMAL HIGH (ref 0–37)
Albumin: 4 g/dL (ref 3.5–5.2)
Alkaline Phosphatase: 262 U/L — ABNORMAL HIGH (ref 39–117)
BUN: 11 mg/dL (ref 6–23)
CO2: 25 mEq/L (ref 19–32)
Calcium: 11.6 mg/dL — ABNORMAL HIGH (ref 8.4–10.5)
Chloride: 102 mEq/L (ref 96–112)
Creatinine, Ser: 0.81 mg/dL (ref 0.40–1.20)
GFR: 74.98 mL/min (ref >60.00)
Glucose, Bld: 82 mg/dL (ref 70–99)
Potassium: 3.8 mEq/L (ref 3.5–5.1)
Sodium: 137 mEq/L (ref 135–145)
Total Bilirubin: 0.6 mg/dL (ref 0.2–1.2)
Total Protein: 8.4 g/dL — ABNORMAL HIGH (ref 6.0–8.3)

## 2022-05-21 LAB — POCT GLYCOSYLATED HEMOGLOBIN (HGB A1C): Hemoglobin A1C: 6.9 % — AB (ref 4.0–5.6)

## 2022-05-21 LAB — CBC
HCT: 40.8 % (ref 36.0–46.0)
Hemoglobin: 13.9 g/dL (ref 12.0–15.0)
MCHC: 33.9 g/dL (ref 30.0–36.0)
MCV: 98.5 fl (ref 78.0–100.0)
Platelets: 191 10*3/uL (ref 150.0–400.0)
RBC: 4.14 Mil/uL (ref 3.87–5.11)
RDW: 14.4 % (ref 11.5–15.5)
WBC: 9.1 10*3/uL (ref 4.0–10.5)

## 2022-05-21 MED ORDER — ALBUTEROL SULFATE HFA 108 (90 BASE) MCG/ACT IN AERS: 1.0000 | INHALATION_SPRAY | Freq: Four times a day (QID) | RESPIRATORY_TRACT | 0 refills | Status: AC | PRN

## 2022-05-21 NOTE — Assessment & Plan Note (Signed)
Patient has history of parathyroid disease also has a multi nodule goiter.  Will recheck calcium and PTH levels today

## 2022-05-21 NOTE — Telephone Encounter (Signed)
Noticed patient had [place] of thyroid done and they recommended that she have a biopsy. Not sure if she ever saw tabitha in office again. I feel like she needs to have this done to make sure the nodules are not cancerous. Would she be willing to do such?

## 2022-05-21 NOTE — Patient Instructions (Signed)
Nice to see you today I will be in touch with the labs I want to see you in 4 months for a sugar recheck, sooner if you need me

## 2022-05-21 NOTE — Assessment & Plan Note (Signed)
Patient currently maintained on Farxiga 10 mg and Januvia 100 mg.  Patient's A1c was 6.9% which is an elevation.  Patient will continue taking medications work on lifestyle modifications follow-up in 3 months for diabetes recheck

## 2022-05-21 NOTE — Assessment & Plan Note (Signed)
Patient currently maintained on lisinopril 20 mg.  Blood pressure within normal limits.  Continue medication as prescribed

## 2022-05-21 NOTE — Assessment & Plan Note (Signed)
Pending PTH, calcium levels.  Patient's vitamin D was checked in the past and was within normal limits

## 2022-05-21 NOTE — Telephone Encounter (Signed)
Called pt and she said that she is not interested in it right now, and if she changes her mind later on she would let us know.

## 2022-05-21 NOTE — Assessment & Plan Note (Signed)
History of the same patient is on ferrous gluconate over-the-counter supplementation.  Continue taking that as recommended

## 2022-05-21 NOTE — Progress Notes (Signed)
Established Patient Office Visit  Subjective   Patient ID: Megan [name], female    DOB: 1955-11-20  Age: 67 y.o. MRN: [id number]  Chief Complaint  Patient presents with   [place] from [place]    HPI  Transfer of care: last seen by me on 02/25/2022 for pre-op clearnace  States that he husband pasted approx 1 month ago and she is stressed.   DM2: States that she does not check her sugar at home. States that she would check it regular. No low sugars. States that she felt like it did go up.  She is currently maintained on [place]   HTN: does not check blood pressure at home.  Patient currently maintained on lisinopril 20 mg  GERD: states that it does control it. No heartburn with the medication. Had EGD  Cirrhosis: States that she has an appointmet next month with a liver clinic. Dr Marius Ditch   Asthma: states that she does not use it once a month. States no PND. State that it is seasonally with the pollen   Tdap: Get at local pharmacy Flu: refused Covid: moderna x2 Pna: refused  Shingles: Discussed in office will get local pharmacy  Colonoscopy: 06/25/2020, recall in 7 years.  Due 2029 Pap smear: aged out  Mammogram: Not up-to-date order placed and patient given information to call and set up mammogram.    Review of Systems  Constitutional:  Negative for chills and fever.  Respiratory:  Negative for shortness of breath.   Cardiovascular:  Positive for leg swelling (ankles on occasion). Negative for chest pain.  Gastrointestinal:  Negative for abdominal pain, blood in stool, constipation, diarrhea, nausea and vomiting.       Bm daily   Genitourinary:  Negative for dysuria and hematuria.  Neurological:  Negative for headaches.  Psychiatric/Behavioral:  Negative for hallucinations and suicidal ideas.       Objective:     BP 132/64   Pulse 88   Temp 98.8 F (37.1 C)   Resp 16   Ht '5\' 6"'$  (1.676 m)   Wt 184 lb 4 oz (83.6 kg)    SpO2 97%   BMI 29.74 kg/m    Physical Exam Vitals and nursing note reviewed.  Constitutional:      Appearance: Normal appearance.  HENT:     Mouth/Throat:     Mouth: Mucous membranes are moist.     Pharynx: Oropharynx is clear.  Neck:     Thyroid: Thyromegaly present.  Cardiovascular:     Rate and Rhythm: Normal rate and regular rhythm.     Pulses:          Dorsalis pedis pulses are 2+ on the right side and 2+ on the left side.       Posterior tibial pulses are 2+ on the right side and 2+ on the left side.     Heart sounds: Normal heart sounds.  Pulmonary:     Effort: Pulmonary effort is normal.     Breath sounds: Normal breath sounds.  Musculoskeletal:     Right lower leg: Edema present.     Left lower leg: No edema.       Feet:  Feet:     Right foot:     Skin integrity: Skin integrity normal.     Toenail Condition: Right toenails are normal.     Left foot:     Skin integrity: Skin integrity normal.     Toenail Condition:  Left toenails are normal.  Lymphadenopathy:     Cervical: No cervical adenopathy.  Neurological:     Mental Status: She is alert.      Results for orders placed or performed in visit on 05/21/22  POCT glycosylated hemoglobin (Hb A1C)  Result Value Ref Range   Hemoglobin A1C 6.9 (A) 4.0 - 5.6 %   HbA1c POC (<> result, manual entry)     HbA1c, POC (prediabetic range)     HbA1c, POC (controlled diabetic range)        The 10-year ASCVD risk score (Arnett DK, et al., 2019) is: 38.7%    Assessment & Plan:   Problem List Items Addressed This Visit       Cardiovascular and Mediastinum   Essential hypertension    Patient currently maintained on lisinopril 20 mg.  Blood pressure within normal limits.  Continue medication as prescribed        Endocrine   Hyperparathyroidism [facility])    Patient has history of parathyroid disease also has a multi nodule goiter.  Will recheck calcium and PTH levels today      Relevant Orders   PTH, intact  and calcium   Type 2 diabetes mellitus without complication, without long-term current use of insulin ([facility]) - Primary    Patient currently maintained on Farxiga 10 mg and Januvia 100 mg.  Patient's A1c was 6.9% which is an elevation.  Patient will continue taking medications work on lifestyle modifications follow-up in 3 months for diabetes recheck      Relevant Orders   POCT glycosylated hemoglobin (Hb A1C) (Completed)   CBC   Comprehensive metabolic panel     Other   Iron deficiency anemia    History of the same patient is on ferrous gluconate over-the-counter supplementation.  Continue taking that as recommended      Relevant Orders   CBC   Serum calcium elevated    Pending PTH, calcium levels.  Patient's vitamin D was checked in the past and was within normal limits      Relevant Orders   PTH, intact and calcium   Other Visit Diagnoses     Screening mammogram for breast cancer       Relevant Orders   MM 3D SCREENING MAMMOGRAM BILATERAL BREAST       Return in about 4 months (around 09/20/2022) for DM recheck.    Romilda Garret, NP

## 2022-05-22 LAB — PTH, INTACT AND CALCIUM
Calcium: 11.1 mg/dL — ABNORMAL HIGH (ref 8.6–10.4)
PTH: 165 pg/mL — ABNORMAL HIGH (ref 16–77)

## 2022-05-23 ENCOUNTER — Telehealth: Payer: Self-pay | Admitting: Nurse Practitioner

## 2022-05-23 DIAGNOSIS — E213 Hyperparathyroidism, unspecified: Secondary | ICD-10-CM

## 2022-05-23 NOTE — Telephone Encounter (Signed)
-----   Message from Pat Kocher, [place] sent at 05/23/2022 11:11 AM EST ----- Called patient and reviewed all information. Patient verbalized understanding.  Patients prefers a [place] location.  Will call if any further questions.

## 2022-05-26 ENCOUNTER — Encounter: Payer: Self-pay | Admitting: *Deleted

## 2022-06-25 ENCOUNTER — Other Ambulatory Visit: Payer: Self-pay

## 2022-07-02 ENCOUNTER — Ambulatory Visit: Payer: Medicaid Other | Admitting: Gastroenterology

## 2022-07-03 ENCOUNTER — Ambulatory Visit
Admission: RE | Admit: 2022-07-03 | Discharge: 2022-07-03 | Disposition: A | Discharge status: Home / Self Care | Payer: 59 | Source: Ambulatory Visit | Attending: Nurse Practitioner | Admitting: Nurse Practitioner

## 2022-07-03 DIAGNOSIS — Z1231 Encounter for screening mammogram for malignant neoplasm of breast: Secondary | ICD-10-CM

## 2022-07-15 ENCOUNTER — Ambulatory Visit (INDEPENDENT_AMBULATORY_CARE_PROVIDER_SITE_OTHER): Payer: 59 | Admitting: Nurse Practitioner

## 2022-07-15 ENCOUNTER — Encounter: Payer: Self-pay | Admitting: Nurse Practitioner

## 2022-07-15 VITALS — BP 118/68 | HR 85 | Temp 98.9°F | Resp 16 | Ht 66.0 in | Wt 185.0 lb

## 2022-07-15 DIAGNOSIS — J339 Nasal polyp, unspecified: Secondary | ICD-10-CM

## 2022-07-15 DIAGNOSIS — R040 Epistaxis: Secondary | ICD-10-CM | POA: Diagnosis not present

## 2022-07-15 DIAGNOSIS — I10 Essential (primary) hypertension: Secondary | ICD-10-CM

## 2022-07-15 DIAGNOSIS — I781 Nevus, non-neoplastic: Secondary | ICD-10-CM | POA: Insufficient documentation

## 2022-07-15 DIAGNOSIS — R600 Localized edema: Secondary | ICD-10-CM | POA: Diagnosis not present

## 2022-07-15 LAB — CBC
HCT: 41.2 % (ref 36.0–46.0)
Hemoglobin: 14 g/dL (ref 12.0–15.0)
MCHC: 33.9 g/dL (ref 30.0–36.0)
MCV: 98.6 fl (ref 78.0–100.0)
Platelets: 178 10*3/uL (ref 150.0–400.0)
RBC: 4.18 Mil/uL (ref 3.87–5.11)
RDW: 14.6 % (ref 11.5–15.5)
WBC: 9.1 10*3/uL (ref 4.0–10.5)

## 2022-07-15 LAB — COMPREHENSIVE METABOLIC PANEL
ALT: 34 U/L (ref 0–35)
AST: 49 U/L — ABNORMAL HIGH (ref 0–37)
Albumin: 4.1 g/dL (ref 3.5–5.2)
Alkaline Phosphatase: 312 U/L — ABNORMAL HIGH (ref 39–117)
BUN: 14 mg/dL (ref 6–23)
CO2: 28 mEq/L (ref 19–32)
Calcium: 11.4 mg/dL — ABNORMAL HIGH (ref 8.4–10.5)
Chloride: 102 mEq/L (ref 96–112)
Creatinine, Ser: 0.95 mg/dL (ref 0.40–1.20)
GFR: 61.86 mL/min (ref >60.00)
Glucose, Bld: 156 mg/dL — ABNORMAL HIGH (ref 70–99)
Potassium: 4.4 mEq/L (ref 3.5–5.1)
Sodium: 137 mEq/L (ref 135–145)
Total Bilirubin: 0.7 mg/dL (ref 0.2–1.2)
Total Protein: 8.1 g/dL (ref 6.0–8.3)

## 2022-07-15 LAB — PROTIME-INR
INR: 1.2 ratio — ABNORMAL HIGH (ref 0.8–1.0)
Prothrombin Time: 12.2 s (ref 9.6–13.1)

## 2022-07-15 LAB — TSH: TSH: 0.82 u[IU]/mL (ref 0.35–5.50)

## 2022-07-15 LAB — BRAIN NATRIURETIC PEPTIDE: Pro B Natriuretic peptide (BNP): 58 pg/mL (ref 0.0–100.0)

## 2022-07-15 NOTE — Patient Instructions (Signed)
Nice to see you today Keep your feet elevated when you can I will be in touch with the labs once I have reviewed them

## 2022-07-15 NOTE — Assessment & Plan Note (Signed)
Bilateral lower extremity edema.  Patient has no history of DVT or PEs per her report no exogenous hormones no recent travel 4+ hours or more and no recent surgery.  Swelling will go down with elevation pending labs likely just dependent edema due to vascular insufficiency

## 2022-07-15 NOTE — Assessment & Plan Note (Signed)
Polyp versus turbinate on exam incidental finding

## 2022-07-15 NOTE — Progress Notes (Signed)
Acute Office Visit  Subjective:     Patient ID: Megan [name], female    DOB: [birth year], 67 y.o.   MRN: [id number]  Chief Complaint  Patient presents with   Joint Swelling    X 1.5 week   Epistaxis     Patient is in today for joint swelling with a history of HTN, chronic bronchitis, GERD, cirrhoisis of liver, DM2, arthritis   Ankle swelling: states that she has been taking black seed oil for yrease. States that she started taking a gummy that had black seed oil, tumeric, and seas moss. States that she has been off the gummies for 3 weeks. States that it has been approx 1.5 weeks. States that she will go to bed and it will get better after she goes to bed and will get better.   Epistaxis: states that it has been bleeding since beginning of rmarch. States that she would blow her nose and states that she noticed some blood. States 2 days ago she was sitting there and her nose started bleeding. States that last night she had another one and it was easier to stop.    Review of Systems  Constitutional:  Negative for chills and fever.  HENT:  Positive for nosebleeds.   Respiratory:  Negative for shortness of breath.   Cardiovascular:  Negative for chest pain.  Neurological:  Negative for headaches.        Objective:    BP 118/68   Pulse 85   Temp 98.9 F (37.2 C)   Resp 16   Ht 5\' 6"  (1.676 m)   Wt 185 lb (83.9 kg)   SpO2 98%   BMI 29.86 kg/m    Physical Exam HENT:     Nose:     Comments: Nasal polyp to left nare Cardiovascular:     Rate and Rhythm: Normal rate.     Heart sounds: Normal heart sounds.  Pulmonary:     Effort: Pulmonary effort is normal.     Breath sounds: Normal breath sounds.  Musculoskeletal:     Right lower leg: Edema present.     Left lower leg: Edema present.     Comments: 35.5 right calf 35cm left calf  Bilateral calves nontender nonerythematous on palpation.  Negative Denna Haggard' sign     No results found for any visits on  07/15/22.      Assessment & Plan:   Problem List Items Addressed This Visit       Cardiovascular and Mediastinum   Essential hypertension    Patient has history of hypertension currently on ACE inhibitor calcium channel blocker.      Spider veins    To bilateral lower extremities of ankles and feet.        Respiratory   Nasal polyp    Polyp versus turbinate on exam incidental finding        Other   Epistaxis    Patient does have a left-sided nasal turbinate/polyp.  No clear indication as to why she is having epistaxis.  Can use nasal saline to keep passages moist  Given patient does have history of liver disease and spontaneous nosebleeds will check bleed times along with a CBC      Relevant Orders   Protime-INR   Localized edema - Primary    Bilateral lower extremity edema.  Patient has no history of DVT or PEs per her report no exogenous hormones no recent travel 4+ hours or more and no recent surgery.  Swelling will go down with elevation pending labs likely just dependent edema due to vascular insufficiency      Relevant Orders   CBC   TSH   Comprehensive metabolic panel   Brain natriuretic peptide    No orders of the defined types were placed in this encounter.   Return if symptoms worsen or fail to improve, for As scheduled .  Audria Nine, NP

## 2022-07-15 NOTE — Assessment & Plan Note (Signed)
Patient has history of hypertension currently on ACE inhibitor calcium channel blocker.

## 2022-07-15 NOTE — Assessment & Plan Note (Signed)
To bilateral lower extremities of ankles and feet.

## 2022-07-15 NOTE — Assessment & Plan Note (Addendum)
Patient does have a left-sided nasal turbinate/polyp.  No clear indication as to why she is having epistaxis.  Can use nasal saline to keep passages moist  Given patient does have history of liver disease and spontaneous nosebleeds will check bleed times along with a CBC

## 2022-07-24 ENCOUNTER — Ambulatory Visit (INDEPENDENT_AMBULATORY_CARE_PROVIDER_SITE_OTHER): Payer: 59 | Admitting: Podiatry

## 2022-07-24 ENCOUNTER — Encounter: Payer: Self-pay | Admitting: Podiatry

## 2022-07-24 VITALS — BP 160/87

## 2022-07-24 DIAGNOSIS — M79609 Pain in unspecified limb: Secondary | ICD-10-CM | POA: Diagnosis not present

## 2022-07-24 DIAGNOSIS — E119 Type 2 diabetes mellitus without complications: Secondary | ICD-10-CM | POA: Diagnosis not present

## 2022-07-24 DIAGNOSIS — B351 Tinea unguium: Secondary | ICD-10-CM

## 2022-07-24 DIAGNOSIS — E1151 Type 2 diabetes mellitus with diabetic peripheral angiopathy without gangrene: Secondary | ICD-10-CM

## 2022-07-24 DIAGNOSIS — Q828 Other specified congenital malformations of skin: Secondary | ICD-10-CM | POA: Diagnosis not present

## 2022-07-28 NOTE — Progress Notes (Signed)
ANNUAL DIABETIC FOOT EXAM  Subjective: Megan [name] presents today annual diabetic foot exam.  Chief Complaint  Patient presents with   Nail Problem    DFC,PCP-Cable, Genene Churn, NP  [id number] A1C-7.0,B/S-not taken today   Patient confirms h/o diabetes.  Patient denies any h/o foot wounds.  Risk factors: diabetes, HTN, hyperlipidemia, current tobacco user.  Eden Emms, NP is patient's PCP.  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   Hypercholesteremia    Hyperparathyroidism (HCC)    Hypertension    Patient Active Problem List   Diagnosis Date Noted   Nasal polyp 07/15/2022   Epistaxis 07/15/2022   Spider veins 07/15/2022   Localized edema 07/15/2022   Serum calcium elevated 05/21/2022   Pre-operative clearance 02/25/2022   Aortic arch atherosclerosis (HCC) 02/13/2022   Elevated LDL cholesterol level 02/13/2022   Alcohol use 02/13/2022   Low sodium levels 02/13/2022   Grieving 02/13/2022   Mass of left side of neck 02/13/2022   Atypical chest pain 08/08/2021   Porokeratosis 07/22/2021   Corns and callus 04/10/2021   Cirrhosis of liver without ascites (HCC)    Iron deficiency anemia    Tobacco use 12/05/2020   Fatty liver disease, nonalcoholic 12/05/2020   Arthritis of left hip 09/20/2020   Assistance needed with transportation 09/20/2020   Hepatomegaly 05/17/2020   Hyperparathyroidism (HCC) 01/19/2018   Mixed hyperlipidemia 01/19/2018   Class 1 obesity with body mass index (BMI) of 33.0 to 33.9 in adult 05/27/2017   Essential hypertension 05/27/2017   Gastroesophageal reflux disease without esophagitis 05/27/2017   Mucopurulent chronic bronchitis (HCC) 05/27/2017    Type 2 diabetes mellitus without complication, without long-term current use of insulin (HCC) 05/27/2017   Past Surgical History:  Procedure Laterality Date   COLONOSCOPY WITH PROPOFOL N/A 06/25/2020   Procedure: COLONOSCOPY WITH PROPOFOL;  Surgeon: Wyline Mood, MD;  Location: [facility] ENDOSCOPY;  Service: Endoscopy;  Laterality: N/A;   ESOPHAGOGASTRODUODENOSCOPY (EGD) WITH PROPOFOL N/A 03/27/2021   Procedure: ESOPHAGOGASTRODUODENOSCOPY (EGD) WITH PROPOFOL;  Surgeon: Toney Reil, MD;  Location: [facility] ENDOSCOPY;  Service: Gastroenterology;  Laterality: N/A;   TOTAL HIP ARTHROPLASTY Left 01/11/2021   Procedure: TOTAL HIP ARTHROPLASTY ANTERIOR APPROACH;  Surgeon: Lyndle Herrlich, MD;  Location: [facility] ORS;  Service: Orthopedics;  Laterality: Left;   TOTAL HIP ARTHROPLASTY Right 05/27/2021   Procedure: TOTAL HIP ARTHROPLASTY ANTERIOR APPROACH;  Surgeon: Lyndle Herrlich, MD;  Location: [facility] ORS;  Service: Orthopedics;  Laterality: Right;   TUBAL LIGATION     Current Outpatient Medications on File Prior to Visit  Medication Sig Dispense Refill   acetaminophen (TYLENOL) 500 MG tablet Take 1,000 mg by mouth 2 (two) times daily.     albuterol (VENTOLIN HFA) 108 (90 Base) MCG/ACT inhaler Inhale 1-2 puffs into the lungs every 6 (six) hours as needed for wheezing or shortness of breath. 1 each 0   aspirin 81 MG chewable tablet Chew 1 tablet (81 mg total) by mouth daily.     atorvastatin (LIPITOR) 40 MG tablet Take 1 tablet (40 mg total) by mouth daily. 90 tablet 1   Biotin w/ Vitamins C & E (HAIR/SKIN/NAILS PO)  Take 1 capsule by mouth in the morning.     BLACK CURRANT SEED OIL PO Take 1 capsule by mouth in the morning.     blood glucose meter kit and supplies KIT Dispense based on patient and insurance preference. Use up to four times daily as directed. 1 each 0   cholecalciferol (VITAMIN D3) 25 MCG (1000 UNIT) tablet Take 1,000 Units by mouth daily.     dapagliflozin propanediol (FARXIGA) 10 MG TABS  tablet Take 1 tablet (10 mg total) by mouth daily before breakfast. 90 tablet 1   docusate sodium (COLACE) 100 MG capsule Take 100 mg by mouth daily.     ferrous gluconate (FERGON) 240 (27 FE) MG tablet Take 240 mg by mouth daily.     glucose blood test strip Use as instructed, to test blood sugar twice daily. 100 each 2   Lancets Misc. (ACCU-CHEK FASTCLIX LANCET) KIT Use as directed to test blood sugar twice daily. 1 kit 12   lisinopril (ZESTRIL) 20 MG tablet Take 1 tablet (20 mg total) by mouth every morning. (Patient taking differently: Take 20 mg by mouth daily.) 90 tablet 1   meloxicam (MOBIC) 15 MG tablet Take 15 mg by mouth daily.     Omega-3 Fatty Acids (FISH OIL PO) Take 1 capsule by mouth daily.     omeprazole (PRILOSEC) 40 MG capsule Take 1 capsule (40 mg total) by mouth every morning. 90 capsule 1   ondansetron (ZOFRAN-ODT) 8 MG disintegrating tablet Take 8 mg by mouth every 8 (eight) hours as needed.     sitaGLIPtin (JANUVIA) 100 MG tablet Take 1 tablet (100 mg total) by mouth daily. 90 tablet 1   No current facility-administered medications on file prior to visit.    Allergies  Allergen Reactions   Peanut Butter Flavor Anaphylaxis   Peanut-Containing Drug Products Anaphylaxis   Social History   Occupational History   Occupation: retired  Tobacco Use   Smoking status: Every Day    Packs/day: 0.33    Years: 50.00    Additional pack years: 0.00    Total pack years: 16.50    Types: Cigarettes   Smokeless tobacco: Never  Vaping Use   Vaping Use: Never used  Substance and Sexual Activity   Alcohol use: Yes    Alcohol/week: 14.0 standard drinks of alcohol    Types: 14 Shots of liquor per week    Comment: occasionally. States twice a week   Drug use: Never   Sexual activity: Not Currently   Family History  Adopted: Yes  Family history unknown: Yes   Immunization History  Administered Date(s) Administered   Moderna Sars-Covid-2 Vaccination 07/11/2019, 08/11/2019      Review of Systems: Negative except as noted in the HPI.   Objective: Vitals:   07/24/22 0911  BP: (!) 160/87   Megan [name] is a pleasant 67 y.o. female in NAD. AAO X 3.  Vascular Examination: CFT <3 seconds b/l. DP/PT pulses faintly palpable b/l. Skin temperature gradient warm to warm b/l. No pain with calf compression. No ischemia or gangrene. No cyanosis or clubbing noted b/l. Pedal hair sparse.   Neurological Examination: Sensation grossly intact b/l with 10 gram monofilament. Vibratory sensation intact b/l.   Dermatological Examination: Pedal skin warm and supple b/l.   No open wounds. No interdigital macerations.  Toenails 1-5 b/l thick, discolored, elongated with subungual debris and pain on dorsal palpation.    Porokeratotic lesion(s) submet head 1 b/l, submet head 3 left  foot, and submet head 5 right foot. No erythema, no edema, no drainage, no fluctuance.  Musculoskeletal Examination: Normal muscle strength 5/5 to all lower extremity muscle groups bilaterally. No pain, crepitus or joint limitation noted with ROM b/l LE. No gross bony pedal deformities b/l. Patient ambulates independently without assistive aids.  Radiographs: None  Last A1c:      Latest Ref Rng & Units 05/21/2022   11:35 AM 02/13/2022   11:18 AM 08/08/2021    9:40 AM  Hemoglobin A1C  Hemoglobin-A1c 4.0 - 5.6 % 6.9  6.2  6.2    ADA Risk Categorization: Low Risk :  Patient has all of the following: Intact protective sensation No prior foot ulcer  No severe deformity Pedal pulses present  Assessment: 1. Pain due to onychomycosis of nail   2. Porokeratosis   3. Diabetes mellitus with peripheral circulatory disorder, controlled (HCC)   4. Encounter for diabetic foot exam (HCC)     Plan: -Consent given for treatment as described below: -Examined patient. -Patient is a current tobacco user and not interested in cessation at this time. -Diabetic foot examination performed  today. -Patient to continue soft, supportive shoe gear daily. -Toenails 1-5 b/l were debrided in length and girth with sterile nail nippers and dremel without iatrogenic bleeding.  -Porokeratotic lesion(s) submet head 1 b/l, submet head 3 left foot, and submet head 5 right foot pared and enucleated with sterile currette without incident. Total number of lesions debrided=4. -Patient/POA to call should there be question/concern in the interim. Return in about 9 weeks (around 09/25/2022).  Freddie Breech, DPM

## 2022-09-19 IMAGING — XA DG HIP (WITH OR WITHOUT PELVIS) 1V*R*
1 series · 3 of 3 positions shown · non-contrast
Comparison: None.

CLINICAL DATA: Operative fluoroscopy right anterior approach total
hip arthroplasty.

EXAM:
DG HIP (WITH OR WITHOUT PELVIS) 1V RIGHT

[Series 1: dg x-ray · 0.20mm/px · 3 of 3 slices shown]
[im 1/3]
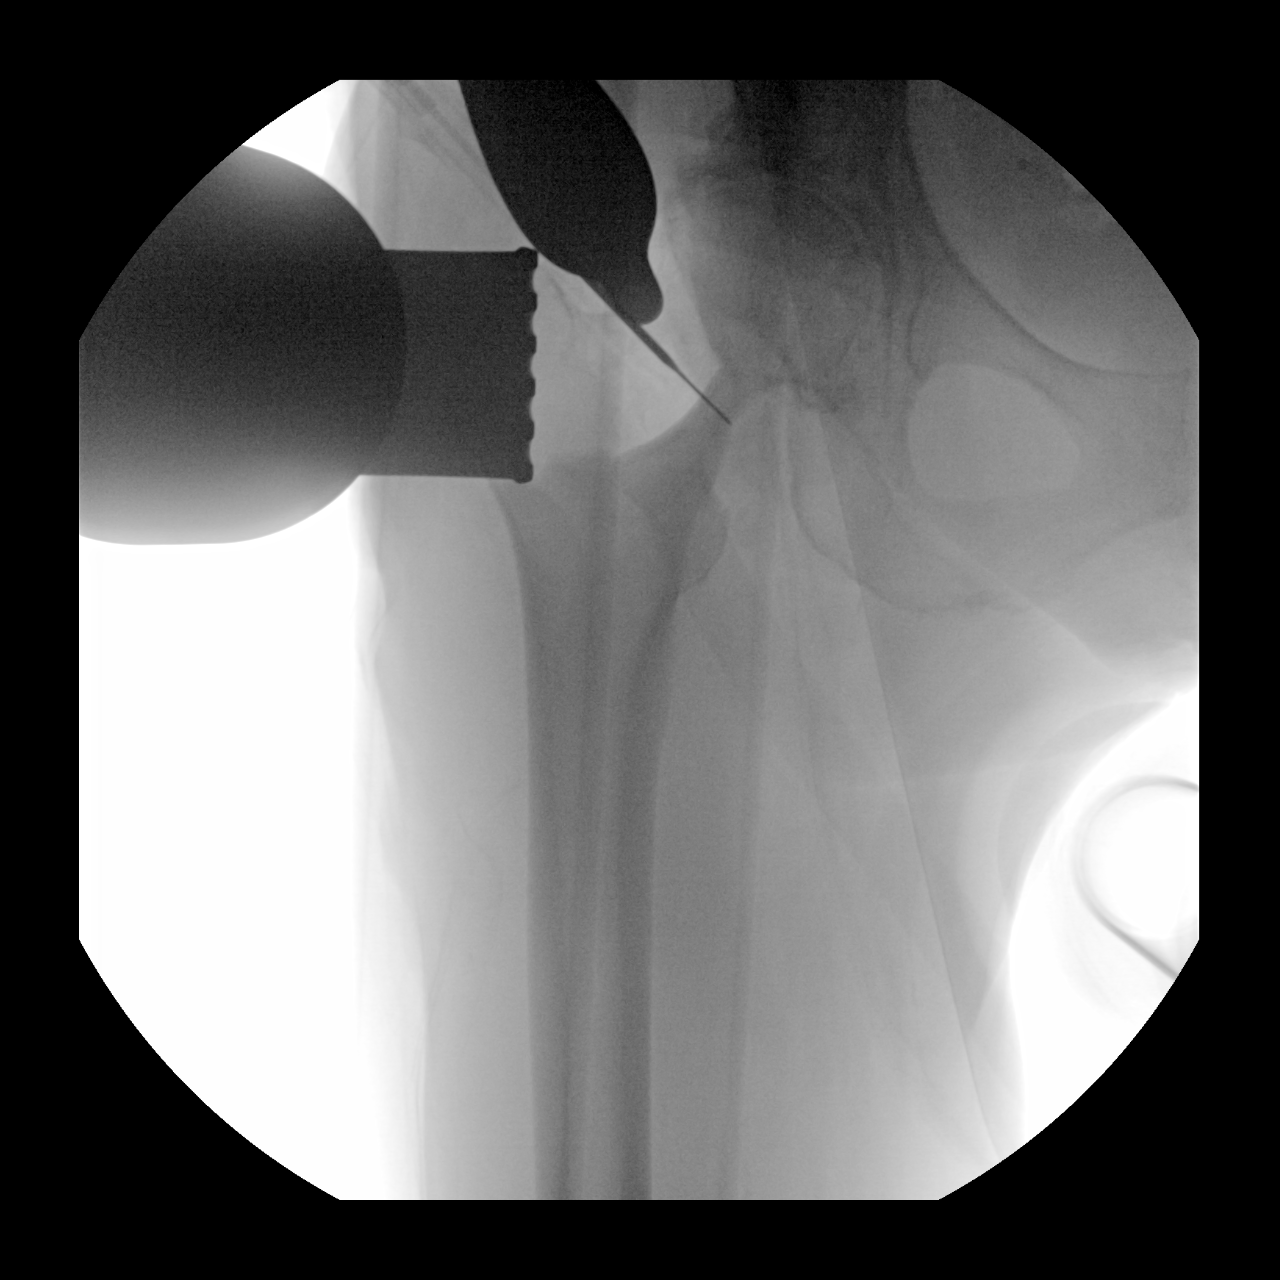
[im 2/3]
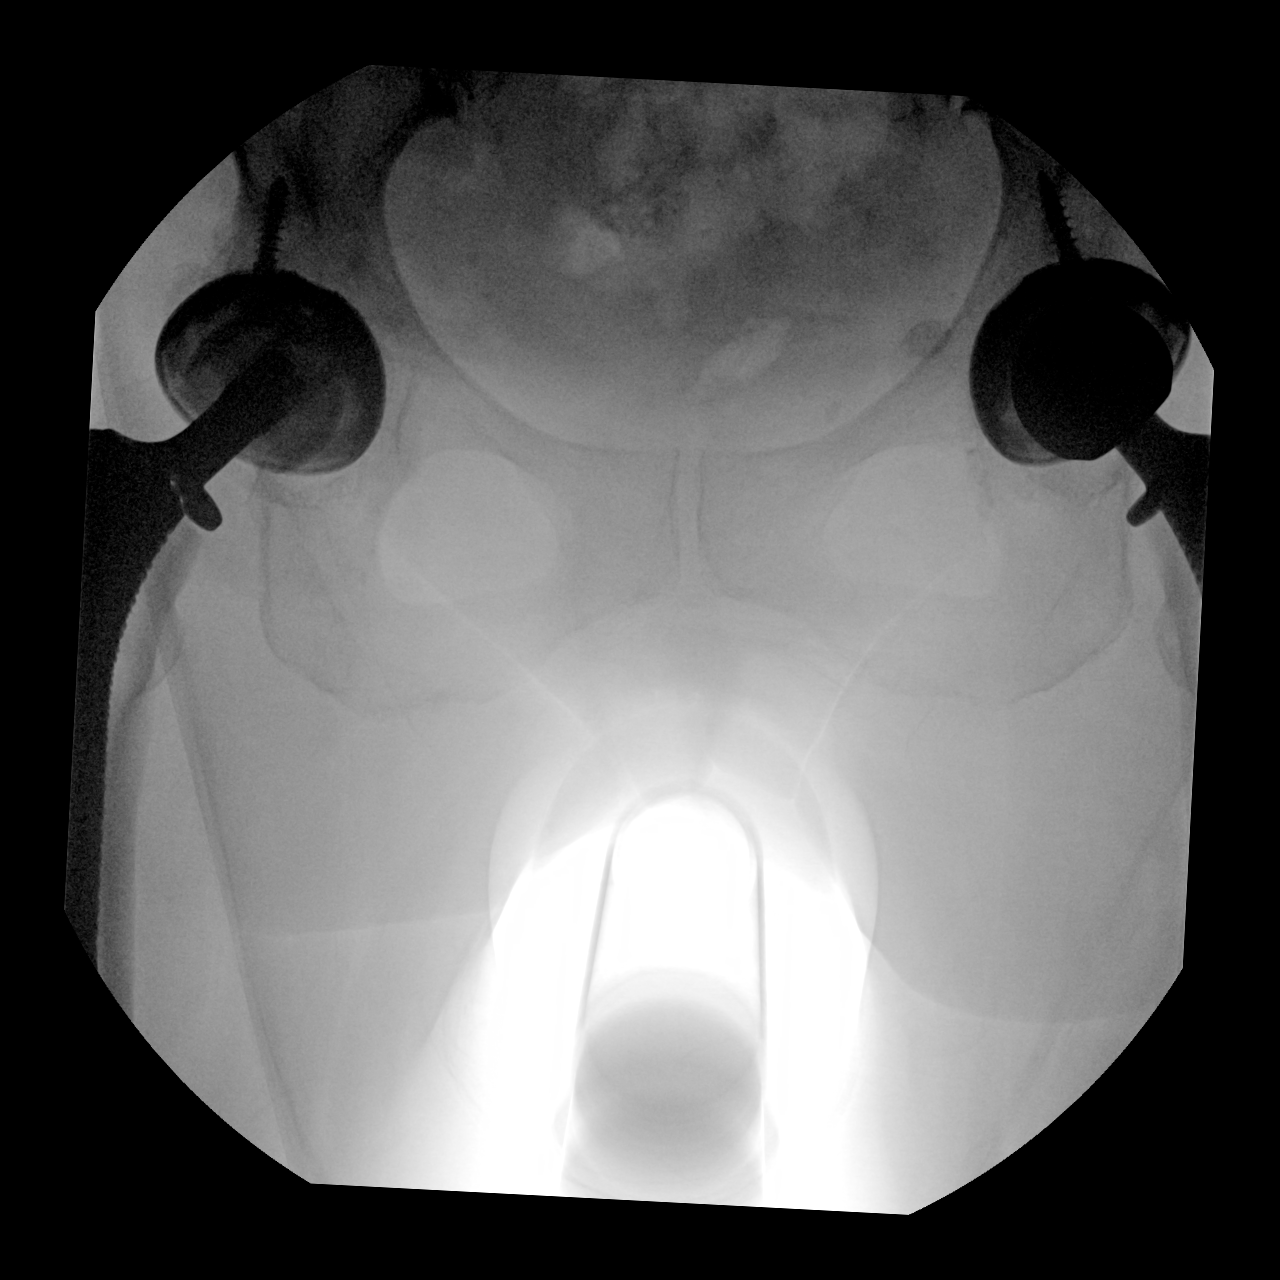
[im 3/3]
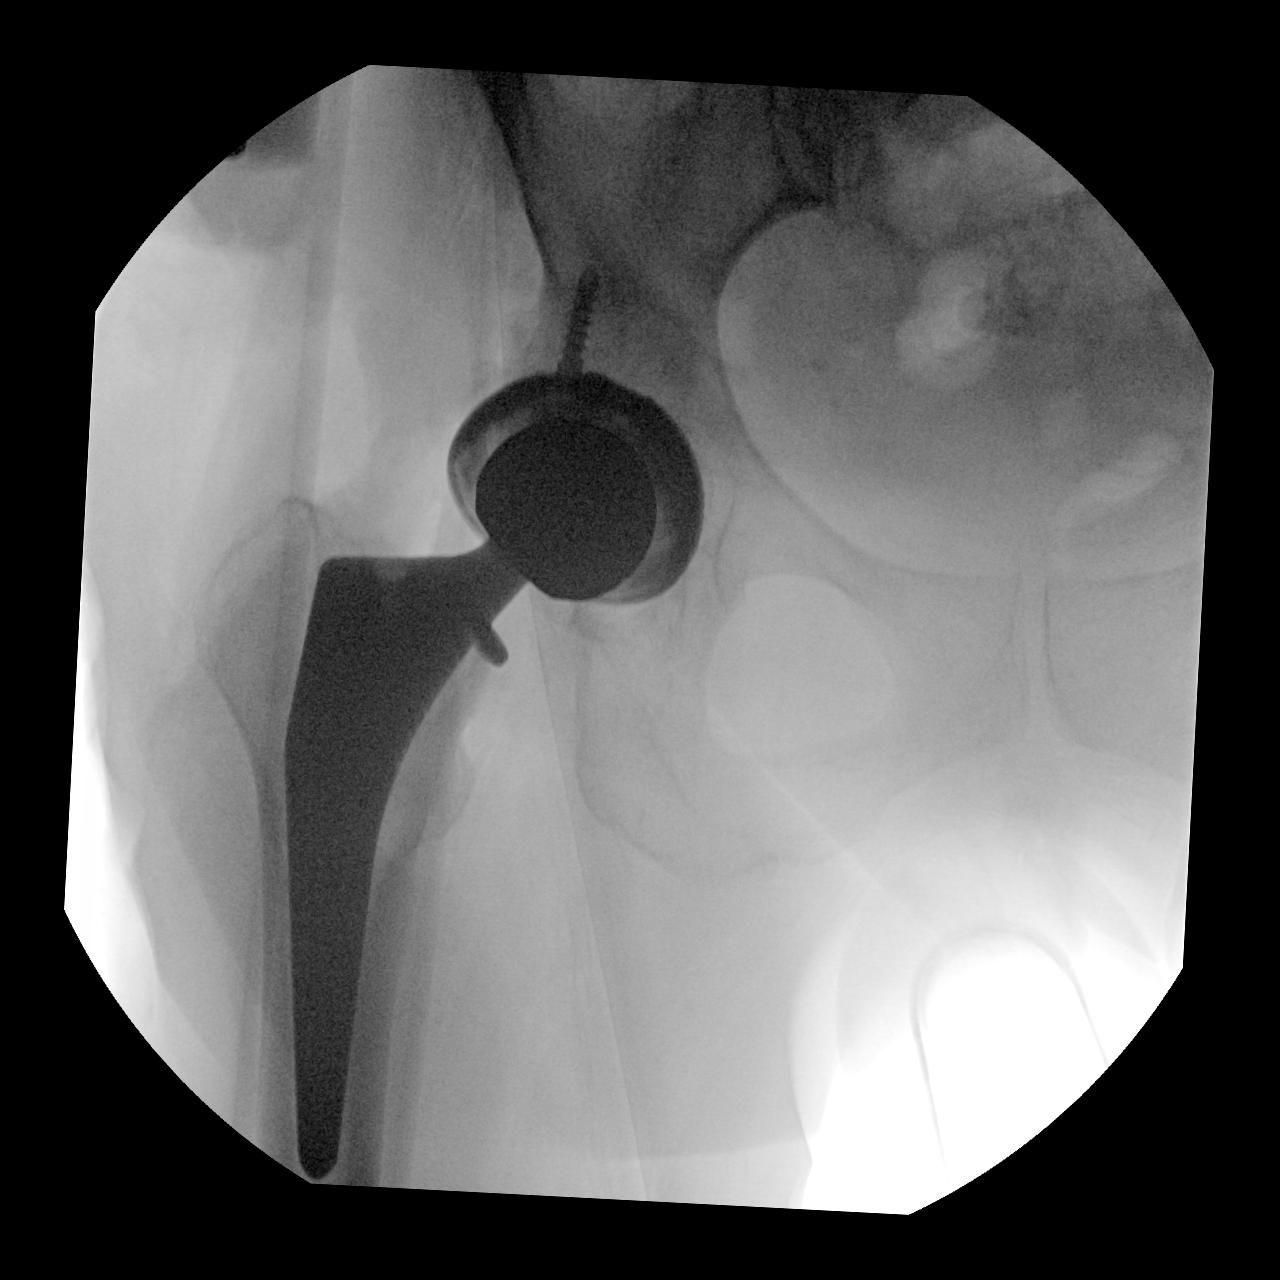

[3 of 3 positions shown; findings below may reference images not displayed]

FINDINGS: Images were performed intraoperatively without the presence of a
radiologist.

Total fluoroscopic images: 3

Total fluoro time: 8 seconds

The patient appears to be undergoing total right hip arthroplasty.
Prior total left hip arthroplasty is noted.

Please see intraoperative findings for further detail.
IMPRESSION: Fluoroscopy for total right hip arthroplasty.

## 2022-09-25 ENCOUNTER — Other Ambulatory Visit: Payer: Self-pay

## 2022-09-25 DIAGNOSIS — E119 Type 2 diabetes mellitus without complications: Secondary | ICD-10-CM

## 2022-09-25 DIAGNOSIS — K219 Gastro-esophageal reflux disease without esophagitis: Secondary | ICD-10-CM

## 2022-09-25 NOTE — Telephone Encounter (Signed)
Januvia  100MG    LAST APPOINTMENT DATE: 07/15/22  NEXT APPOINTMENT DATE: 12/17/22  LAST REFILL: 02/14/23  QTY: #90 1RF   Omeprazole 40mg   LAST APPOINTMENT DATE: 07/15/22  NEXT APPOINTMENT DATE: 12/17/22  LAST REFILL: 02/14/23  QTY: #90 1RF

## 2022-09-25 NOTE — Telephone Encounter (Signed)
LAST APPOINTMENT DATE: 07/15/2022   NEXT APPOINTMENT DATE: 11/04/2022   FARXIGA 10 MG LAST REFILL: 02/13/22  QTY: #90 1RF

## 2022-09-26 MED ORDER — OMEPRAZOLE 40 MG PO CPDR: 40.0000 mg | DELAYED_RELEASE_CAPSULE | ORAL | 1 refills | Status: DC

## 2022-09-26 MED ORDER — SITAGLIPTIN PHOSPHATE 100 MG PO TABS: 100.0000 mg | ORAL_TABLET | Freq: Every day | ORAL | 1 refills | Status: DC

## 2022-09-26 MED ORDER — DAPAGLIFLOZIN PROPANEDIOL 10 MG PO TABS: 10.0000 mg | ORAL_TABLET | Freq: Every day | ORAL | 1 refills | Status: DC

## 2022-09-29 ENCOUNTER — Ambulatory Visit: Payer: 59 | Admitting: Nurse Practitioner

## 2022-10-15 ENCOUNTER — Encounter (INDEPENDENT_AMBULATORY_CARE_PROVIDER_SITE_OTHER): Payer: Self-pay

## 2022-10-17 ENCOUNTER — Telehealth: Payer: Medicare Other

## 2022-10-23 ENCOUNTER — Ambulatory Visit: Payer: 59 | Admitting: Podiatry

## 2022-11-03 ENCOUNTER — Ambulatory Visit (INDEPENDENT_AMBULATORY_CARE_PROVIDER_SITE_OTHER): Payer: 59 | Admitting: Podiatry

## 2022-11-03 ENCOUNTER — Encounter: Payer: Self-pay | Admitting: Podiatry

## 2022-11-03 DIAGNOSIS — Q828 Other specified congenital malformations of skin: Secondary | ICD-10-CM | POA: Diagnosis not present

## 2022-11-03 DIAGNOSIS — E1151 Type 2 diabetes mellitus with diabetic peripheral angiopathy without gangrene: Secondary | ICD-10-CM

## 2022-11-03 DIAGNOSIS — B351 Tinea unguium: Secondary | ICD-10-CM

## 2022-11-03 NOTE — Progress Notes (Signed)
  Subjective:  Patient ID: BAO MOOREFIELD, female    DOB: Aug 25, 1955,  MRN: [id number]  Megan [name] presents to clinic today for at risk foot care. Pt has h/o NIDDM with PAD and painful porokeratotic lesions of both feet. Pain prevent(s) comfortable ambulation. Aggravating factor is weightbearing with and without shoegear.  Chief Complaint  Patient presents with    DFC,Referring Provider Eden Emms, NP,lov:04/24,A1C:6.9   Callouses    B/L bottoms of feet are sore   New problem(s): None.   PCP is Eden Emms, NP.  Allergies  Allergen Reactions   Peanut Butter Flavor Anaphylaxis   Peanut-Containing Drug Products Anaphylaxis    Review of Systems: Negative except as noted in the HPI.  Objective: No changes noted in today's physical examination. There were no vitals filed for this visit. Megan [name] is a pleasant 67 y.o. female WD, WN in NAD. AAO x 3.  Vascular Examination: CFT <3 seconds b/l. DP/PT pulses faintly palpable b/l. Skin temperature gradient warm to warm b/l. No pain with calf compression. No ischemia or gangrene. No cyanosis or clubbing noted b/l. Pedal hair sparse.   Neurological Examination: Sensation grossly intact b/l with 10 gram monofilament. Vibratory sensation intact b/l.   Dermatological Examination: Pedal skin warm and supple b/l.   No open wounds. No interdigital macerations.  Toenails 1-5 b/l well maintained with adequate length. No erythema, no edema, no drainage, no fluctuance.   Porokeratotic lesion(s) submet head 1 b/l, submet head 3 left foot, and submet head 5 right foot. No erythema, no edema, no drainage, no fluctuance.  Musculoskeletal Examination: Normal muscle strength 5/5 to all lower extremity muscle groups bilaterally. No pain, crepitus or joint limitation noted with ROM b/l LE. No gross bony pedal deformities b/l. Patient ambulates independently without assistive aids.  Radiographs: None  Last HgA1c:       Latest Ref Rng & Units 05/21/2022   11:35 AM 02/13/2022   11:18 AM  Hemoglobin A1C  Hemoglobin-A1c 4.0 - 5.6 % 6.9  6.2    Assessment/Plan: 1. Porokeratosis   2. Diabetes mellitus with peripheral circulatory disorder, controlled (HCC)     -Patient was evaluated and treated. All patient's and/or POA's questions/concerns answered on today's visit. -Patient to continue soft, supportive shoe gear daily. -Porokeratotic lesion(s) submet head 1 b/l, submet head 3 left foot, and submet head 5 right foot pared and enucleated with sterile currette without incident. Total number of lesions debrided=4. -Patient/POA to call should there be question/concern in the interim.   Return in about 9 weeks (around 01/05/2023).  Freddie Breech, DPM

## 2022-11-04 ENCOUNTER — Ambulatory Visit (INDEPENDENT_AMBULATORY_CARE_PROVIDER_SITE_OTHER): Payer: 59

## 2022-11-04 VITALS — Ht 66.0 in | Wt 180.0 lb

## 2022-11-04 DIAGNOSIS — Z0000 Encounter for general adult medical examination without abnormal findings: Secondary | ICD-10-CM

## 2022-11-04 NOTE — Progress Notes (Signed)
Subjective:   Megan [name] is a 67 y.o. female who presents for Medicare Annual (Subsequent) preventive examination.  Visit Complete: Virtual  I connected with  Megan [name] on 11/04/22 by a audio enabled telemedicine application and verified that I am speaking with the correct person using two identifiers.  Patient Location: Home  Provider Location: Office/Clinic  I discussed the limitations of evaluation and management by telemedicine. The patient expressed understanding and agreed to proceed.  Patient Medicare AWV questionnaire was completed by the patient on 10/31/22; I have confirmed that all information answered by patient is correct and no changes since this date.  Vital Signs: Because this visit was a virtual/telehealth visit, some criteria may be missing or patient reported. Any vitals not documented were not able to be obtained and vitals that have been documented are patient reported.    Review of Systems      Cardiac Risk Factors include: advanced age (>61men, >40 women);hypertension;diabetes mellitus;dyslipidemia;obesity (BMI >30kg/m2);sedentary lifestyle;smoking/ tobacco exposure     Objective:    Today's Vitals   10/31/22 1458 11/04/22 0911  Weight:  180 lb (81.6 kg)  Height:  5\' 6"  (1.676 m)  PainSc: 3     Body mass index is 29.05 kg/m.     11/04/2022    9:25 AM 05/27/2021   11:59 AM 05/27/2021    6:54 AM 05/15/2021    9:39 AM 03/27/2021    8:29 AM 01/11/2021    4:18 PM 01/07/2021   10:46 AM  Advanced Directives  Does Patient Have a Medical Advance Directive? No No No No No No No  Would patient like information on creating a medical advance directive? No - Patient declined No - Patient declined No - Patient declined   No - Patient declined No - Patient declined    Current Medications (verified) Outpatient Encounter Medications as of 11/04/2022  Medication Sig   acetaminophen (TYLENOL) 500 MG tablet Take 1,000 mg by mouth 2 (two) times daily.    albuterol (VENTOLIN HFA) 108 (90 Base) MCG/ACT inhaler Inhale 1-2 puffs into the lungs every 6 (six) hours as needed for wheezing or shortness of breath.   aspirin 81 MG chewable tablet Chew 1 tablet (81 mg total) by mouth daily.   atorvastatin (LIPITOR) 40 MG tablet Take 1 tablet (40 mg total) by mouth daily.   Biotin w/ Vitamins C & E (HAIR/SKIN/NAILS PO) Take 1 capsule by mouth in the morning.   BLACK CURRANT SEED OIL PO Take 1 capsule by mouth in the morning.   cholecalciferol (VITAMIN D3) 25 MCG (1000 UNIT) tablet Take 1,000 Units by mouth daily.   dapagliflozin propanediol (FARXIGA) 10 MG TABS tablet Take 1 tablet (10 mg total) by mouth daily before breakfast.   docusate sodium (COLACE) 100 MG capsule Take 100 mg by mouth daily.   ferrous gluconate (FERGON) 240 (27 FE) MG tablet Take 240 mg by mouth daily.   lisinopril (ZESTRIL) 20 MG tablet Take 1 tablet (20 mg total) by mouth every morning. (Patient taking differently: Take 20 mg by mouth daily.)   Omega-3 Fatty Acids (FISH OIL PO) Take 1 capsule by mouth daily.   omeprazole (PRILOSEC) 40 MG capsule Take 1 capsule (40 mg total) by mouth every morning.   sitaGLIPtin (JANUVIA) 100 MG tablet Take 1 tablet (100 mg total) by mouth daily.   blood glucose meter kit and supplies KIT Dispense based on patient and insurance preference. Use up to four times daily as directed. (Patient not  taking: Reported on 11/03/2022)   glucose blood test strip Use as instructed, to test blood sugar twice daily. (Patient not taking: Reported on 11/04/2022)   Lancets Misc. (ACCU-CHEK FASTCLIX LANCET) KIT Use as directed to test blood sugar twice daily. (Patient not taking: Reported on 11/03/2022)   meloxicam (MOBIC) 15 MG tablet Take 15 mg by mouth daily. (Patient not taking: Reported on 11/03/2022)   ondansetron (ZOFRAN-ODT) 8 MG disintegrating tablet Take 8 mg by mouth every 8 (eight) hours as needed. (Patient not taking: Reported on 11/03/2022)   No  facility-administered encounter medications on file as of 11/04/2022.    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   Hypercholesteremia    Hyperparathyroidism (HCC)    Hypertension    Past Surgical History:  Procedure Laterality Date   COLONOSCOPY WITH PROPOFOL N/A 06/25/2020   Procedure: COLONOSCOPY WITH PROPOFOL;  Surgeon: Wyline Mood, MD;  Location: [facility] ENDOSCOPY;  Service: Endoscopy;  Laterality: N/A;   ESOPHAGOGASTRODUODENOSCOPY (EGD) WITH PROPOFOL N/A 03/27/2021   Procedure: ESOPHAGOGASTRODUODENOSCOPY (EGD) WITH PROPOFOL;  Surgeon: Toney Reil, MD;  Location[facility] ENDOSCOPY;  Service: Gastroenterology;  Laterality: N/A;   TOTAL HIP ARTHROPLASTY Left 01/11/2021   Procedure: TOTAL HIP ARTHROPLASTY ANTERIOR APPROACH;  Surgeon: Lyndle Herrlich, MD;  Location: [facility] ORS;  Service: Orthopedics;  Laterality: Left;   TOTAL HIP ARTHROPLASTY Right 05/27/2021   Procedure: TOTAL HIP ARTHROPLASTY ANTERIOR APPROACH;  Surgeon: Lyndle Herrlich, MD;  Location: [facility] ORS;  Service: Orthopedics;  Laterality: Right;   TUBAL LIGATION     Family History  Adopted: Yes  Family history unknown: Yes   Social History   Socioeconomic History   Marital status: Widowed    Spouse name: Not on file   Number of children: 2   Years of education: Not on file   Highest education level: Not on file  Occupational History   Occupation: retired  Tobacco Use   Smoking status: Every Day    Current packs/day: 0.33    Average packs/day: 0.3 packs/day for 50.0 years (16.5 ttl pk-yrs)    Types: Cigarettes   Smokeless tobacco: Never   Vaping Use   Vaping status: Never Used  Substance and Sexual Activity   Alcohol use: Yes    Alcohol/week: 14.0 standard drinks of alcohol    Types: 14 Shots of liquor per week    Comment: occasionally. States twice a week   Drug use: Never   Sexual activity: Not Currently  Other Topics Concern   Not on file  Social History Narrative   05/17/20   From: [place] originally, moved in 2006   Living: with husband (marital stress) and adult daughter   Work: retired Air traffic controller      Family: 2 children - Burnett Corrente (lives with her) and Chrissie Noa ([place]) - 2 grandchildren - Urbano Heir (lives with him) and Morrell Riddle       Enjoys: playing games on phone      Exercise: not currently   Diet: low appetite, alcohol       Safety   Seat belts: Yes  Guns: No   Safe in relationships: does not feel safe with husband - has asked him to leave   Social Determinants of Health   Financial Resource Strain: Low Risk  (10/31/2022)   Overall Financial Resource Strain (CARDIA)    Difficulty of Paying Living Expenses: Not hard at all  Food Insecurity: No Food Insecurity (10/31/2022)   Hunger Vital Sign    Worried About Running Out of Food in the Last Year: Never true    Ran Out of Food in the Last Year: Never true  Transportation Needs: No Transportation Needs (10/31/2022)   PRAPARE - Administrator, Civil Service (Medical): No    Lack of Transportation (Non-Medical): No  Physical Activity: Inactive (10/31/2022)   Exercise Vital Sign    Days of Exercise per Week: 0 days    Minutes of Exercise per Session: 0 min  Stress: No Stress Concern Present (10/31/2022)   [employer] of Occupational Health - Occupational Stress Questionnaire    Feeling of Stress : Only a little  Social Connections: Moderately Integrated (10/31/2022)   Social Connection and Isolation Panel [NHANES]    Frequency of Communication with Friends and Family: More than three times a week    Frequency of Social Gatherings  with Friends and Family: More than three times a week    Attends Religious Services: More than 4 times per year    Active Member of [employer] or Organizations: Yes    Attends Banker Meetings: 1 to 4 times per year    Marital Status: Widowed    Tobacco Counseling Ready to quit: No Counseling given: No   Clinical Intake:  Pre-visit preparation completed: Yes  Pain : 0-10 Pain Score: 3  Pain Type: Acute pain Pain Location: Knee Pain Orientation: Right, Left Pain Descriptors / Indicators: Aching Pain Onset: More than a month ago     BMI - recorded: 29.05 Nutritional Status: BMI 25 -29 Overweight Nutritional Risks: None Diabetes: Yes CBG done?: No Did pt. bring in CBG monitor from home?: No  How often do you need to have someone help you when you read instructions, pamphlets, or other written materials from your doctor or pharmacy?: 1 - Never  Interpreter Needed?: No  Information entered by :: C.Isaiha Asare LPN   Activities of Daily Living    10/31/2022    2:58 PM  In your present state of health, do you have any difficulty performing the following activities:  Hearing? 0  Vision? 0  Difficulty concentrating or making decisions? 0  Walking or climbing stairs? 1  Comment just a little slow  Dressing or bathing? 0  Doing errands, shopping? 0  Preparing Food and eating ? N  Using the Toilet? N  In the past six months, have you accidently leaked urine? N  Do you have problems with loss of bowel control? N  Managing your Medications? N  Managing your Finances? N  Housekeeping or managing your Housekeeping? N    Patient Care Team: Eden Emms, NP as PCP - General (Nurse Practitioner) Vilinda Flake, [facility] (Inactive) as Pharmacist (Pharmacist)  Indicate any recent Medical Services you may have received from other than [facility] providers in the past year (date may be approximate).     Assessment:   This is a routine wellness examination for  Megan [name].  Hearing/Vision screen Hearing Screening - Comments:: Denies hearing difficulties   Vision Screening - Comments:: Glasses - [facility] - Due for appt. Will reschedule when returns  from vacation.  Dietary issues and exercise activities discussed:     Goals Addressed             This Visit's Progress    Patient Stated       Make healthy food choices, increase activity and exercise.       Depression Screen    11/04/2022    9:18 AM 05/21/2022   11:10 AM 10/03/2021   11:36 AM 04/10/2021    9:45 AM 02/20/2020   11:25 AM 02/15/2020    8:56 AM 02/15/2020    8:55 AM  PHQ 2/9 Scores  PHQ - 2 Score 0 2 0 0 6 6 3   PHQ- 9 Score  6   16 16      Fall Risk    10/31/2022    2:58 PM 10/03/2021   11:35 AM 10/02/2021   10:11 AM 06/18/2020   10:06 AM 02/20/2020   11:26 AM  Fall Risk   Falls in the past year? 0 0 0 0 0  Number falls in past yr: 0 0 0 0   Injury with Fall? 0 0  0 0  Risk for fall due to : No Fall Risks Medication side effect     Follow up Falls prevention discussed;Falls evaluation completed Falls evaluation completed;Education provided;Falls prevention discussed       MEDICARE RISK AT HOME: Medicare Risk at Home Any stairs in or around the home?: Yes If so, are there any without handrails?: Yes Home free of loose throw rugs in walkways, pet beds, electrical cords, etc?: Yes Adequate lighting in your home to reduce risk of falls?: Yes Life alert?: No Use of a cane, walker or w/c?: Yes Grab bars in the bathroom?: No Shower chair or bench in shower?: No Elevated toilet seat or a handicapped toilet?: Yes  TIMED UP AND GO:  Was the test performed?  No    Cognitive Function:        11/04/2022    9:30 AM 10/03/2021   11:37 AM  6CIT Screen  What Year? 0 points 0 points  What month? 0 points 0 points  What time? 0 points 0 points  Count back from 20 0 points 0 points  Months in reverse 0 points 0 points  Repeat phrase 6 points 0 points  Total Score 6  points 0 points    Immunizations Immunization History  Administered Date(s) Administered   Moderna Sars-Covid-2 Vaccination 07/11/2019, 08/11/2019    TDAP status: Due, Education has been provided regarding the importance of this vaccine. Advised may receive this vaccine at local pharmacy or Health Dept. Aware to provide a copy of the vaccination record if obtained from local pharmacy or Health Dept. Verbalized acceptance and understanding.  Flu Vaccine status: Declined, Education has been provided regarding the importance of this vaccine but patient still declined. Advised may receive this vaccine at local pharmacy or Health Dept. Aware to provide a copy of the vaccination record if obtained from local pharmacy or Health Dept. Verbalized acceptance and understanding.  Pneumococcal vaccine status: Declined,  Education has been provided regarding the importance of this vaccine but patient still declined. Advised may receive this vaccine at local pharmacy or Health Dept. Aware to provide a copy of the vaccination record if obtained from local pharmacy or Health Dept. Verbalized acceptance and understanding.   Covid-19 vaccine status: Declined, Education has been provided regarding the importance of this vaccine but patient still declined. Advised may receive this vaccine at local pharmacy or  Health Dept.or vaccine clinic. Aware to provide a copy of the vaccination record if obtained from local pharmacy or Health Dept. Verbalized acceptance and understanding.  Qualifies for Shingles Vaccine? Yes   Zostavax completed No   Shingrix Completed?: No.    Education has been provided regarding the importance of this vaccine. Patient has been advised to call insurance company to determine out of pocket expense if they have not yet received this vaccine. Advised may also receive vaccine at local pharmacy or Health Dept. Verbalized acceptance and understanding.  Screening Tests Health Maintenance  Topic Date  Due   Pneumonia Vaccine 85+ Years old (1 of 2 - PCV) Never done   DTaP/Tdap/Td (1 - Tdap) Never done   Zoster Vaccines- Shingrix (1 of 2) Never done   DEXA SCAN  Never done   COVID-19 Vaccine (3 - 2023-24 season) 11/15/2021   Diabetic kidney evaluation - Urine ACR  04/10/2022   OPHTHALMOLOGY EXAM  09/03/2022   INFLUENZA VACCINE  10/16/2022   HEMOGLOBIN A1C  11/21/2022   Diabetic kidney evaluation - eGFR measurement  07/15/2023   FOOT EXAM  07/24/2023   Medicare Annual Wellness (AWV)  11/04/2023   MAMMOGRAM  07/02/2024   Colonoscopy  06/26/2027   Hepatitis C Screening  Completed   HPV VACCINES  Aged Out    Health Maintenance  Health Maintenance Due  Topic Date Due   Pneumonia Vaccine 11+ Years old (1 of 2 - PCV) Never done   DTaP/Tdap/Td (1 - Tdap) Never done   Zoster Vaccines- Shingrix (1 of 2) Never done   DEXA SCAN  Never done   COVID-19 Vaccine (3 - 2023-24 season) 11/15/2021   Diabetic kidney evaluation - Urine ACR  04/10/2022   OPHTHALMOLOGY EXAM  09/03/2022   INFLUENZA VACCINE  10/16/2022    Colorectal cancer screening: Type of screening: Colonoscopy. Completed 06/25/20. Repeat every 7 years  Mammogram status: Completed 06/23/22. Repeat every year Bone Scan- pt will discuss with PCP.  Lung Cancer Screening: (Low Dose CT Chest recommended if Age 85-80 years, 20 pack-year currently smoking OR have quit w/in 15years.) does not qualify.   Lung Cancer Screening Referral:    Additional Screening:  Hepatitis C Screening: does qualify; Completed 02/15/20  Vision Screening: Recommended annual ophthalmology exams for early detection of glaucoma and other disorders of the eye. Is the patient up to date with their annual eye exam?  No , pt will call for appointment once she returns from her vacation. Who is the provider or what is the name of the office in which the patient attends annual eye exams? [facility] If pt is not established with a provider, would they like to  be referred to a provider to establish care? Yes .   Dental Screening: Recommended annual dental exams for proper oral hygiene  Diabetic Foot Exam: Diabetic Foot Exam: Completed 07/24/22  Community Resource Referral / Chronic Care Management: CRR required this visit?  No   CCM required this visit?  No     Plan:     I have personally reviewed and noted the following in the patient's chart:   Medical and social history Use of alcohol, tobacco or illicit drugs  Current medications and supplements including opioid prescriptions. Patient is not currently taking opioid prescriptions. Functional ability and status Nutritional status Physical activity Advanced directives List of other physicians Hospitalizations, surgeries, and ER visits in previous 12 months Vitals Screenings to include cognitive, depression, and falls Referrals and appointments  In addition,  I have reviewed and discussed with patient certain preventive protocols, quality metrics, and best practice recommendations. A written personalized care plan for preventive services as well as general preventive health recommendations were provided to patient.     Maryan Puls, LPN   09/13/5282   After Visit Summary: (MyChart) Due to this being a telephonic visit, the after visit summary with patients personalized plan was offered to patient via MyChart   Nurse Notes: Vaccinations: declines all Influenza vaccine: recommend every Fall Pneumococcal vaccine: recommend once per lifetime Prevnar-20 Tdap vaccine: recommend every 10 years Shingles vaccine: recommend Shingrix which is 2 doses 2-6 months apart and over 90% effective     Covid-19: recommend 2 doses one month apart with a booster 6 months later

## 2022-11-04 NOTE — Patient Instructions (Signed)
Megan [name] , Thank you for taking time to come for your Medicare Wellness Visit. I appreciate your ongoing commitment to your health goals. Please review the following plan we discussed and let me know if I can assist you in the future.   Referrals/Orders/Follow-Ups/Clinician Recommendations: If you wish to quit smoking, help is available. For free tobacco cessation program offerings call the [facility] at [phone] or Live Well Line at 201-073-2068. You may also visit www.[employer].com or email livelifewell@[email or web address] .com for more information on other programs.   You may also call 1-800-QUIT-NOW (630-189-9232) or visit www.[place].ch or www.BecomeAnEx.org for additional resources on smoking cessation.    Aim for 30 minutes of exercise or brisk walking, 6-8 glasses of water, and 5 servings of fruits and vegetables each day.   This is a list of the screening recommended for you and due dates:  Health Maintenance  Topic Date Due   Pneumonia Vaccine (1 of 2 - PCV) Never done   DTaP/Tdap/Td vaccine (1 - Tdap) Never done   Zoster (Shingles) Vaccine (1 of 2) Never done   DEXA scan (bone density measurement)  Never done   COVID-19 Vaccine (3 - 2023-24 season) 11/15/2021   Yearly kidney health urinalysis for diabetes  04/10/2022   Eye exam for diabetics  09/03/2022   Medicare Annual Wellness Visit  10/04/2022   Flu Shot  10/16/2022   Hemoglobin A1C  11/21/2022   Yearly kidney function blood test for diabetes  07/15/2023   Complete foot exam   07/24/2023   Mammogram  07/02/2024   Colon Cancer Screening  06/26/2027   Hepatitis C Screening  Completed   HPV Vaccine  Aged Out    Advanced directives: (Declined) Advance directive discussed with you today. Even though you declined this today, please call our office should you change your mind, and we can give you the proper paperwork for you to fill out.  Next Medicare Annual Wellness Visit scheduled for next year:  Yes  Preventive Care 62 Years and Older, Female Preventive care refers to lifestyle choices and visits with your health care provider that can promote health and wellness. What does preventive care include? A yearly physical exam. This is also called an annual well check. Dental exams once or twice a year. Routine eye exams. Ask your health care provider how often you should have your eyes checked. Personal lifestyle choices, including: Daily care of your teeth and gums. Regular physical activity. Eating a healthy diet. Avoiding tobacco and drug use. Limiting alcohol use. Practicing safe sex. Taking low-dose aspirin every day. Taking vitamin and mineral supplements as recommended by your health care provider. What happens during an annual well check? The services and screenings done by your health care provider during your annual well check will depend on your age, overall health, lifestyle risk factors, and family history of disease. Counseling  Your health care provider may ask you questions about your: Alcohol use. Tobacco use. Drug use. Emotional well-being. Home and relationship well-being. Sexual activity. Eating habits. History of falls. Memory and ability to understand (cognition). Work and work Astronomer. Reproductive health. Screening  You may have the following tests or measurements: Height, weight, and BMI. Blood pressure. Lipid and cholesterol levels. These may be checked every 5 years, or more frequently if you are over 51 years old. Skin check. Lung cancer screening. You may have this screening every year starting at age 79 if you have a 30-pack-year history of smoking and currently smoke or  have quit within the past 15 years. Fecal occult blood test (FOBT) of the stool. You may have this test every year starting at age 80. Flexible sigmoidoscopy or colonoscopy. You may have a sigmoidoscopy every 5 years or a colonoscopy every 10 years starting at age  23. Hepatitis C blood test. Hepatitis B blood test. Sexually transmitted disease (STD) testing. Diabetes screening. This is done by checking your blood sugar (glucose) after you have not eaten for a while (fasting). You may have this done every 1-3 years. Bone density scan. This is done to screen for osteoporosis. You may have this done starting at age 73. Mammogram. This may be done every 1-2 years. Talk to your health care provider about how often you should have regular mammograms. Talk with your health care provider about your test results, treatment options, and if necessary, the need for more tests. Vaccines  Your health care provider may recommend certain vaccines, such as: Influenza vaccine. This is recommended every year. Tetanus, diphtheria, and acellular pertussis (Tdap, Td) vaccine. You may need a Td booster every 10 years. Zoster vaccine. You may need this after age 61. Pneumococcal 13-valent conjugate (PCV13) vaccine. One dose is recommended after age 82. Pneumococcal polysaccharide (PPSV23) vaccine. One dose is recommended after age 29. Talk to your health care provider about which screenings and vaccines you need and how often you need them. This information is not intended to replace advice given to you by your health care provider. Make sure you discuss any questions you have with your health care provider. Document Released: 03/30/2015 Document Revised: 11/21/2015 Document Reviewed: 01/02/2015 Elsevier Interactive Patient Education  2017 [employer].  Fall Prevention in the Home Falls can cause injuries. They can happen to people of all ages. There are many things you can do to make your home safe and to help prevent falls. What can I do on the outside of my home? Regularly fix the edges of walkways and driveways and fix any cracks. Remove anything that might make you trip as you walk through a door, such as a raised step or threshold. Trim any bushes or trees on the  path to your home. Use bright outdoor lighting. Clear any walking paths of anything that might make someone trip, such as rocks or tools. Regularly check to see if handrails are loose or broken. Make sure that both sides of any steps have handrails. Any raised decks and porches should have guardrails on the edges. Have any leaves, snow, or ice cleared regularly. Use sand or salt on walking paths during winter. Clean up any spills in your garage right away. This includes oil or grease spills. What can I do in the bathroom? Use night lights. Install grab bars by the toilet and in the tub and shower. Do not use towel bars as grab bars. Use non-skid mats or decals in the tub or shower. If you need to sit down in the shower, use a plastic, non-slip stool. Keep the floor dry. Clean up any water that spills on the floor as soon as it happens. Remove soap buildup in the tub or shower regularly. Attach bath mats securely with double-sided non-slip rug tape. Do not have throw rugs and other things on the floor that can make you trip. What can I do in the bedroom? Use night lights. Make sure that you have a light by your bed that is easy to reach. Do not use any sheets or blankets that are too big for your  bed. They should not hang down onto the floor. Have a firm chair that has side arms. You can use this for support while you get dressed. Do not have throw rugs and other things on the floor that can make you trip. What can I do in the kitchen? Clean up any spills right away. Avoid walking on wet floors. Keep items that you use a lot in easy-to-reach places. If you need to reach something above you, use a strong step stool that has a grab bar. Keep electrical cords out of the way. Do not use floor polish or wax that makes floors slippery. If you must use wax, use non-skid floor wax. Do not have throw rugs and other things on the floor that can make you trip. What can I do with my stairs? Do not  leave any items on the stairs. Make sure that there are handrails on both sides of the stairs and use them. Fix handrails that are broken or loose. Make sure that handrails are as long as the stairways. Check any carpeting to make sure that it is firmly attached to the stairs. Fix any carpet that is loose or worn. Avoid having throw rugs at the top or bottom of the stairs. If you do have throw rugs, attach them to the floor with carpet tape. Make sure that you have a light switch at the top of the stairs and the bottom of the stairs. If you do not have them, ask someone to add them for you. What else can I do to help prevent falls? Wear shoes that: Do not have high heels. Have rubber bottoms. Are comfortable and fit you well. Are closed at the toe. Do not wear sandals. If you use a stepladder: Make sure that it is fully opened. Do not climb a closed stepladder. Make sure that both sides of the stepladder are locked into place. Ask someone to hold it for you, if possible. Clearly mark and make sure that you can see: Any grab bars or handrails. First and last steps. Where the edge of each step is. Use tools that help you move around (mobility aids) if they are needed. These include: Canes. Walkers. Scooters. Crutches. Turn on the lights when you go into a dark area. Replace any light bulbs as soon as they burn out. Set up your furniture so you have a clear path. Avoid moving your furniture around. If any of your floors are uneven, fix them. If there are any pets around you, be aware of where they are. Review your medicines with your doctor. Some medicines can make you feel dizzy. This can increase your chance of falling. Ask your doctor what other things that you can do to help prevent falls. This information is not intended to replace advice given to you by your health care provider. Make sure you discuss any questions you have with your health care provider. Document Released:  12/28/2008 Document Revised: 08/09/2015 Document Reviewed: 04/07/2014 Elsevier Interactive Patient Education  2017 [employer].

## 2022-11-24 ENCOUNTER — Other Ambulatory Visit: Payer: Self-pay | Admitting: Nurse Practitioner

## 2022-11-24 MED ORDER — ASPIRIN 81 MG PO CHEW: 81.0000 mg | CHEWABLE_TABLET | Freq: Two times a day (BID) | ORAL | 3 refills | Status: AC

## 2022-12-17 ENCOUNTER — Ambulatory Visit (INDEPENDENT_AMBULATORY_CARE_PROVIDER_SITE_OTHER): Payer: 59 | Admitting: Nurse Practitioner

## 2022-12-17 ENCOUNTER — Encounter: Payer: Self-pay | Admitting: Nurse Practitioner

## 2022-12-17 VITALS — BP 128/74 | HR 80 | Temp 98.1°F | Ht 66.0 in | Wt 184.2 lb

## 2022-12-17 DIAGNOSIS — Z7984 Long term (current) use of oral hypoglycemic drugs: Secondary | ICD-10-CM | POA: Diagnosis not present

## 2022-12-17 DIAGNOSIS — E213 Hyperparathyroidism, unspecified: Secondary | ICD-10-CM

## 2022-12-17 DIAGNOSIS — I10 Essential (primary) hypertension: Secondary | ICD-10-CM | POA: Diagnosis not present

## 2022-12-17 DIAGNOSIS — D509 Iron deficiency anemia, unspecified: Secondary | ICD-10-CM | POA: Diagnosis not present

## 2022-12-17 DIAGNOSIS — E119 Type 2 diabetes mellitus without complications: Secondary | ICD-10-CM

## 2022-12-17 DIAGNOSIS — K7030 Alcoholic cirrhosis of liver without ascites: Secondary | ICD-10-CM

## 2022-12-17 DIAGNOSIS — Z0000 Encounter for general adult medical examination without abnormal findings: Secondary | ICD-10-CM | POA: Diagnosis not present

## 2022-12-17 DIAGNOSIS — K219 Gastro-esophageal reflux disease without esophagitis: Secondary | ICD-10-CM

## 2022-12-17 DIAGNOSIS — Z720 Tobacco use: Secondary | ICD-10-CM | POA: Diagnosis not present

## 2022-12-17 DIAGNOSIS — I700 Atherosclerosis of aorta: Secondary | ICD-10-CM | POA: Diagnosis not present

## 2022-12-17 DIAGNOSIS — Z13820 Encounter for screening for osteoporosis: Secondary | ICD-10-CM

## 2022-12-17 DIAGNOSIS — Z122 Encounter for screening for malignant neoplasm of respiratory organs: Secondary | ICD-10-CM

## 2022-12-17 LAB — CBC
HCT: 40.1 % (ref 36.0–46.0)
Hemoglobin: 13.1 g/dL (ref 12.0–15.0)
MCHC: 32.7 g/dL (ref 30.0–36.0)
MCV: 101.3 fL — ABNORMAL HIGH (ref 78.0–100.0)
Platelets: 215 10*3/uL (ref 150.0–400.0)
RBC: 3.96 Mil/uL (ref 3.87–5.11)
RDW: 13.9 % (ref 11.5–15.5)
WBC: 8.1 10*3/uL (ref 4.0–10.5)

## 2022-12-17 LAB — COMPREHENSIVE METABOLIC PANEL
ALT: 29 U/L (ref 0–35)
AST: 52 U/L — ABNORMAL HIGH (ref 0–37)
Albumin: 4 g/dL (ref 3.5–5.2)
Alkaline Phosphatase: 344 U/L — ABNORMAL HIGH (ref 39–117)
BUN: 13 mg/dL (ref 6–23)
CO2: 28 meq/L (ref 19–32)
Calcium: 11.2 mg/dL — ABNORMAL HIGH (ref 8.4–10.5)
Chloride: 104 meq/L (ref 96–112)
Creatinine, Ser: 0.79 mg/dL (ref 0.40–1.20)
GFR: 76.96 mL/min (ref >60.00)
Glucose, Bld: 129 mg/dL — ABNORMAL HIGH (ref 70–99)
Potassium: 4.3 meq/L (ref 3.5–5.1)
Sodium: 138 meq/L (ref 135–145)
Total Bilirubin: 0.7 mg/dL (ref 0.2–1.2)
Total Protein: 7.7 g/dL (ref 6.0–8.3)

## 2022-12-17 LAB — POCT GLYCOSYLATED HEMOGLOBIN (HGB A1C): Hemoglobin A1C: 6.6 % — AB (ref 4.0–5.6)

## 2022-12-17 LAB — LIPID PANEL
Cholesterol: 203 mg/dL — ABNORMAL HIGH (ref 0–200)
HDL: 67.4 mg/dL (ref >39.00)
LDL Cholesterol: 95 mg/dL (ref 0–99)
NonHDL: 136.06
Total CHOL/HDL Ratio: 3
Triglycerides: 204 mg/dL — ABNORMAL HIGH (ref 0.0–149.0)
VLDL: 40.8 mg/dL — ABNORMAL HIGH (ref 0.0–40.0)

## 2022-12-17 LAB — URINALYSIS, MICROSCOPIC ONLY: RBC / HPF: NONE SEEN (ref 0–?)

## 2022-12-17 LAB — MICROALBUMIN / CREATININE URINE RATIO
Creatinine,U: 66.8 mg/dL
Microalb Creat Ratio: 3.2 mg/g (ref 0.0–30.0)
Microalb, Ur: 2.1 mg/dL — ABNORMAL HIGH (ref 0.0–1.9)

## 2022-12-17 LAB — TSH: TSH: 0.85 u[IU]/mL (ref 0.35–5.50)

## 2022-12-17 LAB — IBC + FERRITIN
Ferritin: 47.9 ng/mL (ref 10.0–291.0)
Iron: 307 ug/dL — ABNORMAL HIGH (ref 42–145)
Saturation Ratios: 74.8 % — ABNORMAL HIGH (ref 20.0–50.0)
TIBC: 410.2 ug/dL (ref 250.0–450.0)
Transferrin: 293 mg/dL (ref 212.0–360.0)

## 2022-12-17 NOTE — Assessment & Plan Note (Signed)
Patient currently maintained on sitagliptin and Farxiga.  Does not check blood pressures at home.  A1c within normal limits.  Continue medication as prescribed follow-up in 6 months

## 2022-12-17 NOTE — Assessment & Plan Note (Signed)
History of the same.  Pending CBC and iron panels

## 2022-12-17 NOTE — Assessment & Plan Note (Signed)
Patient currently on statin therapy continue

## 2022-12-17 NOTE — Assessment & Plan Note (Signed)
History of the same pending LDCT referral along with urine microscopy to rule out microscopic hematuria

## 2022-12-17 NOTE — Patient Instructions (Addendum)
Nice to see you today I will be in touch with the labs once I  have them Follow up with me in 3 months sooner if you need me   Get the shingles shot at the pharmacy

## 2022-12-17 NOTE — Assessment & Plan Note (Signed)
History of the same was followed by gastroenterology and they want to do a liver biopsy.  Patient had a bad experience setting of blood biopsy and never followed up.  She is willing to try different GI clinic ambulatory referral to [facility] placed today.  Stressed the importance of patient to stop drinking alcohol and to follow-up with GI as recommended

## 2022-12-17 NOTE — Assessment & Plan Note (Signed)
Patient has history of the same.  Has done EGDs in the past on omeprazole 40 mg with symptom control.  Continue

## 2022-12-17 NOTE — Progress Notes (Signed)
Established Patient Office Visit  Subjective   Patient ID: Megan [name], female    DOB: 1955/08/15  Age: 67 y.o. MRN: [id number]  Chief Complaint  Patient presents with   Annual Exam    HPI  Asthma: patient has an albuterol inhaler that she uses as needed. States that she has not used it in month   HTN: Patient currently maintained on lisinopril 20 mg daily. Does not check at home   GERD: Patient is currently maintained on omeprazole 40. States that it works well. States that if she eats bread she will bloat up and sometimes   Cirrhosis: Patient was seen by Dr. Allegra Lai CT liver abdomen with without contrast performed did show positive breath hepatic cirrhosis with a hypervascular mass.  DM2: Patient currently maintained on sitagliptin 100 mg daily and Farxiga 10 mg. Does not check sugars at home   HLD: Patient currently maintained on atorvastatin 40 mg daily  IDA: Currently maintained on ferrous gluconate daily  for complete physical and follow up of chronic conditions.  Immunizations: -Tetanus: not sure  -Influenza: refused  -Shingles: get at pharmacy  -Pneumonia:  refused   Diet: Fair diet. States that she is eating approx 1 meal and some snacks (fruit) states mostly 2 with snakcs. Coffee and water  Exercise: No regular exercise. steps ups and down around the house  Eye exam: Completes annually. 01/29/2023 appt scehduled  Dental exam: Dentures, as needed  Colonoscopy: Completed in 06/25/2020, repeat in 7 years Lung Cancer Screening: Ambulatory referral today  Pap smear: states that she has not had one. States that she is doing well.  Aged out  Mammogram: 07/03/2022  Dexa: needs updating.  Order placed today  Sleep:  gets up by 6 am and goes to bed aroudn 12-1. States that she feels rested       Review of Systems  Constitutional:  Negative for chills and fever.  Respiratory:  Negative for shortness of breath.   Cardiovascular:  Negative for chest pain  and leg swelling.  Gastrointestinal:  Negative for abdominal pain, blood in stool, constipation, diarrhea, nausea and vomiting.  Genitourinary:  Negative for dysuria and hematuria.  Neurological:  Negative for tingling and headaches.  Psychiatric/Behavioral:  Negative for hallucinations and suicidal ideas.       Objective:     BP 128/74   Pulse 80   Temp 98.1 F (36.7 C) (Oral)   Ht 5\' 6"  (1.676 m)   Wt 184 lb 3.2 oz (83.6 kg)   SpO2 96%   BMI 29.73 kg/m    Physical Exam Vitals and nursing note reviewed.  Constitutional:      Appearance: Normal appearance.  HENT:     Right Ear: Tympanic membrane, ear canal and external ear normal.     Left Ear: Tympanic membrane, ear canal and external ear normal.     Mouth/Throat:     Mouth: Mucous membranes are moist.     Pharynx: Oropharynx is clear.  Eyes:     Extraocular Movements: Extraocular movements intact.     Pupils: Pupils are equal, round, and reactive to light.  Cardiovascular:     Rate and Rhythm: Normal rate and regular rhythm.     Pulses: Normal pulses.     Heart sounds: Normal heart sounds.  Pulmonary:     Effort: Pulmonary effort is normal.     Breath sounds: Normal breath sounds.  Abdominal:     General: Bowel sounds are normal. There is no distension.  Palpations: There is mass.     Tenderness: There is no abdominal tenderness.     Hernia: No hernia is present.     Comments: Liver palpable on abdominal exam  Musculoskeletal:     Right lower leg: No edema.     Left lower leg: No edema.  Lymphadenopathy:     Cervical: No cervical adenopathy.  Skin:    General: Skin is warm.  Neurological:     General: No focal deficit present.     Mental Status: She is alert.     Deep Tendon Reflexes:     Reflex Scores:      Bicep reflexes are 2+ on the right side and 2+ on the left side.      Patellar reflexes are 2+ on the right side and 2+ on the left side.    Comments: Bilateral upper and lower extremity strength  5/5  Psychiatric:        Mood and Affect: Mood normal.        Behavior: Behavior normal.        Thought Content: Thought content normal.        Judgment: Judgment normal.      Results for orders placed or performed in visit on 12/17/22  CBC  Result Value Ref Range   WBC 8.1 4.0 - 10.5 K/uL   RBC 3.96 3.87 - 5.11 Mil/uL   Platelets 215.0 150.0 - 400.0 K/uL   Hemoglobin 13.1 12.0 - 15.0 g/dL   HCT 91.4 78.2 - 95.6 %   MCV 101.3 (H) 78.0 - 100.0 fl   MCHC 32.7 30.0 - 36.0 g/dL   RDW 21.3 08.6 - 57.8 %  Comprehensive metabolic panel  Result Value Ref Range   Sodium 138 135 - 145 mEq/L   Potassium 4.3 3.5 - 5.1 mEq/L   Chloride 104 96 - 112 mEq/L   CO2 28 19 - 32 mEq/L   Glucose, Bld 129 (H) 70 - 99 mg/dL   BUN 13 6 - 23 mg/dL   Creatinine, Ser 4.69 0.40 - 1.20 mg/dL   Total Bilirubin 0.7 0.2 - 1.2 mg/dL   Alkaline Phosphatase 344 (H) 39 - 117 U/L   AST 52 (H) 0 - 37 U/L   ALT 29 0 - 35 U/L   Total Protein 7.7 6.0 - 8.3 g/dL   Albumin 4.0 3.5 - 5.2 g/dL   GFR 62.95 >28.41 mL/min   Calcium 11.2 (H) 8.4 - 10.5 mg/dL  Lipid panel  Result Value Ref Range   Cholesterol 203 (H) 0 - 200 mg/dL   Triglycerides 324.4 (H) 0.0 - 149.0 mg/dL   HDL 01.02 >72.53 mg/dL   VLDL 66.4 (H) 0.0 - 40.3 mg/dL   LDL Cholesterol 95 0 - 99 mg/dL   Total CHOL/HDL Ratio 3    NonHDL 136.06   TSH  Result Value Ref Range   TSH 0.85 0.35 - 5.50 uIU/mL  Microalbumin / creatinine urine ratio  Result Value Ref Range   Microalb, Ur 2.1 (H) 0.0 - 1.9 mg/dL   Creatinine,U 47.4 mg/dL   Microalb Creat Ratio 3.2 0.0 - 30.0 mg/g  Urine Microscopic  Result Value Ref Range   WBC, UA 0-2/hpf 0-2/hpf   RBC / HPF none seen 0-2/hpf   Squamous Epithelial / HPF Few(5-10/hpf) (A) Rare(0-4/hpf)  IBC + Ferritin  Result Value Ref Range   Iron 307 (H) 42 - 145 ug/dL   Transferrin 259.5 638.7 - 360.0 mg/dL   Saturation Ratios 56.4 (H)  20.0 - 50.0 %   Ferritin 47.9 10.0 - 291.0 ng/mL   TIBC 410.2 250.0 - 450.0  mcg/dL  POCT glycosylated hemoglobin (Hb A1C)  Result Value Ref Range   Hemoglobin A1C 6.6 (A) 4.0 - 5.6 %   HbA1c POC (<> result, manual entry)     HbA1c, POC (prediabetic range)     HbA1c, POC (controlled diabetic range)        The 10-year ASCVD risk score (Arnett DK, et al., 2019) is: 42.3%    Assessment & Plan:   Problem List Items Addressed This Visit       Cardiovascular and Mediastinum   Essential hypertension    Patient currently maintained on lisinopril 20 mg daily.  Blood pressure under good control continue medication as prescribed      Aortic arch atherosclerosis (HCC)    Patient currently on statin therapy continue      Relevant Orders   Lipid panel (Completed)     Digestive   Gastroesophageal reflux disease without esophagitis    Patient has history of the same.  Has done EGDs in the past on omeprazole 40 mg with symptom control.  Continue      Cirrhosis of liver without ascites (HCC)    History of the same was followed by gastroenterology and they want to do a liver biopsy.  Patient had a bad experience setting of blood biopsy and never followed up.  She is willing to try different GI clinic ambulatory referral to [facility] GI placed today.  Stressed the importance of patient to stop drinking alcohol and to follow-up with GI as recommended      Relevant Orders   Ambulatory referral to Gastroenterology     Endocrine   Hyperparathyroidism [facility])    History of the same.  Patient has been referred to endocrinologist but has not made appointment      Type 2 diabetes mellitus without complication, without long-term current use of insulin (HCC)    Patient currently maintained on sitagliptin and Farxiga.  Does not check blood pressures at home.  A1c within normal limits.  Continue medication as prescribed follow-up in 6 months      Relevant Orders   POCT glycosylated hemoglobin (Hb A1C) (Completed)   Lipid panel (Completed)   Microalbumin / creatinine urine  ratio (Completed)     Other   Tobacco use    History of the same pending LDCT referral along with urine microscopy to rule out microscopic hematuria      Relevant Orders   Urine Microscopic (Completed)   Iron deficiency anemia    History of the same.  Pending CBC and iron panels      Relevant Orders   IBC + Ferritin (Completed)   Preventative health care - Primary    Discussed age-appropriate immunizations and screening exams.  Did review patient's personal, surgical, social, family history.  Patient is up-to-date on all age-appropriate vaccinations she would like.  Patient is up-to-date on CRC screening and breast cancer screening.  She is aged out of cervical cancer screening.  Order for DEXA scan placed today.  Patient was given information at discharge about preventative healthcare maintenance with anticipatory guidance.      Relevant Orders   CBC (Completed)   Comprehensive metabolic panel (Completed)   TSH (Completed)   Other Visit Diagnoses     Encounter for screening for osteoporosis       Relevant Orders   DG Bone Density   Screening for lung cancer  Relevant Orders   Ambulatory Referral Lung Cancer Screening [facility]       Return in about 6 months (around 06/17/2023) for CPE and Labs.    Audria Nine, NP

## 2022-12-17 NOTE — Assessment & Plan Note (Signed)
Patient currently maintained on lisinopril 20 mg daily.  Blood pressure under good control continue medication as prescribed

## 2022-12-17 NOTE — Assessment & Plan Note (Signed)
History of the same.  Patient has been referred to endocrinologist but has not made appointment

## 2022-12-17 NOTE — Assessment & Plan Note (Signed)
Discussed age-appropriate immunizations and screening exams.  Did review patient's personal, surgical, social, family history.  Patient is up-to-date on all age-appropriate vaccinations she would like.  Patient is up-to-date on CRC screening and breast cancer screening.  She is aged out of cervical cancer screening.  Order for DEXA scan placed today.  Patient was given information at discharge about preventative healthcare maintenance with anticipatory guidance.

## 2022-12-18 ENCOUNTER — Ambulatory Visit
Admission: RE | Admit: 2022-12-18 | Discharge: 2022-12-18 | Disposition: A | Discharge status: Home / Self Care | Payer: 59 | Source: Ambulatory Visit | Attending: Nurse Practitioner | Admitting: Nurse Practitioner

## 2022-12-18 DIAGNOSIS — M859 Disorder of bone density and structure, unspecified: Secondary | ICD-10-CM | POA: Diagnosis not present

## 2022-12-18 DIAGNOSIS — Z13820 Encounter for screening for osteoporosis: Secondary | ICD-10-CM | POA: Insufficient documentation

## 2022-12-18 DIAGNOSIS — Z780 Asymptomatic menopausal state: Secondary | ICD-10-CM | POA: Insufficient documentation

## 2023-01-02 ENCOUNTER — Encounter: Payer: Self-pay | Admitting: Pharmacist

## 2023-01-02 ENCOUNTER — Other Ambulatory Visit: Payer: Self-pay | Admitting: Nurse Practitioner

## 2023-01-02 DIAGNOSIS — I10 Essential (primary) hypertension: Secondary | ICD-10-CM

## 2023-01-02 DIAGNOSIS — E119 Type 2 diabetes mellitus without complications: Secondary | ICD-10-CM

## 2023-01-02 DIAGNOSIS — E7800 Pure hypercholesterolemia, unspecified: Secondary | ICD-10-CM

## 2023-01-02 MED ORDER — ATORVASTATIN CALCIUM 40 MG PO TABS: 40.0000 mg | ORAL_TABLET | Freq: Every day | ORAL | 1 refills | Status: DC

## 2023-01-02 MED ORDER — LISINOPRIL 20 MG PO TABS: 20.0000 mg | ORAL_TABLET | ORAL | 1 refills | Status: DC

## 2023-01-02 NOTE — Progress Notes (Signed)
90 day supplies of both medications have been sent in

## 2023-01-02 NOTE — Progress Notes (Signed)
Pharmacy Quality Measure Review  This patient is appearing on a report for being at risk of failing the adherence measure for hypertension (ACEi/ARB) medications this calendar year.   Medication: lisinopril 20 mg tablet Last fill date: 11/21/22 for 30 day supply  Zero refills remaining on current lisinopril prescription. Also noted atorvastatin also out of refills at this time.  Suggest 90 day supply moving forward to minimize adherence risk. (Patient can always request smaller amount at pharmacy if they prefer).   MyChart message sent to patient. and Will collaborate with provider to facilitate refill needs.

## 2023-01-02 NOTE — Telephone Encounter (Signed)
90 day supply of both medications were sent in

## 2023-01-05 ENCOUNTER — Encounter: Payer: Self-pay | Admitting: Podiatry

## 2023-01-05 ENCOUNTER — Ambulatory Visit (INDEPENDENT_AMBULATORY_CARE_PROVIDER_SITE_OTHER): Payer: 59 | Admitting: Podiatry

## 2023-01-05 DIAGNOSIS — S90426A Blister (nonthermal), unspecified lesser toe(s), initial encounter: Secondary | ICD-10-CM | POA: Diagnosis not present

## 2023-01-05 DIAGNOSIS — E1151 Type 2 diabetes mellitus with diabetic peripheral angiopathy without gangrene: Secondary | ICD-10-CM

## 2023-01-05 DIAGNOSIS — Q828 Other specified congenital malformations of skin: Secondary | ICD-10-CM | POA: Diagnosis not present

## 2023-01-05 DIAGNOSIS — B351 Tinea unguium: Secondary | ICD-10-CM

## 2023-01-05 NOTE — Progress Notes (Signed)
  Subjective:  Patient ID: Megan [name], female    DOB: 10-21-1955,  MRN: [id number]  67 y.o. female presents with at risk foot care. Pt has h/o NIDDM with PAD and painful porokeratotic lesion(s) of both feet and painful mycotic toenails that limit ambulation. Painful toenails interfere with ambulation. Aggravating factors include wearing enclosed shoe gear. Pain is relieved with periodic professional debridement. Painful porokeratotic lesions are aggravated when weightbearing with and without shoegear. Pain is relieved with periodic professional debridement.  Patient states she received shingles vaccine on October 8th and developed a blister on her right 5th toe the next day. She has been applying antibiotic ointment and band-aid.        PCP: Eden Emms, NP.  New problem(s): Patient states  Review of Systems: Negative except as noted in the HPI.   Allergies  Allergen Reactions   Peanut Butter Flavor Anaphylaxis   Peanut-Containing Drug Products Anaphylaxis    Objective:  There were no vitals filed for this visit. Constitutional Patient is a pleasant 67 y.o. African American female WD, WN in NAD. AAO x 3.  Vascular Capillary fill time to digits <3 seconds.  DP/PT pulse(s) are faintly palpable b/l lower extremities. Pedal hair sparse b/l. Lower extremity skin temperature gradient warm to warm b/l. No pain with calf compression b/l. No cyanosis or clubbing noted. No ischemia nor gangrene noted b/l.   Neurologic Protective sensation intact 5/5 intact bilaterally with 10g monofilament b/l. Vibratory sensation intact b/l. No clonus b/l.   Dermatologic Pedal skin is warm and supple b/l.  No interdigital macerations b/l lower extremities.   Evidence of blister right 5th digit devoid of fluid. There is maceration of lesion.  Toenails 1-5 b/l well maintained with adequate length. No erythema, no edema, no drainage, no fluctuance. Porokeratotic lesion(s) submet head 1 b/l, submet head  3 left foot, and submet head 5 right foot. No erythema, no edema, no drainage, no fluctuance.  Orthopedic: Normal muscle strength 5/5 to all lower extremity muscle groups bilaterally. Hammertoe deformity noted 2-5 b/l.   Last HgA1c:     Latest Ref Rng & Units 12/17/2022    9:45 AM 05/21/2022   11:35 AM 02/13/2022   11:18 AM  Hemoglobin A1C  Hemoglobin-A1c 4.0 - 5.6 % 6.6  6.9  6.2     Assessment:   1. Porokeratosis   2. Blister of fifth toe   3. Diabetes mellitus with peripheral circulatory disorder, controlled (HCC)    Plan:  Patient was evaluated and treated and all questions answered. Consent given for treatment as described below: -Patient to continue soft, supportive shoe gear daily. -Porokeratotic lesion(s) submet head 1 b/l, submet head 3 left foot, and submet head 5 right foot pared and enucleated with sterile currette without incident. Total number of lesions debrided=4. -Cleansed right 5th digit. Applied Betadine solution to assist with drying macerated right 5th digit. She is to apply Betadine paint to digit once daily. -Patient/POA to call should there be question/concern in the interim. -Follow up in 3 weeks with Dr. Lilian Kapur for toe check right 5th digit. Call sooner if any concerns arise. Return in about 9 weeks (around 03/09/2023) for diabetic foot care.  Freddie Breech, DPM

## 2023-01-09 ENCOUNTER — Other Ambulatory Visit: Payer: Self-pay | Admitting: Pharmacist

## 2023-01-11 ENCOUNTER — Other Ambulatory Visit: Payer: Self-pay | Admitting: Pharmacist

## 2023-01-11 ENCOUNTER — Encounter: Payer: Self-pay | Admitting: Pharmacist

## 2023-01-11 NOTE — Progress Notes (Signed)
Pharmacy Quality Measure Review  This patient is appearing on a report for being at risk of failing the adherence measure for hypertension (ACEi/ARB) medications this calendar year.   Medication: lisinopril 20 mg Last fill date: 9/6 for 30 day supply  Called patient to discuss, but no answer. New prescription was sent in on 10/18. MyChart message sent to patient.   Jarrett Ables, PharmD PGY-1 Pharmacy Resident

## 2023-01-13 ENCOUNTER — Other Ambulatory Visit: Payer: Self-pay

## 2023-01-13 ENCOUNTER — Telehealth: Payer: Self-pay | Admitting: Nurse Practitioner

## 2023-01-13 DIAGNOSIS — F17210 Nicotine dependence, cigarettes, uncomplicated: Secondary | ICD-10-CM

## 2023-01-13 DIAGNOSIS — Z87891 Personal history of nicotine dependence: Secondary | ICD-10-CM

## 2023-01-13 DIAGNOSIS — Z122 Encounter for screening for malignant neoplasm of respiratory organs: Secondary | ICD-10-CM

## 2023-01-13 NOTE — Telephone Encounter (Signed)
Because of her tobacco use. This is something we do with smokers or former smokers to make sure there are no nodules or masses on the lungs

## 2023-01-13 NOTE — Telephone Encounter (Signed)
Called patient reviewed all information and repeated back to me. Will call if any questions. I have given patient contact information to call office to set up exam. She will let us know if any issues.

## 2023-01-13 NOTE — Telephone Encounter (Signed)
Patient called in and wanted to know why she had a referral placed to have a lung cancer screening. Please advise. Thank you!

## 2023-01-16 ENCOUNTER — Ambulatory Visit (INDEPENDENT_AMBULATORY_CARE_PROVIDER_SITE_OTHER): Payer: 59 | Admitting: Acute Care

## 2023-01-16 ENCOUNTER — Other Ambulatory Visit: Payer: Self-pay | Admitting: Pharmacist

## 2023-01-16 DIAGNOSIS — F17210 Nicotine dependence, cigarettes, uncomplicated: Secondary | ICD-10-CM

## 2023-01-16 NOTE — Progress Notes (Addendum)
Pharmacy Quality Measure Review  This patient is appearing on a report for being at risk of failing the adherence measure for cholesterol (statin) and hypertension (ACEi/ARB) medications this calendar year.   Medication: atorvastatin 40 mg, lisinopril 20 mg Last fill date: 11/21/22 for 30 day supply for both medications  Messaged MD 10/18 to send refills on both which was completed same day. Patient was messaged again regarding refills and acknowledged message.  Medications still not filled. Called and spoke to patient today to ensure no other barriers present to medications access.   Patient states that she still has medication remaining in both bottles and has been confused regarding insurance calls regarding adherence.   We reviewed her last fill date. Confirmed both Rx have gone to the correct pharmacy. Patient reports she will call pharmacy today for her refills.   We also discussed that she has one 90ds remaining on both Farxiga and Januvia which are due in 1 month (last filled  11/21/22).   Reviewed medication indication, dosing, and goals of therapy.  and patient verbalized plan to call her pharmacy today for refills.    Addendum:  All overdue medications were successfully filled and dispensed on 01/16/2023 for 30 ds  Atorvastatin Lisniopril Januvia Farxiga

## 2023-01-16 NOTE — Progress Notes (Signed)
  Virtual Visit via Telephone Note  I connected with Megan [name] on 01/16/23 at  1:30 PM EDT by telephone and verified that I am speaking with the correct person using two identifiers.  Location: Patient: in home Provider: [address], [place], [place], [address]    Shared Decision Making Visit Lung Cancer Screening  Program [id number])   Eligibility: Age 67 y.o. Pack Years Smoking History Calculation 30 (# packs/per year x # years smoked) Recent History of coughing up blood  no Unexplained weight loss? no ( >Than 15 pounds within the last 6 months ) Prior History Lung / other cancer no (Diagnosis within the last 5 years already requiring surveillance chest CT Scans). Smoking Status Current Smoker Former Smokers: Years since quit: NA  Quit Date: NA  Visit Components: Discussion included one or more decision making aids. yes Discussion included risk/benefits of screening. yes Discussion included potential follow up diagnostic testing for abnormal scans. yes Discussion included meaning and risk of over diagnosis. yes Discussion included meaning and risk of False Positives. yes Discussion included meaning of total radiation exposure. yes  Counseling Included: Importance of adherence to annual lung cancer LDCT screening. yes Impact of comorbidities on ability to participate in the program. yes Ability and willingness to under diagnostic treatment. yes  Smoking Cessation Counseling: Current Smokers:  Discussed importance of smoking cessation. yes Information about tobacco cessation classes and interventions provided to patient. yes Patient provided with "ticket" for LDCT Scan. yes Symptomatic Patient. no  Counseling NA Diagnosis Code: Tobacco Use Z72.0 Asymptomatic Patient yes  Counseling (Intermediate counseling: > three minutes counseling) U0454 Former Smokers:  Discussed the importance of maintaining cigarette abstinence. yes Diagnosis Code: Personal  History of Nicotine Dependence. U98.119 Information about tobacco cessation classes and interventions provided to patient. Yes Patient provided with "ticket" for LDCT Scan. yes Written Order for Lung Cancer Screening with LDCT placed in Epic. Yes (CT Chest Lung Cancer Screening Low Dose W/O CM) [id number] Z12.2-Screening of respiratory organs Z87.891-Personal history of nicotine dependence   Karl Bales, RN 01/16/23

## 2023-01-16 NOTE — Patient Instructions (Signed)

## 2023-01-23 ENCOUNTER — Ambulatory Visit: Admission: RE | Admit: 2023-01-23 | Payer: 59 | Source: Ambulatory Visit

## 2023-01-26 ENCOUNTER — Ambulatory Visit: Payer: 59 | Admitting: Podiatry

## 2023-01-29 LAB — HM DIABETES EYE EXAM

## 2023-03-19 ENCOUNTER — Ambulatory Visit (INDEPENDENT_AMBULATORY_CARE_PROVIDER_SITE_OTHER): Payer: 59 | Admitting: Podiatry

## 2023-03-19 ENCOUNTER — Encounter: Payer: Self-pay | Admitting: Podiatry

## 2023-03-19 DIAGNOSIS — Q828 Other specified congenital malformations of skin: Secondary | ICD-10-CM

## 2023-03-19 DIAGNOSIS — E1151 Type 2 diabetes mellitus with diabetic peripheral angiopathy without gangrene: Secondary | ICD-10-CM

## 2023-03-19 DIAGNOSIS — B351 Tinea unguium: Secondary | ICD-10-CM

## 2023-03-19 DIAGNOSIS — M79609 Pain in unspecified limb: Secondary | ICD-10-CM

## 2023-03-23 NOTE — Progress Notes (Signed)
  Subjective:  Patient ID: Megan [name], female    DOB: 1955/06/04,  MRN: [id number]  Megan [name] presents to clinic today for at risk foot care. Pt has h/o NIDDM with PAD and painful porokeratotic lesion(s) b/l feet and painful mycotic toenails that limit ambulation. Painful toenails interfere with ambulation. Aggravating factors include wearing enclosed shoe gear. Pain is relieved with periodic professional debridement. Painful porokeratotic lesions are aggravated when weightbearing with and without shoegear. Pain is relieved with periodic professional debridement.  New problem(s): None.   PCP is Wendee Lynwood HERO, NP. Megan [name] 12/17/2022.  Allergies  Allergen Reactions   Peanut Butter Flavoring Agent (Non-Screening) Anaphylaxis   Peanut-Containing Drug Products Anaphylaxis    Review of Systems: Negative except as noted in the HPI.  Objective: No changes noted in today's physical examination. There were no vitals filed for this visit. Megan [name] is a pleasant 68 y.o. female WD, WN in NAD. AAO x 3.  Vascular Examination: CFT <3 seconds b/l. DP/PT pulses faintly palpable b/l. Skin temperature gradient warm to warm b/l. No pain with calf compression. No ischemia or gangrene. No cyanosis or clubbing noted b/l.    Neurological Examination: Sensation grossly intact b/l with 10 gram monofilament. Vibratory sensation intact b/l.   Dermatological Examination: Pedal skin warm and supple b/l.   No open wounds. No interdigital macerations.  Toenails 1-5 b/l thick, discolored, elongated with subungual debris and pain on dorsal palpation.    Porokeratotic lesion(s) submet head 1 b/l, submet head 3 left foot, and submet head 5 right foot. No erythema, no edema, no drainage, no fluctuance.  Musculoskeletal Examination: Normal muscle strength 5/5 to all lower extremity muscle groups bilaterally. Hammertoe deformity noted 2-5 b/l.Megan [name] No pain, crepitus or joint limitation noted with ROM  b/l LE.  Patient ambulates independently without assistive aids.  Radiographs: None  Last A1c:      Latest Ref Rng & Units 12/17/2022    9:45 AM 05/21/2022   11:35 AM  Hemoglobin A1C  Hemoglobin-A1c 4.0 - 5.6 % 6.6  6.9    Assessment/Plan: 1. Pain due to onychomycosis of nail   2. Porokeratosis   3. Diabetes mellitus with peripheral circulatory disorder, controlled (HCC)     -Consent given for treatment as described below: -Examined patient. -Patient to continue soft, supportive shoe gear daily. -Toenails 1-5 b/l were debrided in length and girth with sterile nail nippers and dremel without iatrogenic bleeding.  -Porokeratotic lesion(s) submet head 1 b/l, submet head 3 left foot, and submet head 5 right foot pared and enucleated with sterile currette without incident. Total number of lesions debrided=4. -Patient/POA to call should there be question/concern in the interim.   Return in about 9 weeks (around 05/21/2023).  Megan [name] Merlin, DPM      [place] LOCATION: [address] [place] [address]                   Office [phone]   [facility] : [facility] LOCATION: [address] [place], [place] [address] Office [phone]

## 2023-03-31 LAB — HEMOGLOBIN A1C: Hemoglobin A1C: 7.5

## 2023-04-16 ENCOUNTER — Other Ambulatory Visit: Payer: Self-pay | Admitting: Nurse Practitioner

## 2023-04-16 DIAGNOSIS — E119 Type 2 diabetes mellitus without complications: Secondary | ICD-10-CM

## 2023-04-16 DIAGNOSIS — K219 Gastro-esophageal reflux disease without esophagitis: Secondary | ICD-10-CM

## 2023-05-12 ENCOUNTER — Encounter: Payer: Self-pay | Admitting: Nurse Practitioner

## 2023-05-22 ENCOUNTER — Encounter: Payer: Self-pay | Admitting: Podiatry

## 2023-05-22 ENCOUNTER — Ambulatory Visit (INDEPENDENT_AMBULATORY_CARE_PROVIDER_SITE_OTHER): Payer: 59 | Admitting: Podiatry

## 2023-05-22 VITALS — Ht 66.0 in | Wt 184.2 lb

## 2023-05-22 DIAGNOSIS — M79676 Pain in unspecified toe(s): Secondary | ICD-10-CM | POA: Diagnosis not present

## 2023-05-22 DIAGNOSIS — Q828 Other specified congenital malformations of skin: Secondary | ICD-10-CM | POA: Diagnosis not present

## 2023-05-22 DIAGNOSIS — B351 Tinea unguium: Secondary | ICD-10-CM | POA: Diagnosis not present

## 2023-05-22 DIAGNOSIS — E1151 Type 2 diabetes mellitus with diabetic peripheral angiopathy without gangrene: Secondary | ICD-10-CM | POA: Diagnosis not present

## 2023-05-22 DIAGNOSIS — M79609 Pain in unspecified limb: Secondary | ICD-10-CM | POA: Diagnosis not present

## 2023-05-27 NOTE — Progress Notes (Signed)
 Subjective:  Patient ID: Megan [name], female    DOB: 02-06-1956,  MRN: [id number]  68 y.o. female presents with at risk foot care. Pt has h/o NIDDM with PAD and painful porokeratotic lesion(s) b/l feet and painful mycotic toenails that limit ambulation. Painful toenails interfere with ambulation. Aggravating factors include wearing enclosed shoe gear. Pain is relieved with periodic professional debridement. Painful porokeratotic lesions are aggravated when weightbearing with and without shoegear. Pain is relieved with periodic professional debridement. Chief Complaint  Patient presents with   Nail Problem    Pt is here for [facility] last A1C was 7.4 PCP is Dr Toney Reil and LOV was in October.     PCP: Eden Emms, NP.  New problem(s): None.   Review of Systems: Negative except as noted in the HPI.   Allergies  Allergen Reactions   Peanut Butter Flavoring Agent (Non-Screening) Anaphylaxis   Peanut-Containing Drug Products Anaphylaxis    Objective:  There were no vitals filed for this visit. Constitutional Patient is a pleasant 68 y.o. female WD, WN in NAD. AAO x 3.  Vascular Capillary fill time to digits <3 seconds.  DP/PT pulse(s) are faintly palpable b/l lower extremities. Pedal hair absent b/l. Lower extremity skin temperature gradient warm to warm b/l. No pain with calf compression b/l. No cyanosis or clubbing noted. No ischemia nor gangrene noted b/l.   Neurologic Protective sensation intact 5/5 intact bilaterally with 10g monofilament b/l. Vibratory sensation intact b/l. No clonus b/l.   Dermatologic Pedal skin is thin, shiny and atrophic b/l.  No open wounds b/l lower extremities. No interdigital macerations b/l lower extremities. Toenails 1-5 b/l elongated, discolored, dystrophic, thickened, crumbly with subungual debris and tenderness to dorsal palpation. Porokeratotic lesion(s) submet head 1 b/l, submet head 3 left foot, and submet head 5 right foot. No erythema, no edema, no  drainage, no fluctuance.  Orthopedic: Normal muscle strength 5/5 to all lower extremity muscle groups bilaterally. Hammertoe(s) 2-5 bilaterally.   Last HgA1c:     03/31/2023   12:00 AM 12/17/2022    9:45 AM  Hemoglobin A1C  Hemoglobin-A1c 7.5     6.6      This result is from an external source.     Assessment:   1. Pain due to onychomycosis of nail   2. Porokeratosis   3. Diabetes mellitus with peripheral circulatory disorder, controlled (HCC)    Plan:  Patient was evaluated and treated. All patient's and/or POA's questions/concerns addressed on today's visit. Mycotic toenails 1-5 debrided in length and girth without incident. Porokeratotic lesion(s) submet head 1 b/l, submet head 3 left foot, and submet head 5 right foot pared with sharp debridement without incident. Continue daily foot inspections and monitor blood glucose per PCP/Endocrinologist's recommendations. Continue soft, supportive shoe gear daily. Report any pedal injuries to medical professional. Call office if there are any questions/concerns. -Patient/POA to call should there be question/concern in the interim.  Return in about 9 weeks (around 07/24/2023).  Freddie Breech, DPM      [place] LOCATION: [address], [place] [address]                   Office [phone]   [place] LOCATION:  [address] [place], [place] [address] Office [phone]

## 2023-08-04 ENCOUNTER — Other Ambulatory Visit: Payer: Self-pay | Admitting: Nurse Practitioner

## 2023-08-04 DIAGNOSIS — I10 Essential (primary) hypertension: Secondary | ICD-10-CM

## 2023-08-05 NOTE — Telephone Encounter (Signed)
 Needs office visit in 30 days for DM2

## 2023-08-06 ENCOUNTER — Encounter: Payer: Self-pay | Admitting: Podiatry

## 2023-08-06 ENCOUNTER — Ambulatory Visit (INDEPENDENT_AMBULATORY_CARE_PROVIDER_SITE_OTHER): Admitting: Podiatry

## 2023-08-06 DIAGNOSIS — E119 Type 2 diabetes mellitus without complications: Secondary | ICD-10-CM

## 2023-08-06 DIAGNOSIS — M216X2 Other acquired deformities of left foot: Secondary | ICD-10-CM

## 2023-08-06 DIAGNOSIS — B351 Tinea unguium: Secondary | ICD-10-CM

## 2023-08-06 DIAGNOSIS — E1151 Type 2 diabetes mellitus with diabetic peripheral angiopathy without gangrene: Secondary | ICD-10-CM

## 2023-08-06 DIAGNOSIS — Q828 Other specified congenital malformations of skin: Secondary | ICD-10-CM

## 2023-08-06 DIAGNOSIS — M79609 Pain in unspecified limb: Secondary | ICD-10-CM | POA: Diagnosis not present

## 2023-08-06 DIAGNOSIS — M216X1 Other acquired deformities of right foot: Secondary | ICD-10-CM

## 2023-08-06 NOTE — Telephone Encounter (Signed)
 Called  pt and schedule a appt

## 2023-08-06 NOTE — Progress Notes (Signed)
 ANNUAL DIABETIC FOOT EXAM  Subjective: Megan [name] presents today for annual diabetic foot exam. Chief Complaint  Patient presents with   Diabetes    "The normal, remove the calluses and cut the toenails. "  Winthrop Hawks, NP - 12/19/2022; A1c -    Patient confirms h/o diabetes.  Patient denies any h/o foot wounds.  Megan Gaster, NP is patient's PCP.  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   Hypercholesteremia    Hyperparathyroidism (HCC)    Hypertension    Patient Active Problem List   Diagnosis Date Noted   Preventative health care 12/17/2022   Nasal polyp 07/15/2022   Epistaxis 07/15/2022   Spider veins 07/15/2022   Localized edema 07/15/2022   Serum calcium  elevated 05/21/2022   Pre-operative clearance 02/25/2022   Aortic arch atherosclerosis (HCC) 02/13/2022   Elevated LDL cholesterol level 02/13/2022   Alcohol use 02/13/2022   Low sodium levels 02/13/2022   Grieving 02/13/2022   Mass of left side of neck 02/13/2022   Atypical chest pain 08/08/2021   Porokeratosis 07/22/2021   History of revision of total replacement of right hip joint 05/30/2021   Corns and callus 04/10/2021   Cirrhosis of liver without ascites (HCC)    Iron  deficiency anemia    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/2021   Tobacco use 12/05/2020   Fatty liver disease, nonalcoholic 12/05/2020   Arthritis of left hip 09/20/2020   Assistance needed with transportation 09/20/2020   Hepatomegaly 05/17/2020   Chronic low back pain 03/18/2019   Hyperparathyroidism (HCC) 01/19/2018   Mixed hyperlipidemia 01/19/2018   Class 1 obesity with body mass index (BMI) of 33.0 to 33.9 in  adult 05/27/2017   Essential hypertension 05/27/2017   Gastroesophageal reflux disease without esophagitis 05/27/2017   Mucopurulent chronic bronchitis (HCC) 05/27/2017   Type 2 diabetes mellitus without complication, without long-term current use of insulin (HCC) 05/27/2017   Past Surgical History:  Procedure Laterality Date   COLONOSCOPY WITH PROPOFOL  N/A 06/25/2020   Procedure: COLONOSCOPY WITH PROPOFOL ;  Surgeon: Luke Salaam, MD;  Location: [facility] ENDOSCOPY;  Service: Endoscopy;  Laterality: N/A;   ESOPHAGOGASTRODUODENOSCOPY (EGD) WITH PROPOFOL  N/A 03/27/2021   Procedure: ESOPHAGOGASTRODUODENOSCOPY (EGD) WITH PROPOFOL ;  Surgeon: Selena Daily, MD;  Location: [facility] ENDOSCOPY;  Service: Gastroenterology;  Laterality: N/A;   TOTAL HIP ARTHROPLASTY Left 01/11/2021   Procedure: TOTAL HIP ARTHROPLASTY ANTERIOR APPROACH;  Surgeon: Jerlyn Moons, MD;  Location: [facility] ORS;  Service: Orthopedics;  Laterality: Left;   TOTAL HIP ARTHROPLASTY Right 05/27/2021   Procedure: TOTAL HIP ARTHROPLASTY ANTERIOR APPROACH;  Surgeon: Jerlyn Moons, MD;  Location: [facility] ORS;  Service: Orthopedics;  Laterality: Right;   TUBAL LIGATION     Current Outpatient Medications on File Prior to Visit  Medication Sig Dispense Refill   acetaminophen  (TYLENOL ) 500 MG tablet Take 1,000 mg by mouth 2 (two) times daily.     albuterol  (VENTOLIN  HFA) 108 (90 Base) MCG/ACT inhaler Inhale 1-2 puffs into the lungs every 6 (six) hours as needed for wheezing or shortness of breath. 1 each 0   aspirin  81 MG chewable tablet  Chew 1 tablet (81 mg total) by mouth in the morning and at bedtime. 180 tablet 3   atorvastatin  (LIPITOR) 40 MG tablet Take 1 tablet (40 mg total) by mouth daily. 90 tablet 1   Biotin w/ Vitamins C & E (HAIR/SKIN/NAILS PO) Take 1 capsule by mouth in the morning.     BLACK CURRANT SEED OIL PO Take 1 capsule by mouth in the morning.     cholecalciferol (VITAMIN D3) 25 MCG (1000 UNIT) tablet Take 1,000 Units by mouth  daily.     docusate sodium  (COLACE) 100 MG capsule Take 100 mg by mouth daily.     FARXIGA  10 MG TABS tablet TAKE ONE TABLET BY MOUTH DAILY BEFORE BREAKFAST 90 tablet 1   ferrous gluconate  (FERGON) 240 (27 FE) MG tablet Take 240 mg by mouth daily.     JANUVIA  100 MG tablet TAKE ONE TABLET BY MOUTH DAILY 90 tablet 1   lisinopril  (ZESTRIL ) 20 MG tablet TAKE ONE TABLET BY MOUTH EVERY MORNING 90 tablet 0   Omega-3 Fatty Acids (FISH OIL PO) Take 1 capsule by mouth daily.     omeprazole  (PRILOSEC) 40 MG capsule TAKE ONE CAPSULE BY MOUTH EVERY MORNING 90 capsule 1   glucose blood test strip Use as instructed, to test blood sugar twice daily. (Patient not taking: Reported on 11/04/2022) 100 each 2   Lancets Misc. (ACCU-CHEK FASTCLIX LANCET) KIT Use as directed to test blood sugar twice daily. (Patient not taking: Reported on 11/03/2022) 1 kit 12   No current facility-administered medications on file prior to visit.    Allergies  Allergen Reactions   Peanut Butter Flavoring Agent (Non-Screening) Anaphylaxis   Peanut-Containing Drug Products Anaphylaxis   Social History   Occupational History   Occupation: retired  Tobacco Use   Smoking status: Every Day    Current packs/day: 0.33    Average packs/day: 0.3 packs/day for 50.0 years (16.5 ttl pk-yrs)    Types: Cigarettes   Smokeless tobacco: Never  Vaping Use   Vaping status: Never Used  Substance and Sexual Activity   Alcohol use: Yes    Alcohol/week: 14.0 standard drinks of alcohol    Types: 14 Shots of liquor per week    Comment: occasionally. States twice a week   Drug use: Never   Sexual activity: Not Currently   Family History  Adopted: Yes  Family history unknown: Yes   Immunization History  Administered Date(s) Administered   Moderna Sars-Covid-2 Vaccination 07/11/2019, 08/11/2019     Review of Systems: Negative except as noted in the HPI.   Objective: There were no vitals filed for this visit.  Megan [name] is a  pleasant 68 y.o. female in NAD. AAO X 3.  Diabetic foot exam was performed with the following findings:   Vascular Examination: Capillary refill time immediate b/l. Vascular status intact b/l with faintly palpable pedal pulses. Pedal hair diminished b/l. No pain with calf compression b/l. Skin temperature gradient WNL b/l. No cyanosis or clubbing b/l. No ischemia or gangrene noted b/l. Mild varicosities b/l LE. Evidence of skin changes consistent with long term venous stasis BLE.  Neurological Examination: Sensation grossly intact b/l with 10 gram monofilament. Vibratory sensation intact b/l.   Dermatological Examination: Pedal skin with normal turgor, texture and tone b/l.  No open wounds. No interdigital macerations.   Toenails 1-5 b/l thick, discolored, elongated with subungual debris and pain on dorsal palpation.  Porokeratotic lesion(s) submet head 1 b/l, submet head 3 left foot, and  submet head 5 right foot. No erythema, no edema, no drainage, no fluctuance.   Musculoskeletal Examination: Muscle strength 5/5 to all lower extremity muscle groups bilaterally. Plantarflexed metatarsal(s) .  Radiographs: None     Lab Results  Component Value Date   HGBA1C 7.5 03/31/2023   ADA Risk Categorization: Low Risk :  Patient has all of the following: Intact protective sensation No prior foot ulcer  No severe deformity Pedal pulses present  Assessment: 1. Pain due to onychomycosis of nail   2. Porokeratosis   3. Plantarflexion deformity of both feet   4. Diabetes mellitus with peripheral circulatory disorder, controlled (HCC)   5. Encounter for diabetic foot exam (HCC)      Plan: Diabetic foot examination performed today.  All patient's and/or POA's questions/concerns addressed on today's visit. Toenails 1-5 debrided in length and girth without incident. Porokeratotic lesion(s) submet head 1 b/l, submet head 3 left foot, and submet head 5 right foot pared with sharp debridement  without incident. Continue daily foot inspections and monitor blood glucose per PCP/Endocrinologist's recommendations. Continue soft, supportive shoe gear daily. Report any pedal injuries to medical professional. Call office if there are any questions/concerns. Return in about 9 weeks (around 10/08/2023).  Luella Sager, DPM      [place] LOCATION: [address], [place] [address]                   Office [phone]   [facility] LOCATION: [address] [place], [place] [address] Office [phone]

## 2023-09-07 ENCOUNTER — Ambulatory Visit (INDEPENDENT_AMBULATORY_CARE_PROVIDER_SITE_OTHER): Admitting: Nurse Practitioner

## 2023-09-07 VITALS — BP 110/60 | HR 86 | Temp 98.1°F | Ht 66.0 in | Wt 184.8 lb

## 2023-09-07 DIAGNOSIS — F5089 Other specified eating disorder: Secondary | ICD-10-CM | POA: Diagnosis not present

## 2023-09-07 DIAGNOSIS — E119 Type 2 diabetes mellitus without complications: Secondary | ICD-10-CM

## 2023-09-07 DIAGNOSIS — Z7984 Long term (current) use of oral hypoglycemic drugs: Secondary | ICD-10-CM

## 2023-09-07 DIAGNOSIS — R252 Cramp and spasm: Secondary | ICD-10-CM | POA: Insufficient documentation

## 2023-09-07 DIAGNOSIS — Z79899 Other long term (current) drug therapy: Secondary | ICD-10-CM | POA: Insufficient documentation

## 2023-09-07 LAB — COMPREHENSIVE METABOLIC PANEL WITH GFR
ALT: 17 U/L (ref 0–35)
AST: 39 U/L — ABNORMAL HIGH (ref 0–37)
Albumin: 4.2 g/dL (ref 3.5–5.2)
Alkaline Phosphatase: 259 U/L — ABNORMAL HIGH (ref 39–117)
BUN: 14 mg/dL (ref 6–23)
CO2: 27 meq/L (ref 19–32)
Calcium: 11 mg/dL — ABNORMAL HIGH (ref 8.4–10.5)
Chloride: 101 meq/L (ref 96–112)
Creatinine, Ser: 0.96 mg/dL (ref 0.40–1.20)
GFR: 60.6 mL/min (ref >60.00)
Glucose, Bld: 153 mg/dL — ABNORMAL HIGH (ref 70–99)
Potassium: 4.1 meq/L (ref 3.5–5.1)
Sodium: 136 meq/L (ref 135–145)
Total Bilirubin: 0.5 mg/dL (ref 0.2–1.2)
Total Protein: 7.6 g/dL (ref 6.0–8.3)

## 2023-09-07 LAB — IBC + FERRITIN
Ferritin: 10.8 ng/mL (ref 10.0–291.0)
Iron: 38 ug/dL — ABNORMAL LOW (ref 42–145)
Saturation Ratios: 6.8 % — ABNORMAL LOW (ref 20.0–50.0)
TIBC: 560 ug/dL — ABNORMAL HIGH (ref 250.0–450.0)
Transferrin: 400 mg/dL — ABNORMAL HIGH (ref 212.0–360.0)

## 2023-09-07 LAB — CBC
HCT: 30.5 % — ABNORMAL LOW (ref 36.0–46.0)
Hemoglobin: 10.1 g/dL — ABNORMAL LOW (ref 12.0–15.0)
MCHC: 33 g/dL (ref 30.0–36.0)
MCV: 91.5 fl (ref 78.0–100.0)
Platelets: 239 10*3/uL (ref 150.0–400.0)
RBC: 3.33 Mil/uL — ABNORMAL LOW (ref 3.87–5.11)
RDW: 15 % (ref 11.5–15.5)
WBC: 7.7 10*3/uL (ref 4.0–10.5)

## 2023-09-07 LAB — POCT GLYCOSYLATED HEMOGLOBIN (HGB A1C): Hemoglobin A1C: 6.5 % — AB (ref 4.0–5.6)

## 2023-09-07 LAB — MAGNESIUM: Magnesium: 1.8 mg/dL (ref 1.5–2.5)

## 2023-09-07 NOTE — Assessment & Plan Note (Signed)
 Pending magnesium  level today.

## 2023-09-07 NOTE — Assessment & Plan Note (Signed)
 Currently maintained on Januvia  100 mg and Farxiga  10 mg daily.  A1c well-controlled at 6.5%.  Continue medication as prescribed

## 2023-09-07 NOTE — Assessment & Plan Note (Signed)
 On long-term PPI use.  Will check magnesium .  She can take magnesium  oxide 200 mg 1 tablet daily over-the-counter.  Patient is doing well with hydration continue

## 2023-09-07 NOTE — Patient Instructions (Signed)
 Nice to see you today See  me in approx 4 month  Get magnesium  oxide 200 or 250mg  over the counter and you can take one a day

## 2023-09-07 NOTE — Progress Notes (Signed)
 Established Patient Office Visit  Subjective   Patient ID: Megan [name], female    DOB: 1955/04/10  Age: 68 y.o. MRN: [id number]  Chief Complaint  Patient presents with   Diabetes      DM2: Patient currently maintained on Januvia  100 mg daily, Farxiga  10 mg daily.  Patient is overdue for diabetes follow-up.  Last A1c was 7.5% at another office.  Patient is here for follow-up. Does not check her glucose levels at home.  She is eating 2 meals a day and she does like to snack. She does sugar free cookies and candies. She does like cheese doodles   State that she has been eating ice a lot and been taking over-the-counter iron  supplementation.  When doing so she feels like she does not crave ice as much. States that she has been taking iron  and would like a prescription.  Last time we checked her iron  iron  levels were above normal.  States that she will laugh and get a cramp in the mid back that will not last very long. She is On long-term PPI with omeprazole .    Review of Systems  Constitutional:  Negative for chills and fever.  Respiratory:  Negative for shortness of breath.   Cardiovascular:  Negative for chest pain.  Gastrointestinal:        BM daily   Neurological:  Negative for headaches.      Objective:     BP 110/60   Pulse 86   Temp 98.1 F (36.7 C) (Oral)   Ht 5' 6 (1.676 m)   Wt 184 lb 12.8 oz (83.8 kg)   SpO2 98%   BMI 29.83 kg/m  BP Readings from Last 3 Encounters:  09/07/23 110/60  12/17/22 128/74  07/24/22 (!) 160/87   Wt Readings from Last 3 Encounters:  09/07/23 184 lb 12.8 oz (83.8 kg)  05/22/23 184 lb 3.2 oz (83.6 kg)  12/17/22 184 lb 3.2 oz (83.6 kg)   SpO2 Readings from Last 3 Encounters:  09/07/23 98%  12/17/22 96%  07/15/22 98%      Physical Exam Vitals and nursing note reviewed.  Constitutional:      Appearance: Normal appearance.   Cardiovascular:     Rate and Rhythm: Normal rate and regular rhythm.     Heart sounds:  Normal heart sounds.  Pulmonary:     Effort: Pulmonary effort is normal.     Breath sounds: Normal breath sounds.  Abdominal:     Comments: BS WNL   Neurological:     Mental Status: She is alert.      Results for orders placed or performed in visit on 09/07/23  POCT glycosylated hemoglobin (Hb A1C)  Result Value Ref Range   Hemoglobin A1C 6.5 (A) 4.0 - 5.6 %   HbA1c POC (<> result, manual entry)     HbA1c, POC (prediabetic range)     HbA1c, POC (controlled diabetic range)        The 10-year ASCVD risk score (Arnett DK, et al., 2019) is: 32.7%    Assessment & Plan:   Problem List Items Addressed This Visit       Endocrine   Type 2 diabetes mellitus without complication, without long-term current use of insulin (HCC) - Primary   Currently maintained on Januvia  100 mg and Farxiga  10 mg daily.  A1c well-controlled at 6.5%.  Continue medication as prescribed      Relevant Orders   POCT glycosylated hemoglobin (Hb A1C) (Completed)  CBC   Comprehensive metabolic panel with GFR     Other   Pica   Having the urge to eat ice will check iron  levels.      Relevant Orders   IBC + Ferritin   Long-term current use of proton pump inhibitor therapy   Pending magnesium  level today.      Relevant Orders   Magnesium    Muscle cramping   On long-term PPI use.  Will check magnesium .  She can take magnesium  oxide 200 mg 1 tablet daily over-the-counter.  Patient is doing well with hydration continue      Relevant Orders   CBC   IBC + Ferritin   Comprehensive metabolic panel with GFR    Return in about 4 months (around 01/07/2024) for CPE and Labs.    Adina Crandall, NP

## 2023-09-07 NOTE — Assessment & Plan Note (Signed)
 Having the urge to eat ice will check iron  levels.

## 2023-09-09 ENCOUNTER — Ambulatory Visit: Payer: Self-pay | Admitting: Nurse Practitioner

## 2023-09-09 DIAGNOSIS — D509 Iron deficiency anemia, unspecified: Secondary | ICD-10-CM

## 2023-09-09 MED ORDER — IRON (FERROUS SULFATE) 325 (65 FE) MG PO TABS: 325.0000 mg | ORAL_TABLET | Freq: Every day | ORAL | 0 refills | Status: DC

## 2023-10-07 ENCOUNTER — Other Ambulatory Visit

## 2023-10-08 ENCOUNTER — Ambulatory Visit: Admitting: Podiatry

## 2023-10-13 ENCOUNTER — Other Ambulatory Visit (INDEPENDENT_AMBULATORY_CARE_PROVIDER_SITE_OTHER)

## 2023-10-13 DIAGNOSIS — D509 Iron deficiency anemia, unspecified: Secondary | ICD-10-CM | POA: Diagnosis not present

## 2023-10-13 LAB — IBC + FERRITIN
Ferritin: 54.6 ng/mL (ref 10.0–291.0)
Iron: 49 ug/dL (ref 42–145)
Saturation Ratios: 9.2 % — ABNORMAL LOW (ref 20.0–50.0)
TIBC: 533.4 ug/dL — ABNORMAL HIGH (ref 250.0–450.0)
Transferrin: 381 mg/dL — ABNORMAL HIGH (ref 212.0–360.0)

## 2023-10-13 LAB — CBC
HCT: 30.6 % — ABNORMAL LOW (ref 36.0–46.0)
Hemoglobin: 9.5 g/dL — ABNORMAL LOW (ref 12.0–15.0)
MCHC: 31.2 g/dL (ref 30.0–36.0)
MCV: 87.9 fl (ref 78.0–100.0)
Platelets: 214 K/uL (ref 150.0–400.0)
RBC: 3.48 Mil/uL — ABNORMAL LOW (ref 3.87–5.11)
RDW: 20.6 % — ABNORMAL HIGH (ref 11.5–15.5)
WBC: 7.7 K/uL (ref 4.0–10.5)

## 2023-10-14 ENCOUNTER — Ambulatory Visit: Payer: Self-pay | Admitting: Nurse Practitioner

## 2023-10-14 DIAGNOSIS — D509 Iron deficiency anemia, unspecified: Secondary | ICD-10-CM

## 2023-10-14 MED ORDER — IRON (FERROUS SULFATE) 325 (65 FE) MG PO TABS: 325.0000 mg | ORAL_TABLET | Freq: Every day | ORAL | 0 refills | Status: DC

## 2023-10-27 ENCOUNTER — Other Ambulatory Visit: Payer: Self-pay | Admitting: Nurse Practitioner

## 2023-10-27 DIAGNOSIS — E119 Type 2 diabetes mellitus without complications: Secondary | ICD-10-CM

## 2023-10-27 DIAGNOSIS — E7800 Pure hypercholesterolemia, unspecified: Secondary | ICD-10-CM

## 2023-10-27 DIAGNOSIS — K219 Gastro-esophageal reflux disease without esophagitis: Secondary | ICD-10-CM

## 2023-10-29 ENCOUNTER — Ambulatory Visit (INDEPENDENT_AMBULATORY_CARE_PROVIDER_SITE_OTHER): Admitting: Podiatry

## 2023-10-29 DIAGNOSIS — Z91198 Patient's noncompliance with other medical treatment and regimen for other reason: Secondary | ICD-10-CM

## 2023-10-29 NOTE — Progress Notes (Signed)
 1. Failure to attend appointment with reason given    Appointment rescheduled by patient.

## 2023-11-09 ENCOUNTER — Ambulatory Visit (INDEPENDENT_AMBULATORY_CARE_PROVIDER_SITE_OTHER): Admitting: Podiatry

## 2023-11-09 ENCOUNTER — Encounter: Payer: Self-pay | Admitting: Podiatry

## 2023-11-09 DIAGNOSIS — M79609 Pain in unspecified limb: Secondary | ICD-10-CM

## 2023-11-09 DIAGNOSIS — B351 Tinea unguium: Secondary | ICD-10-CM | POA: Diagnosis not present

## 2023-11-09 DIAGNOSIS — Q828 Other specified congenital malformations of skin: Secondary | ICD-10-CM | POA: Diagnosis not present

## 2023-11-09 DIAGNOSIS — E1151 Type 2 diabetes mellitus with diabetic peripheral angiopathy without gangrene: Secondary | ICD-10-CM | POA: Diagnosis not present

## 2023-11-09 NOTE — Progress Notes (Signed)
  Subjective:  Patient ID: Megan [name], female    DOB: August 23, 1955,  MRN: [id number]  Megan [name] presents to clinic today for at risk foot care. Pt has h/o NIDDM with PAD and painful porokeratotic lesion(s) of both feet and painful mycotic toenails that limit ambulation. Painful toenails interfere with ambulation. Aggravating factors include wearing enclosed shoe gear. Pain is relieved with periodic professional debridement. Painful porokeratotic lesions are aggravated when weightbearing with and without shoegear. Pain is relieved with periodic professional debridement.  Chief Complaint  Patient presents with   DFc    Rm 1 Diabetic foot care/ Dr. Lynwood Crandall last visit June 23,2025/ A1c 6.5   New problem(s): None.   PCP is Crandall Lynwood HERO, NP.  Allergies  Allergen Reactions   Peanut Butter Flavoring Agent (Non-Screening) Anaphylaxis   Peanut-Containing Drug Products Anaphylaxis    Review of Systems: Negative except as noted in the HPI.  Objective:  There were no vitals filed for this visit. Megan [name] is a pleasant 68 y.o. female in NAD. AAO x 3.  Vascular Examination: Capillary refill time immediate b/l. Vascular status intact b/l with faintly palpable pedal pulses. Pedal hair diminished b/l. No pain with calf compression b/l. Skin temperature gradient WNL b/l. No cyanosis or clubbing b/l. No ischemia or gangrene noted b/l. Mild varicosities b/l LE. Evidence of skin changes consistent with long term venous stasis BLE.  Neurological Examination: Sensation grossly intact b/l with 10 gram monofilament. Vibratory sensation intact b/l.   Dermatological Examination: Pedal skin with normal turgor, texture and tone b/l.  No open wounds. No interdigital macerations.   Toenails 1-5 b/l thick, discolored, elongated with subungual debris and pain on dorsal palpation.    Porokeratotic lesion(s) submet head 1 right foot, submet head 3 left foot, and submet head 4 right foot.  No erythema, no edema, no drainage, no fluctuance.  Musculoskeletal Examination: Muscle strength 5/5 to all lower extremity muscle groups bilaterally. Plantarflexed metatarsal(s). Hammertoes 2-5 b/l.  Radiographs: None  Assessment/Plan: 1. Pain due to onychomycosis of nail   2. Porokeratosis   3. Diabetes mellitus with peripheral circulatory disorder, controlled [facility])   Consent given for treatment. Patient examined. All patient's and/or POA's questions/concerns addressed on today's visit. Mycotic toenails 1-5 debrided in length and girth without incident. Porokeratotic lesion(s) submet head 1 right foot, submet head 3 left foot, and submet head 4 right foot pared and enucleated with sharp debridement without incident.Continue daily foot inspections and monitor blood glucose per PCP/Endocrinologist's recommendations. Continue soft, supportive shoe gear daily. Report any pedal injuries to medical professional. Call office if there are any quesitons/concerns. -Patient/POA to call should there be question/concern in the interim.   Return in about 9 weeks (around 01/11/2024).  Megan [name] Merlin, DPM      [place] LOCATION: [address], [place] [address]                   Office [phone]   [facility] LOCATION: [address] [place], [place] [address] Office [phone]

## 2023-11-17 ENCOUNTER — Other Ambulatory Visit (INDEPENDENT_AMBULATORY_CARE_PROVIDER_SITE_OTHER)

## 2023-11-17 DIAGNOSIS — D509 Iron deficiency anemia, unspecified: Secondary | ICD-10-CM | POA: Diagnosis not present

## 2023-11-17 LAB — CBC
HCT: 35.3 % — ABNORMAL LOW (ref 36.0–46.0)
Hemoglobin: 11.4 g/dL — ABNORMAL LOW (ref 12.0–15.0)
MCHC: 32.3 g/dL (ref 30.0–36.0)
MCV: 93.6 fl (ref 78.0–100.0)
Platelets: 220 K/uL (ref 150.0–400.0)
RBC: 3.78 Mil/uL — ABNORMAL LOW (ref 3.87–5.11)
RDW: 21.7 % — ABNORMAL HIGH (ref 11.5–15.5)
WBC: 7.3 K/uL (ref 4.0–10.5)

## 2023-11-17 LAB — IBC + FERRITIN
Ferritin: 67.8 ng/mL (ref 10.0–291.0)
Iron: 49 ug/dL (ref 42–145)
Saturation Ratios: 10.2 % — ABNORMAL LOW (ref 20.0–50.0)
TIBC: 478.8 ug/dL — ABNORMAL HIGH (ref 250.0–450.0)
Transferrin: 342 mg/dL (ref 212.0–360.0)

## 2023-11-19 ENCOUNTER — Ambulatory Visit: Payer: Self-pay | Admitting: Nurse Practitioner

## 2023-11-19 DIAGNOSIS — D509 Iron deficiency anemia, unspecified: Secondary | ICD-10-CM

## 2023-11-19 MED ORDER — IRON (FERROUS SULFATE) 325 (65 FE) MG PO TABS
ORAL_TABLET | ORAL | 0 refills | Status: DC
Start: 2023-11-19 — End: 2024-02-02

## 2023-12-14 ENCOUNTER — Other Ambulatory Visit: Payer: Self-pay | Admitting: Gastroenterology

## 2023-12-14 DIAGNOSIS — K7030 Alcoholic cirrhosis of liver without ascites: Secondary | ICD-10-CM

## 2023-12-23 ENCOUNTER — Ambulatory Visit
Admission: RE | Admit: 2023-12-23 | Discharge: 2023-12-23 | Disposition: A | Discharge status: Home / Self Care | Source: Ambulatory Visit | Attending: Gastroenterology | Admitting: Gastroenterology

## 2023-12-23 ENCOUNTER — Telehealth: Payer: Self-pay | Admitting: Nurse Practitioner

## 2023-12-23 DIAGNOSIS — K7030 Alcoholic cirrhosis of liver without ascites: Secondary | ICD-10-CM | POA: Insufficient documentation

## 2023-12-23 NOTE — Telephone Encounter (Unsigned)
 Copied from CRM #[id number]. Topic: Clinical - Medication Refill >> Dec 23, 2023  3:24 PM Alfonso HERO wrote: Medication: lisinopril  (ZESTRIL ) 20 MG tablet atorvastatin  (LIPITOR) 40 MG tablet JANUVIA  100 MG tablet FARXIGA  10 MG TABS tablet omeprazole  (PRILOSEC) 40 MG capsule Iron , Ferrous Sulfate , 325 (65 Fe) MG TABS   Has the patient contacted their pharmacy? Yes (Agent: If no, request that the patient contact the pharmacy for the refill. If patient does not wish to contact the pharmacy document the reason why and proceed with request.) (Agent: If yes, when and what did the pharmacy advise?)  This is the patient's preferred pharmacy:  [facility] [place], [place] - [address] [place] [address] [place] [address] Phone: [phone] Fax: [phone]  Is this the correct pharmacy for this prescription? Yes If no, delete pharmacy and type the correct one.   Has the prescription been filled recently? Yes  Is the patient out of the medication? Yes  Has the patient been seen for an appointment in the last year OR does the patient have an upcoming appointment? Yes  Can we respond through MyChart? Yes  Agent: Please be advised that Rx refills may take up to 3 business days. We ask that you follow-up with your pharmacy.

## 2023-12-30 ENCOUNTER — Other Ambulatory Visit: Payer: Self-pay | Admitting: Nurse Practitioner

## 2023-12-30 DIAGNOSIS — I10 Essential (primary) hypertension: Secondary | ICD-10-CM

## 2024-01-11 ENCOUNTER — Encounter: Payer: Self-pay | Admitting: Podiatry

## 2024-01-11 ENCOUNTER — Ambulatory Visit: Admitting: Podiatry

## 2024-01-11 DIAGNOSIS — E1151 Type 2 diabetes mellitus with diabetic peripheral angiopathy without gangrene: Secondary | ICD-10-CM | POA: Diagnosis not present

## 2024-01-11 DIAGNOSIS — Q828 Other specified congenital malformations of skin: Secondary | ICD-10-CM | POA: Diagnosis not present

## 2024-01-11 DIAGNOSIS — M79609 Pain in unspecified limb: Secondary | ICD-10-CM | POA: Diagnosis not present

## 2024-01-11 DIAGNOSIS — B351 Tinea unguium: Secondary | ICD-10-CM

## 2024-01-11 NOTE — Patient Instructions (Signed)
Chronic Venous Insufficiency Chronic venous insufficiency is a condition that causes the veins in the legs to struggle to pump blood from the legs to the heart. It is also called venous stasis. This condition can happen when the vein walls are stretched, weakened, or damaged. It can also happen when the valves inside the vein are damaged. With the right treatment, you should be able to still lead an active life. What are the causes? Common causes of this condition include: Venous hypertension. This is high blood pressure inside the veins. Sitting or standing too long. This can cause increased blood pressure in the veins of the leg. Deep vein thrombosis (DVT). This is a blood clot that blocks blood flow in a vein. Phlebitis. This is inflammation of a vein. It can cause a blood clot to form. An abnormal growth of cells (tumor) in the area between your hip bones (pelvis). This can cause blood to back up. What increases the risk? Factors that may make you more likely to get this condition include: Having a family history of the condition. Being overweight. Being pregnant. Not getting enough exercise. Smoking. Having a job that requires you to sit or stand in one place for a long time. Being a certain age. Females in their 23s and 5s and males in their 20s are more likely to get this condition. What are the signs or symptoms? Symptoms of this condition include: Varicose veins. These are veins that are enlarged, bulging, or twisted. Skin breakdown or ulcers. Reddened skin or dark discoloration of the skin on the leg between the knee and ankle. Lipodermatosclerosis. This is brown, smooth, tight, and painful skin just above the ankle. It is often on the inside of the leg. Swelling of the legs. How is this diagnosed? This condition may be diagnosed based on your medical history and a physical exam. You may also need tests, such as: A duplex ultrasound. This shows how blood flows through a blood  vessel. Plethysmography. This tests blood flow. Venogram. This looks at the veins using an X-ray and dye. How is this treated? The goals of treatment are to help you return to an active life and to relieve pain. Treatment may include: Wearing compression stockings. These do not cure the condition but can help relieve symptoms. They can also help stop your condition from getting worse. Sclerotherapy. This involves injecting a solution to shrink damaged veins. Surgery. This may include: Vein stripping. This is when a diseased vein is taken out. Laser ablation surgery. This is when blood flow is cut off through the vein. Repairing or remaking a valve inside the affected vein. Follow these instructions at home: Lifestyle Do not use any products that contain nicotine or tobacco. These products include cigarettes, chewing tobacco, and vaping devices, such as e-cigarettes. If you need help quitting, ask your health care provider. Stay active. Exercise, walk, or do other activities. Ask your provider what activities are safe for you. General instructions Take over-the-counter and prescription medicines only as told by your provider. Drink enough fluid to keep your pee (urine) pale yellow. Wear compression stockings as told by your provider. These stockings help to prevent blood clots and reduce swelling in your legs. Keep all follow-up visits. Your provider will check your legs for any changes and adjust your treatment plan as needed. Contact a health care provider if: You have redness, swelling, or more pain in the affected area. You see a red streak or line that goes up or down from the  area. You have skin breakdown or skin loss. You get an injury in the affected area. You get a fever. Get help right away if: You have severe pain that does not get better with medicine. You get an injury and an open wound in the affected area. Your foot or ankle becomes numb or weak all of a sudden. You have  trouble moving your foot or ankle. Your symptoms do not go away or get worse. You have chest pain. You have shortness of breath. These symptoms may be an emergency. Get help right away. Call 911. Do not wait to see if the symptoms will go away. Do not drive yourself to the hospital. This information is not intended to replace advice given to you by your health care provider. Make sure you discuss any questions you have with your health care provider. Document Revised: 03/18/2022 Document Reviewed: 03/18/2022 Elsevier Patient Education  2024 [employer].

## 2024-01-12 ENCOUNTER — Ambulatory Visit: Admission: RE | Admit: 2024-01-12 | Source: Home / Self Care | Admitting: Gastroenterology

## 2024-01-12 ENCOUNTER — Ambulatory Visit

## 2024-01-12 VITALS — Ht 66.0 in | Wt 184.0 lb

## 2024-01-12 DIAGNOSIS — Z0000 Encounter for general adult medical examination without abnormal findings: Secondary | ICD-10-CM

## 2024-01-12 SURGERY — COLONOSCOPY
Anesthesia: General

## 2024-01-12 NOTE — Patient Instructions (Signed)
 Megan [name],  Thank you for taking the time for your Medicare Wellness Visit. I appreciate your continued commitment to your health goals. Please review the care plan we discussed, and feel free to reach out if I can assist you further.  Medicare recommends these wellness visits once per year to help you and your care team stay ahead of potential health issues. These visits are designed to focus on prevention, allowing your provider to concentrate on managing your acute and chronic conditions during your regular appointments.  Please note that Annual Wellness Visits do not include a physical exam. Some assessments may be limited, especially if the visit was conducted virtually. If needed, we may recommend a separate in-person follow-up with your provider.  Ongoing Care Seeing your primary care provider every 3 to 6 months helps us  monitor your health and provide consistent, personalized care.   Referrals If a referral was made during today's visit and you haven't received any updates within two weeks, please contact the referred provider directly to check on the status.  Recommended Screenings:  Health Maintenance  Topic Date Due   Yearly kidney health urinalysis for diabetes  Never done   DTaP/Tdap/Td vaccine (1 - Tdap) Never done   Pneumococcal Vaccine for age over 80 (1 of 2 - PCV) Never done   COVID-19 Vaccine (3 - Moderna risk series) 09/08/2019   Flu Shot  Never done   Eye exam for diabetics  01/29/2024   Hemoglobin A1C  03/08/2024   Breast Cancer Screening  07/02/2024   Complete foot exam   08/05/2024   Yearly kidney function blood test for diabetes  09/06/2024   Medicare Annual Wellness Visit  01/11/2025   Colon Cancer Screening  06/26/2027   DEXA scan (bone density measurement)  Completed   Hepatitis C Screening  Completed   Zoster (Shingles) Vaccine  Completed   Meningitis B Vaccine  Aged Out       11/04/2022    9:25 AM  Advanced Directives  Does Patient Have a  Medical Advance Directive? No  Would patient like information on creating a medical advance directive? No - Patient declined   Advance Care Planning is important because it: Ensures you receive medical care that aligns with your values, goals, and preferences. Provides guidance to your family and loved ones, reducing the emotional burden of decision-making during critical moments.  Vision: Annual vision screenings are recommended for early detection of glaucoma, cataracts, and diabetic retinopathy. These exams can also reveal signs of chronic conditions such as diabetes and high blood pressure.  Dental: Annual dental screenings help detect early signs of oral cancer, gum disease, and other conditions linked to overall health, including heart disease and diabetes.

## 2024-01-12 NOTE — Progress Notes (Signed)
 Subjective:   Megan [name] is a 68 y.o. who presents for a Medicare Wellness preventive visit.  As a reminder, Annual Wellness Visits don't include a physical exam, and some assessments may be limited, especially if this visit is performed virtually. We may recommend an in-person follow-up visit with your provider if needed.  Visit Complete: Virtual I connected with  Megan [name] on 01/12/24 by a audio enabled telemedicine application and verified that I am speaking with the correct person using two identifiers.  Patient Location: Home  Provider Location: Office/Clinic  I discussed the limitations of evaluation and management by telemedicine. The patient expressed understanding and agreed to proceed.  Vital Signs: Because this visit was a virtual/telehealth visit, some criteria may be missing or patient reported. Any vitals not documented were not able to be obtained and vitals that have been documented are patient reported.  VideoDeclined- This patient declined Librarian, academic. Therefore the visit was completed with audio only.  Persons Participating in Visit: Patient.  AWV Questionnaire: Yes: Patient Medicare AWV questionnaire was completed by the patient on 01/05/24; I have confirmed that all information answered by patient is correct and no changes since this date.  Cardiac Risk Factors include: advanced age (>30men, >76 women);diabetes mellitus;dyslipidemia;hypertension;sedentary lifestyle     Objective:    Today's Vitals   01/12/24 1112  Weight: 184 lb (83.5 kg)  Height: 5' 6 (1.676 m)   Body mass index is 29.7 kg/m.     01/12/2024   11:18 AM 11/04/2022    9:25 AM 05/27/2021   11:59 AM 05/27/2021    6:54 AM 05/15/2021    9:39 AM 03/27/2021    8:29 AM 01/11/2021    4:18 PM  Advanced Directives  Does Patient Have a Medical Advance Directive? No No No No No No No  Would patient like information on creating a medical advance  directive?  No - Patient declined No - Patient declined No - Patient declined   No - Patient declined    Current Medications (verified) Outpatient Encounter Medications as of 01/12/2024  Medication Sig   acetaminophen  (TYLENOL ) 500 MG tablet Take 1,000 mg by mouth 2 (two) times daily.   albuterol  (VENTOLIN  HFA) 108 (90 Base) MCG/ACT inhaler Inhale 1-2 puffs into the lungs every 6 (six) hours as needed for wheezing or shortness of breath.   aspirin  81 MG chewable tablet Chew 1 tablet (81 mg total) by mouth in the morning and at bedtime.   atorvastatin  (LIPITOR) 40 MG tablet TAKE ONE TABLET BY MOUTH EVERY DAY   Biotin w/ Vitamins C & E (HAIR/SKIN/NAILS PO) Take 1 capsule by mouth in the morning.   BLACK CURRANT SEED OIL PO Take 1 capsule by mouth in the morning.   cholecalciferol (VITAMIN D3) 25 MCG (1000 UNIT) tablet Take 1,000 Units by mouth daily.   docusate sodium  (COLACE) 100 MG capsule Take 100 mg by mouth daily.   FARXIGA  10 MG TABS tablet TAKE ONE TABLET BY MOUTH DAILY BEFORE BREAKFAST   Iron , Ferrous Sulfate , 325 (65 Fe) MG TABS Take 1 pill by mouth on Monday, Wednesday, and Friday   JANUVIA  100 MG tablet TAKE ONE TABLET BY MOUTH DAILY   lisinopril  (ZESTRIL ) 20 MG tablet TAKE ONE TABLET BY MOUTH EVERY MORNING   Omega-3 Fatty Acids (FISH OIL PO) Take 1 capsule by mouth daily.   omeprazole  (PRILOSEC) 40 MG capsule TAKE ONE CAPSULE BY MOUTH EVERY MORNING   No facility-administered encounter medications on  file as of 01/12/2024.    Allergies (verified) Flavoring agent, Other, Peanut butter flavoring agent (non-screening), and Peanut-containing drug products   History: Past Medical History:  Diagnosis Date   Adopted    Allergy Peanuts   Anemia    Arthritis    Asthma    well controlled   Cirrhosis of liver (HCC) 2022   Diabetes mellitus without complication (HCC)    GERD (gastroesophageal reflux disease)    Hepatitis    per patient was diagnosed with cirrosis due to drinking  alcohol but stopped 09/16/20   Hypercholesteremia    Hyperparathyroidism    Hypertension    Past Surgical History:  Procedure Laterality Date   COLONOSCOPY WITH PROPOFOL  N/A 06/25/2020   Procedure: COLONOSCOPY WITH PROPOFOL ;  Surgeon: Therisa Bi, MD;  Location[facility] ENDOSCOPY;  Service: Endoscopy;  Laterality: N/A;   ESOPHAGOGASTRODUODENOSCOPY (EGD) WITH PROPOFOL  N/A 03/27/2021   Procedure: ESOPHAGOGASTRODUODENOSCOPY (EGD) WITH PROPOFOL ;  Surgeon: Unk Corinn Skiff, MD;  Location: [facility] ENDOSCOPY;  Service: Gastroenterology;  Laterality: N/A;   JOINT REPLACEMENT  01/11/21 and 05/27/21   Right and left hip   TOTAL HIP ARTHROPLASTY Left 01/11/2021   Procedure: TOTAL HIP ARTHROPLASTY ANTERIOR APPROACH;  Surgeon: Leora Lynwood SAUNDERS, MD;  Location: [facility] ORS;  Service: Orthopedics;  Laterality: Left;   TOTAL HIP ARTHROPLASTY Right 05/27/2021   Procedure: TOTAL HIP ARTHROPLASTY ANTERIOR APPROACH;  Surgeon: Leora Lynwood SAUNDERS, MD;  Location: [facility] ORS;  Service: Orthopedics;  Laterality: Right;   TUBAL LIGATION     Family History  Adopted: Yes  Family history unknown: Yes   Social History   Socioeconomic History   Marital status: Widowed    Spouse name: Not on file   Number of children: 2   Years of education: Not on file   Highest education level: 12th grade  Occupational History   Occupation: retired  Tobacco Use   Smoking status: Every Day    Current packs/day: 0.33    Average packs/day: 0.3 packs/day for 50.0 years (16.5 ttl pk-yrs)    Types: Cigarettes   Smokeless tobacco: Never  Vaping Use   Vaping status: Never Used  Substance and Sexual Activity   Alcohol use: Yes    Alcohol/week: 14.0 standard drinks of alcohol    Types: 14 Shots of liquor per week    Comment: occasionally. States twice a week   Drug use: Never   Sexual activity: Not Currently  Other Topics Concern   Not on file  Social History Narrative   05/17/20   From: [place] originally, moved in 2006   Living: with  husband (marital stress) and adult daughter   Work: retired air traffic controller      Family: 2 children - Nat Lease (lives with her) and Elsie ([place]) - 2 grandchildren - Julien (lives with him) and Warrick       Enjoys: playing games on phone      Exercise: not currently   Diet: low appetite, alcohol       Safety   Seat belts: Yes    Guns: No   Safe in relationships: does not feel safe with husband - has asked him to leave   Social Drivers of Corporate Investment Banker Strain: Low Risk  (01/05/2024)   Overall Financial Resource Strain (CARDIA)    Difficulty of Paying Living Expenses: Not hard at all  Food Insecurity: No Food Insecurity (01/05/2024)   Hunger Vital Sign    Worried About Programme Researcher, Broadcasting/film/video in  the Last Year: Never true    Ran Out of Food in the Last Year: Never true  Transportation Needs: No Transportation Needs (01/05/2024)   PRAPARE - Administrator, Civil Service (Medical): No    Lack of Transportation (Non-Medical): No  Physical Activity: Insufficiently Active (01/05/2024)   Exercise Vital Sign    Days of Exercise per Week: 1 day    Minutes of Exercise per Session: 30 min  Stress: No Stress Concern Present (01/05/2024)   [employer] of Occupational Health - Occupational Stress Questionnaire    Feeling of Stress: Not at all  Social Connections: Moderately Integrated (01/05/2024)   Social Connection and Isolation Panel    Frequency of Communication with Friends and Family: More than three times a week    Frequency of Social Gatherings with Friends and Family: More than three times a week    Attends Religious Services: More than 4 times per year    Active Member of [employer] or Organizations: Yes    Attends Banker Meetings: 1 to 4 times per year    Marital Status: Widowed    Tobacco Counseling Ready to quit: Not Answered Counseling given: Not Answered    Clinical Intake:  Pre-visit preparation completed: Yes  Pain  : No/denies pain     BMI - recorded: 29.7 Nutritional Status: BMI 25 -29 Overweight Nutritional Risks: None Diabetes: Yes CBG done?: No Did pt. bring in CBG monitor from home?: No  Lab Results  Component Value Date   HGBA1C 6.5 (A) 09/07/2023   HGBA1C 7.5 03/31/2023   HGBA1C 6.6 (A) 12/17/2022     How often do you need to have someone help you when you read instructions, pamphlets, or other written materials from your doctor or pharmacy?: 1 - Never  Interpreter Needed?: No  Comments: lives with daughter and son -in law Information entered by :: B.Carston Riedl,LPN   Activities of Daily Living     01/05/2024    9:34 AM 11/07/2023   12:06 PM  In your present state of health, do you have any difficulty performing the following activities:  Hearing? 0 0  Vision? 0 0  Difficulty concentrating or making decisions? 0 0  Walking or climbing stairs? 0 0  Dressing or bathing? 0 0  Doing errands, shopping? 0 0  Preparing Food and eating ? N N  Using the Toilet? N N  In the past six months, have you accidently leaked urine? N N  Do you have problems with loss of bowel control? N N  Managing your Medications? N N  Managing your Finances? N N  Housekeeping or managing your Housekeeping? N N    Patient Care Team: Wendee Lynwood HERO, NP as PCP - General (Nurse Practitioner)  I have updated your Care Teams any recent Medical Services you may have received from other providers in the past year.     Assessment:   This is a routine wellness examination for Megan [name].  Hearing/Vision screen Hearing Screening - Comments:: Patient denies any hearing difficulties.   Vision Screening - Comments:: Pt says their vision is good with glasses Cannot remember the Dr  :has appt in Dec   Goals Addressed             This Visit's Progress    COMPLETED: Patient Stated       10/03/2021, get shoulder looked at and get dental work done     Patient Stated   Not on track  01/12/24-Make healthy  food choices, increase activity and exercise.       Depression Screen     01/12/2024   11:16 AM 09/07/2023    1:28 PM 11/04/2022    9:18 AM 05/21/2022   11:10 AM 10/03/2021   11:36 AM 04/10/2021    9:45 AM 02/20/2020   11:25 AM  PHQ 2/9 Scores  PHQ - 2 Score 0 0 0 2 0 0 6  PHQ- 9 Score  0  6   16    Fall Risk     01/05/2024    9:34 AM 11/07/2023   12:06 PM 09/07/2023    1:29 PM 10/31/2022    2:58 PM 10/03/2021   11:35 AM  Fall Risk   Falls in the past year? 1 1 0 0 0  Number falls in past yr: 0 0 0 0 0  Injury with Fall? 0 0 0 0 0  Risk for fall due to : No Fall Risks  No Fall Risks No Fall Risks Medication side effect  Follow up Falls prevention discussed;Education provided  Falls evaluation completed Falls prevention discussed;Falls evaluation completed Falls evaluation completed;Education provided;Falls prevention discussed      Data saved with a previous flowsheet row definition    MEDICARE RISK AT HOME:  Medicare Risk at Home Any stairs in or around the home?: (Patient-Rptd) Yes If so, are there any without handrails?: (Patient-Rptd) No Home free of loose throw rugs in walkways, pet beds, electrical cords, etc?: (Patient-Rptd) Yes Adequate lighting in your home to reduce risk of falls?: (Patient-Rptd) Yes Life alert?: (Patient-Rptd) No Use of a cane, walker or w/c?: (Patient-Rptd) No Grab bars in the bathroom?: (Patient-Rptd) No Shower chair or bench in shower?: (Patient-Rptd) No Elevated toilet seat or a handicapped toilet?: (Patient-Rptd) Yes  TIMED UP AND GO:  Was the test performed?  No  Cognitive Function: 6CIT completed        01/12/2024   11:19 AM 11/04/2022    9:30 AM 10/03/2021   11:37 AM  6CIT Screen  What Year? 0 points 0 points 0 points  What month? 0 points 0 points 0 points  What time? 0 points 0 points 0 points  Count back from 20 0 points 0 points 0 points  Months in reverse 0 points 0 points 0 points  Repeat phrase 0 points 6 points 0 points   Total Score 0 points 6 points 0 points    Immunizations Immunization History  Administered Date(s) Administered   Moderna Sars-Covid-2 Vaccination 07/11/2019, 08/11/2019    Screening Tests Health Maintenance  Topic Date Due   Diabetic kidney evaluation - Urine ACR  Never done   DTaP/Tdap/Td (1 - Tdap) Never done   Pneumococcal Vaccine: 50+ Years (1 of 2 - PCV) Never done   COVID-19 Vaccine (3 - Moderna risk series) 09/08/2019   Influenza Vaccine  Never done   OPHTHALMOLOGY EXAM  01/29/2024   HEMOGLOBIN A1C  03/08/2024   Mammogram  07/02/2024   FOOT EXAM  08/05/2024   Diabetic kidney evaluation - eGFR measurement  09/06/2024   Medicare Annual Wellness (AWV)  01/11/2025   Colonoscopy  06/26/2027   DEXA SCAN  Completed   Hepatitis C Screening  Completed   Zoster Vaccines- Shingrix  Completed   Meningococcal B Vaccine  Aged Out    Health Maintenance Items Addressed: Has DM Eye exam in Dec 2025 Pt declines Influenza, Covid, PCV vaccines.  Additional Screening:  Vision Screening: Recommended annual ophthalmology exams for early  detection of glaucoma and other disorders of the eye. Is the patient up to date with their annual eye exam?  Yes  Who is the provider or what is the name of the office in which the patient attends annual eye exams? Doe snot remember but says she has an appt in Dec 2025  Dental Screening: Recommended annual dental exams for proper oral hygiene  Community Resource Referral / Chronic Care Management: CRR required this visit?  No   CCM required this visit?  No   Plan:    I have personally reviewed and noted the following in the patient's chart:   Medical and social history Use of alcohol, tobacco or illicit drugs  Current medications and supplements including opioid prescriptions. Patient is not currently taking opioid prescriptions. Functional ability and status Nutritional status Physical activity Advanced directives List of other  physicians Hospitalizations, surgeries, and ER visits in previous 12 months Vitals Screenings to include cognitive, depression, and falls Referrals and appointments  In addition, I have reviewed and discussed with patient certain preventive protocols, quality metrics, and best practice recommendations. A written personalized care plan for preventive services as well as general preventive health recommendations were provided to patient.   MARIAM HELBERT, LPN   [id number]   After Visit Summary: (MyChart) Due to this being a telephonic visit, the after visit summary with patients personalized plan was offered to patient via MyChart   Notes: Nothing significant to report at this time.

## 2024-01-15 ENCOUNTER — Encounter: Payer: Self-pay | Admitting: Podiatry

## 2024-01-15 NOTE — Progress Notes (Signed)
  Subjective:  Patient ID: Megan [name], female    DOB: 11-13-1955,  MRN: [id number]  Megan [name] presents to clinic today for at risk foot care. Pt has h/o NIDDM with PAD and painful porokeratotic lesion(s) b/l lower extremities and painful mycotic toenails that limit ambulation. Painful toenails interfere with ambulation. Aggravating factors include wearing enclosed shoe gear. Pain is relieved with periodic professional debridement. Painful porokeratotic lesions are aggravated when weightbearing with and without shoegear. Pain is relieved with periodic professional debridement.  Chief Complaint  Patient presents with   Toe Pain    A1c 6.5. NP Megan is her PCP . Last visit was in June.    New problem(s): None.   PCP is Megan Lynwood HERO, NP.  Allergies  Allergen Reactions   Flavoring Agent Anaphylaxis   Other Anaphylaxis   Peanut Butter Flavoring Agent (Non-Screening) Anaphylaxis   Peanut-Containing Drug Products Anaphylaxis    Review of Systems: Negative except as noted in the HPI.  Objective: No changes noted in today's physical examination. There were no vitals filed for this visit. Megan [name] is a pleasant 68 y.o. female in NAD. AAO x 3.  Vascular Examination: Capillary refill time immediate b/l. Vascular status intact b/l with faintly palpable pedal pulses. Pedal hair diminished b/l. No pain with calf compression b/l. Skin temperature gradient WNL b/l. No cyanosis or clubbing b/l. No ischemia or gangrene noted b/l. Mild varicosities b/l LE. Evidence of skin changes consistent with long term venous stasis BLE.  Neurological Examination: Sensation grossly intact b/l with 10 gram monofilament. Vibratory sensation intact b/l.   Dermatological Examination: Pedal skin with normal turgor, texture and tone b/l.  No open wounds. No interdigital macerations.   Toenails 1-5 b/l thick, discolored, elongated with subungual debris and pain on dorsal palpation.     Porokeratotic lesion(s) submet head 1 right foot, submet head 3 left foot, and submet head 4 right foot. No erythema, no edema, no drainage, no fluctuance.  Musculoskeletal Examination: Muscle strength 5/5 to all lower extremity muscle groups bilaterally. Plantarflexed metatarsal(s). Hammertoes 2-5 b/l.  Radiographs: None  Assessment/Plan: 1. Pain due to onychomycosis of nail   2. Porokeratosis   3. Diabetes mellitus with peripheral circulatory disorder, controlled (HCC)   Patient was evaluated and treated. All patient's and/or POA's questions/concerns addressed on today's visit. Discussed chronic venous insuffficiency and dispesned information regarding condition. Toenails 1-5 b/l debrided in length and girth without incident. Porokeratotic lesion(s) submet head 1 right foot, submet head 3 left foot, and submet head 4 right foot pared with sharp debridement without incident. Continue daily foot inspections and monitor blood glucose per PCP/Endocrinologist's recommendations. Continue soft, supportive shoe gear daily. Report any pedal injuries to medical professional. Call office if there are any questions/concerns.  Return in about 3 months (around 04/12/2024).  Delon LITTIE Merlin, DPM      [place] LOCATION: [address], [place] [address]                   Office [phone]   [facility] LOCATION: [address] [place], [place] [address] Office [phone]

## 2024-01-29 ENCOUNTER — Encounter: Payer: Self-pay | Admitting: Nurse Practitioner

## 2024-01-29 ENCOUNTER — Ambulatory Visit: Admitting: Nurse Practitioner

## 2024-01-29 VITALS — BP 126/70 | HR 91 | Temp 98.2°F | Ht 65.25 in | Wt 187.4 lb

## 2024-01-29 DIAGNOSIS — L84 Corns and callosities: Secondary | ICD-10-CM

## 2024-01-29 DIAGNOSIS — K7460 Unspecified cirrhosis of liver: Secondary | ICD-10-CM

## 2024-01-29 DIAGNOSIS — E782 Mixed hyperlipidemia: Secondary | ICD-10-CM | POA: Diagnosis not present

## 2024-01-29 DIAGNOSIS — Z0000 Encounter for general adult medical examination without abnormal findings: Secondary | ICD-10-CM | POA: Diagnosis not present

## 2024-01-29 DIAGNOSIS — D509 Iron deficiency anemia, unspecified: Secondary | ICD-10-CM | POA: Diagnosis not present

## 2024-01-29 DIAGNOSIS — I10 Essential (primary) hypertension: Secondary | ICD-10-CM

## 2024-01-29 DIAGNOSIS — E213 Hyperparathyroidism, unspecified: Secondary | ICD-10-CM

## 2024-01-29 DIAGNOSIS — Z126 Encounter for screening for malignant neoplasm of bladder: Secondary | ICD-10-CM

## 2024-01-29 DIAGNOSIS — E119 Type 2 diabetes mellitus without complications: Secondary | ICD-10-CM

## 2024-01-29 DIAGNOSIS — K219 Gastro-esophageal reflux disease without esophagitis: Secondary | ICD-10-CM

## 2024-01-29 DIAGNOSIS — Z1231 Encounter for screening mammogram for malignant neoplasm of breast: Secondary | ICD-10-CM

## 2024-01-29 DIAGNOSIS — Z122 Encounter for screening for malignant neoplasm of respiratory organs: Secondary | ICD-10-CM

## 2024-01-29 LAB — CBC WITH DIFFERENTIAL/PLATELET
Basophils Absolute: 0 K/uL (ref 0.0–0.1)
Basophils Relative: 0.7 % (ref 0.0–3.0)
Eosinophils Absolute: 0.1 K/uL (ref 0.0–0.7)
Eosinophils Relative: 1.5 % (ref 0.0–5.0)
HCT: 34.9 % — ABNORMAL LOW (ref 36.0–46.0)
Hemoglobin: 11.3 g/dL — ABNORMAL LOW (ref 12.0–15.0)
Lymphocytes Relative: 27.6 % (ref 12.0–46.0)
Lymphs Abs: 1.4 K/uL (ref 0.7–4.0)
MCHC: 32.4 g/dL (ref 30.0–36.0)
MCV: 97.1 fl (ref 78.0–100.0)
Monocytes Absolute: 0.4 K/uL (ref 0.1–1.0)
Monocytes Relative: 8.1 % (ref 3.0–12.0)
Neutro Abs: 3.1 K/uL (ref 1.4–7.7)
Neutrophils Relative %: 62.1 % (ref 43.0–77.0)
Platelets: 157 K/uL (ref 150.0–400.0)
RBC: 3.6 Mil/uL — ABNORMAL LOW (ref 3.87–5.11)
RDW: 17.1 % — ABNORMAL HIGH (ref 11.5–15.5)
WBC: 5.1 K/uL (ref 4.0–10.5)

## 2024-01-29 LAB — COMPREHENSIVE METABOLIC PANEL WITH GFR
ALT: 35 U/L (ref 0–35)
AST: 82 U/L — ABNORMAL HIGH (ref 0–37)
Albumin: 4.2 g/dL (ref 3.5–5.2)
Alkaline Phosphatase: 307 U/L — ABNORMAL HIGH (ref 39–117)
BUN: 14 mg/dL (ref 6–23)
CO2: 26 meq/L (ref 19–32)
Calcium: 11.2 mg/dL — ABNORMAL HIGH (ref 8.4–10.5)
Chloride: 103 meq/L (ref 96–112)
Creatinine, Ser: 0.89 mg/dL (ref 0.40–1.20)
GFR: 66.18 mL/min (ref >60.00)
Glucose, Bld: 125 mg/dL — ABNORMAL HIGH (ref 70–99)
Potassium: 4 meq/L (ref 3.5–5.1)
Sodium: 138 meq/L (ref 135–145)
Total Bilirubin: 0.7 mg/dL (ref 0.2–1.2)
Total Protein: 7.8 g/dL (ref 6.0–8.3)

## 2024-01-29 LAB — URINALYSIS, MICROSCOPIC ONLY

## 2024-01-29 LAB — IBC + FERRITIN
Ferritin: 28.4 ng/mL (ref 10.0–291.0)
Iron: 28 ug/dL — ABNORMAL LOW (ref 42–145)
Saturation Ratios: 5.5 % — ABNORMAL LOW (ref 20.0–50.0)
TIBC: 508.2 ug/dL — ABNORMAL HIGH (ref 250.0–450.0)
Transferrin: 363 mg/dL — ABNORMAL HIGH (ref 212.0–360.0)

## 2024-01-29 LAB — POCT GLYCOSYLATED HEMOGLOBIN (HGB A1C): Hemoglobin A1C: 6.3 % — AB (ref 4.0–5.6)

## 2024-01-29 LAB — LIPID PANEL
Cholesterol: 194 mg/dL (ref 0–200)
HDL: 72.4 mg/dL (ref >39.00)
LDL Cholesterol: 85 mg/dL (ref 0–99)
NonHDL: 121.2
Total CHOL/HDL Ratio: 3
Triglycerides: 180 mg/dL — ABNORMAL HIGH (ref 0.0–149.0)
VLDL: 36 mg/dL (ref 0.0–40.0)

## 2024-01-29 LAB — TSH: TSH: 0.71 u[IU]/mL (ref 0.35–5.50)

## 2024-01-29 LAB — VITAMIN D 25 HYDROXY (VIT D DEFICIENCY, FRACTURES): VITD: 57.16 ng/mL (ref 30.00–100.00)

## 2024-01-29 LAB — MICROALBUMIN / CREATININE URINE RATIO
Creatinine,U: 77.7 mg/dL
Microalb Creat Ratio: 47.7 mg/g — ABNORMAL HIGH (ref 0.0–30.0)
Microalb, Ur: 3.7 mg/dL — ABNORMAL HIGH (ref 0.0–1.9)

## 2024-01-29 NOTE — Assessment & Plan Note (Signed)
 History of the same.  Patient has been referred to endocrine in the past and declines to follow-up we will check TSH along with PTH today

## 2024-01-29 NOTE — Progress Notes (Signed)
 Established Patient Office Visit  Subjective   Patient ID: Megan [name], female    DOB: [birth year]  Age: 68 y.o. MRN: [id number]  Chief Complaint  Patient presents with   Annual Exam    Declines vaccines      HPI 6.3  HTN: Patient currently maintained on lisinopril  20 mg daily.  DM2: Currently maintained on Januvia  100 mg daily and Jardiance 10 mg daily  HLD: Maintained on atorvastatin  40 mg daily  Cirrhosis of liver: Has been followed by GI in the past but not currently.  Was scheduled for liver biopsy but patient never followed through due to difficulty scheduling the biopsy.  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  GERD: Currently maintained on omeprazole  40 mg daily  for complete physical and follow up of chronic conditions.  Immunizations: -Tetanus: Completed in refused -Influenza: Refused -Shingles: Completed Shingrix series -Pneumonia: Refused  Diet: Fair diet. She is eating 3 meals a day. She has cut down on soda. Ice. Water . And coffee Exercise: No regular exercise.  Eye exam: Completes annually. Due this mont.  Dental exam: Dentures     Colonoscopy: Completed in 06/25/2020, repeat 7 years.  Patient due 2029 Lung Cancer Screening: Ambulatory referral placed today  Pap smear: Aged out?  12/18/2022  Mammogram: 07/03/2022, repeat 1 year patient overdue  DEXA: 12/18/2022, Patient due 2026.  Patient's bone health was normal  Sleep: at least 4 hours of sleep that she feels rested. She will nap   Advance directive: no legal documents but has discussed with her kids (daughter and son).      Review of Systems  Constitutional:  Negative for chills and fever.  Respiratory:  Negative for shortness of breath.   Cardiovascular:  Negative for chest pain and leg swelling.  Gastrointestinal:  Negative for abdominal pain, blood in stool, constipation, diarrhea,  nausea and vomiting.  Genitourinary:  Negative for dysuria and hematuria.  Neurological:  Negative for tingling and headaches.  Psychiatric/Behavioral:  Negative for hallucinations and suicidal ideas.       Objective:     BP 126/70   Pulse 91   Temp 98.2 F (36.8 C) (Oral)   Ht 5' 5.25 (1.657 m)   Wt 187 lb 6.4 oz (85 kg)   SpO2 93%   BMI 30.95 kg/m    Physical Exam Vitals and nursing note reviewed.  Constitutional:      Appearance: Normal appearance.  HENT:     Right Ear: Tympanic membrane, ear canal and external ear normal.     Left Ear: Tympanic membrane, ear canal and external ear normal.     Mouth/Throat:     Mouth: Mucous membranes are moist.     Pharynx: Oropharynx is clear.  Eyes:     Extraocular Movements: Extraocular movements intact.     Pupils: Pupils are equal, round, and reactive to light.  Neck:     Thyroid : Thyromegaly present. No thyroid  tenderness.  Cardiovascular:     Rate and Rhythm: Normal rate and regular rhythm.     Pulses: Normal pulses.     Heart sounds: Normal heart sounds.  Pulmonary:     Effort: Pulmonary effort is normal.     Breath sounds: Normal breath sounds.  Abdominal:     General: Bowel sounds are normal. There is no distension.     Palpations: There is no mass.     Tenderness: There is no  abdominal tenderness.     Hernia: No hernia is present.  Musculoskeletal:     Right lower leg: No edema.     Left lower leg: No edema.  Lymphadenopathy:     Cervical: No cervical adenopathy.  Skin:    General: Skin is warm.  Neurological:     General: No focal deficit present.     Mental Status: She is alert.     Deep Tendon Reflexes:     Reflex Scores:      Bicep reflexes are 2+ on the right side and 2+ on the left side.      Patellar reflexes are 2+ on the right side and 2+ on the left side.    Comments: Bilateral upper and lower extremity strength 5/5  Psychiatric:        Mood and Affect: Mood normal.        Behavior: Behavior  normal.        Thought Content: Thought content normal.        Judgment: Judgment normal.    Title   Diabetic Foot Exam - detailed Is there a history of foot ulcer?: No Is there a foot ulcer now?: No Is there swelling?: No Is there elevated skin temperature?: No Is there abnormal foot shape?: No Is there a claw toe deformity?: No Are the toenails long?: No Are the toenails thick?: No Are the toenails ingrown?: No Pulse Foot Exam completed.: Yes   Right Posterior Tibialis: Present Left posterior Tibialis: Present   Right Dorsalis Pedis: Present Left Dorsalis Pedis: Present     Sensory Foot Exam Completed.: Yes Semmes-Weinstein Monofilament Test + means has sensation and - means no sensation      Image components are not supported.   Image components are not supported. Image components are not supported.  [place] Comments All 10 sites tested sensation intact bilaterally  Callus to bilateral lateral plantar surfaces       Results for orders placed or performed in visit on 01/29/24  POCT glycosylated hemoglobin (Hb A1C)  Result Value Ref Range   Hemoglobin A1C 6.3 (A) 4.0 - 5.6 %   HbA1c POC (<> result, manual entry)     HbA1c, POC (prediabetic range)     HbA1c, POC (controlled diabetic range)        The 10-year ASCVD risk score (Arnett DK, et al., 2019) is: 43.4%    Assessment & Plan:   Problem List Items Addressed This Visit       Cardiovascular and Mediastinum   Essential hypertension   Patient currently maintained on lisinopril  20 mg daily.  Tolerating medication well blood pressure controlled continue medication as prescribed      Relevant Orders   CBC with Differential/Platelet   Comprehensive metabolic panel with GFR   Microalbumin / creatinine urine ratio   TSH   Lipid panel     Digestive   Gastroesophageal reflux disease without esophagitis   History of the same with a history of alcohol abuse and cirrhosis.  Will continue  patient on omeprazole  40 mg daily.      Relevant Medications   GAVILYTE-N WITH FLAVOR PACK 420 g solution   Cirrhosis of liver without ascites (HCC)   History of the same.  Patient most recently had ultrasound of abdomen done in the beginning of October.  Did review shows cirrhotic changes of the liver.  Encourage patient to follow-up with GI has been referred to [place] [facility] in the past.  Endocrine   Hyperparathyroidism   History of the same.  Patient has been referred to endocrine in the past and declines to follow-up we will check TSH along with PTH today      Relevant Orders   Parathyroid hormone, intact (no Ca)   Type 2 diabetes mellitus without complication, without long-term current use of insulin (HCC)   Patient currently maintained on Januvia  100 mg daily and Jardiance 10 mg daily.  Does not check glucose at home.  A1c is 6.3%.  Continue medication as prescribed      Relevant Orders   POCT glycosylated hemoglobin (Hb A1C) (Completed)   CBC with Differential/Platelet   Comprehensive metabolic panel with GFR   Microalbumin / creatinine urine ratio   Lipid panel     Musculoskeletal and Integument   Corns and callus   History of same bilateral plantar surfaces stable no opening of skin        Other   Mixed hyperlipidemia   History of the same.  Patient currently maintained on atorvastatin  40 mg daily.  Pending lipid panel      Relevant Orders   Lipid panel   Iron  deficiency anemia   Patient takes ferrous sulfate  325 thrice weekly.  Pending CBC and iron  cascade      Relevant Medications   ferrous gluconate  (FERGON) 240 (27 FE) MG tablet   Other Relevant Orders   IBC + Ferritin   Preventative health care - Primary   Discussed age-appropriate immunizations and screening exams.  Did review patient's personal, surgical, social, family histories.  Patient is up-to-date with all age-appropriate vaccinations she would like.  Patient declined tetanus, flu,  pneumonia vaccine today.  Patient is up-to-date on CRC screening.  Order placed today for mammogram.  Patient given information to call and set up at her convenience.  Patient no longer wants to proceed with cervical cancer screening due to her age.  Patient was given information at discharge about preventative healthcare maintenance with anticipatory guidance.      Relevant Orders   CBC with Differential/Platelet   Comprehensive metabolic panel with GFR   TSH   Other Visit Diagnoses       Screening for lung cancer       Relevant Orders   Ambulatory Referral Lung Cancer Screening [facility]     Screening mammogram for breast cancer       Relevant Orders   MM 3D SCREENING MAMMOGRAM BILATERAL BREAST     Screening for bladder cancer       Relevant Orders   Urine Microscopic       Return in about 6 months (around 07/28/2024) for DM recheck.    Adina Crandall, NP

## 2024-01-29 NOTE — Assessment & Plan Note (Signed)
 History of the same.  Patient currently maintained on atorvastatin  40 mg daily.  Pending lipid panel

## 2024-01-29 NOTE — Assessment & Plan Note (Signed)
 Patient currently maintained on lisinopril  20 mg daily.  Tolerating medication well blood pressure controlled continue medication as prescribed

## 2024-01-29 NOTE — Assessment & Plan Note (Signed)
 Patient takes ferrous sulfate  325 thrice weekly.  Pending CBC and iron  cascade

## 2024-01-29 NOTE — Assessment & Plan Note (Signed)
 Discussed age-appropriate immunizations and screening exams.  Did review patient's personal, surgical, social, family histories.  Patient is up-to-date with all age-appropriate vaccinations she would like.  Patient declined tetanus, flu, pneumonia vaccine today.  Patient is up-to-date on CRC screening.  Order placed today for mammogram.  Patient given information to call and set up at her convenience.  Patient no longer wants to proceed with cervical cancer screening due to her age.  Patient was given information at discharge about preventative healthcare maintenance with anticipatory guidance.

## 2024-01-29 NOTE — Assessment & Plan Note (Signed)
 History of the same with a history of alcohol abuse and cirrhosis.  Will continue patient on omeprazole  40 mg daily.

## 2024-01-29 NOTE — Patient Instructions (Signed)
 Nice to see you today  I will be in touch with the labs once I have htem  Follow up with me in 6 months  Call and schedule your mammogram at   [facility] [address] ( on hospital grounds) [place], [place]  [phone]

## 2024-01-29 NOTE — Assessment & Plan Note (Signed)
 History of same bilateral plantar surfaces stable no opening of skin

## 2024-01-29 NOTE — Assessment & Plan Note (Signed)
 Patient currently maintained on Januvia  100 mg daily and Jardiance 10 mg daily.  Does not check glucose at home.  A1c is 6.3%.  Continue medication as prescribed

## 2024-01-29 NOTE — Assessment & Plan Note (Signed)
 History of the same.  Patient most recently had ultrasound of abdomen done in the beginning of October.  Did review shows cirrhotic changes of the liver.  Encourage patient to follow-up with GI has been referred to [facility] in the past.

## 2024-01-30 LAB — PARATHYROID HORMONE, INTACT (NO CA): PTH: 68 pg/mL (ref 16–77)

## 2024-02-01 ENCOUNTER — Other Ambulatory Visit: Payer: Self-pay | Admitting: Nurse Practitioner

## 2024-02-01 DIAGNOSIS — D509 Iron deficiency anemia, unspecified: Secondary | ICD-10-CM

## 2024-02-01 DIAGNOSIS — I10 Essential (primary) hypertension: Secondary | ICD-10-CM

## 2024-02-01 DIAGNOSIS — K219 Gastro-esophageal reflux disease without esophagitis: Secondary | ICD-10-CM

## 2024-02-01 DIAGNOSIS — E119 Type 2 diabetes mellitus without complications: Secondary | ICD-10-CM

## 2024-02-02 ENCOUNTER — Ambulatory Visit: Payer: Self-pay | Admitting: Nurse Practitioner

## 2024-04-08 ENCOUNTER — Encounter: Payer: Self-pay | Admitting: Podiatry

## 2024-04-08 ENCOUNTER — Ambulatory Visit: Admitting: Podiatry

## 2024-04-08 DIAGNOSIS — M79609 Pain in unspecified limb: Secondary | ICD-10-CM

## 2024-04-08 DIAGNOSIS — Q828 Other specified congenital malformations of skin: Secondary | ICD-10-CM | POA: Diagnosis not present

## 2024-04-08 DIAGNOSIS — E1151 Type 2 diabetes mellitus with diabetic peripheral angiopathy without gangrene: Secondary | ICD-10-CM | POA: Diagnosis not present

## 2024-04-08 DIAGNOSIS — B351 Tinea unguium: Secondary | ICD-10-CM

## 2024-04-12 NOTE — Progress Notes (Signed)
"  °  Subjective:  Patient ID: Megan [name], female    DOB: 1955-12-20,  MRN: [id number]  Megan [name] presents to clinic today for at risk foot care. Pt has h/o NIDDM with PAD and painful porokeratotic lesion(s) of both feet and painful mycotic toenails that limit ambulation. Painful toenails interfere with ambulation. Aggravating factors include wearing enclosed shoe gear. Pain is relieved with periodic professional debridement. Painful porokeratotic lesions are aggravated when weightbearing with and without shoegear. Pain is relieved with periodic professional debridement.  Chief Complaint  Patient presents with   Diabetes    A1c 6.0 She saw NP Cable in Nov.   New problem(s): None.   PCP is Wendee Lynwood HERO, NP.  Allergies[1]  Review of Systems: Negative except as noted in the HPI.  Objective: No changes noted in today's physical examination. There were no vitals filed for this visit. Megan [name] is a pleasant 69 y.o. female in NAD. AAO x 3.  Vascular Examination: Capillary refill time immediate b/l. Vascular status intact b/l with faintly palpable pedal pulses. Pedal hair diminished b/l. No pain with calf compression b/l. Skin temperature gradient WNL b/l. No cyanosis or clubbing b/l. No ischemia or gangrene noted b/l. Mild varicosities b/l LE. Evidence of skin changes consistent with long term venous stasis BLE.  Neurological Examination: Sensation grossly intact b/l with 10 gram monofilament. Vibratory sensation intact b/l.   Dermatological Examination: Pedal skin with normal turgor, texture and tone b/l.  No open wounds. No interdigital macerations.   Toenails 1-5 b/l thick, discolored, elongated with subungual debris and pain on dorsal palpation.    Porokeratotic lesion(s) submet head 1 right foot, submet head 3 left foot, and submet head 4 right foot. No erythema, no edema, no drainage, no fluctuance.  Musculoskeletal Examination: Muscle strength 5/5 to all  lower extremity muscle groups bilaterally. Plantarflexed metatarsal(s). Hammertoes 2-5 b/l.  Radiographs: None  Assessment/Plan: 1. Pain due to onychomycosis of nail   2. Porokeratosis   3. Diabetes mellitus with peripheral circulatory disorder, controlled [facility])    Consent given for treatment. Patient examined. All patient's and/or POA's questions/concerns addressed on today's visit. Mycotic toenails 1-5 b/l debrided in length and girth without incident. Porokeratotic lesion(s) submet head 1 right foot, submet head 3 left foot, and submet head 4 right foot pared and enucleated with sharp debridement without incident.Continue daily foot inspections and monitor blood glucose per PCP/Endocrinologist's recommendations. Continue soft, supportive shoe gear daily. Report any pedal injuries to medical professional. Call office if there are any quesitons/concerns. -Patient/POA to call should there be question/concern in the interim.   Return in about 9 weeks (around 06/10/2024).  Delon LITTIE Merlin, DPM      [place] LOCATION: [address], [place] [address]                   Office [phone]   [place] LOCATION: [address] [place], [place] [address] Office [phone]     [1]  Allergies Allergen Reactions   Flavoring Agent Anaphylaxis   Other Anaphylaxis   Peanut Butter Flavoring Agent (Non-Screening) Anaphylaxis   Peanut-Containing Drug Products Anaphylaxis   "

## 2024-06-10 ENCOUNTER — Ambulatory Visit: Admitting: Podiatry

## 2024-07-28 ENCOUNTER — Ambulatory Visit: Admitting: Nurse Practitioner

## 2025-01-12 ENCOUNTER — Ambulatory Visit

## 2025-01-13 ENCOUNTER — Ambulatory Visit
